# Patient Record
Sex: Female | Born: 1945 | Race: Black or African American | Hispanic: No | Marital: Married | Smoking: Never smoker
Health system: Southern US, Community
[De-identification: ages and names within clinical notes are randomized; demographics above are authoritative.]

## PROBLEM LIST (undated history)

## (undated) DIAGNOSIS — M545 Low back pain, unspecified: Secondary | ICD-10-CM

## (undated) DIAGNOSIS — K219 Gastro-esophageal reflux disease without esophagitis: Secondary | ICD-10-CM

## (undated) DIAGNOSIS — G2581 Restless legs syndrome: Secondary | ICD-10-CM

## (undated) DIAGNOSIS — E7800 Pure hypercholesterolemia, unspecified: Secondary | ICD-10-CM

## (undated) DIAGNOSIS — G4730 Sleep apnea, unspecified: Secondary | ICD-10-CM

## (undated) DIAGNOSIS — H409 Unspecified glaucoma: Secondary | ICD-10-CM

## (undated) DIAGNOSIS — R51 Headache: Secondary | ICD-10-CM

## (undated) DIAGNOSIS — M1990 Unspecified osteoarthritis, unspecified site: Secondary | ICD-10-CM

## (undated) DIAGNOSIS — R519 Headache, unspecified: Secondary | ICD-10-CM

## (undated) DIAGNOSIS — E669 Obesity, unspecified: Secondary | ICD-10-CM

## (undated) DIAGNOSIS — G4700 Insomnia, unspecified: Secondary | ICD-10-CM

## (undated) DIAGNOSIS — K635 Polyp of colon: Secondary | ICD-10-CM

## (undated) DIAGNOSIS — R7303 Prediabetes: Secondary | ICD-10-CM

## (undated) DIAGNOSIS — E039 Hypothyroidism, unspecified: Secondary | ICD-10-CM

## (undated) HISTORY — DX: Low back pain, unspecified: M54.50

## (undated) HISTORY — PX: BUNIONECTOMY: SHX129

## (undated) HISTORY — DX: Hypothyroidism, unspecified: E03.9

## (undated) HISTORY — PX: ANTERIOR CRUCIATE LIGAMENT REPAIR: SHX115

## (undated) HISTORY — PX: BREAST LUMPECTOMY: SHX2

## (undated) HISTORY — PX: TUBAL LIGATION: SHX77

## (undated) HISTORY — DX: Obesity, unspecified: E66.9

## (undated) HISTORY — PX: VAGINAL HYSTERECTOMY: SUR661

## (undated) HISTORY — DX: Unspecified osteoarthritis, unspecified site: M19.90

## (undated) HISTORY — PX: HIP SURGERY: SHX245

## (undated) HISTORY — DX: Polyp of colon: K63.5

## (undated) HISTORY — DX: Insomnia, unspecified: G47.00

## (undated) HISTORY — PX: THYROIDECTOMY: SHX17

## (undated) HISTORY — DX: Unspecified glaucoma: H40.9

## (undated) HISTORY — DX: Pure hypercholesterolemia, unspecified: E78.00

## (undated) HISTORY — PX: KNEE SURGERY: SHX244

## (undated) HISTORY — PX: TONSILLECTOMY: SUR1361

---
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
## 1979-03-26 HISTORY — PX: VAGINAL HYSTERECTOMY: SUR661

## 1997-12-21 ENCOUNTER — Ambulatory Visit (HOSPITAL_BASED_OUTPATIENT_CLINIC_OR_DEPARTMENT_OTHER): Admission: RE | Admit: 1997-12-21 | Discharge: 1997-12-21 | Payer: Self-pay | Admitting: Surgery

## 1999-01-25 ENCOUNTER — Encounter: Payer: Self-pay | Admitting: Internal Medicine

## 1999-01-25 ENCOUNTER — Encounter: Admission: RE | Admit: 1999-01-25 | Discharge: 1999-01-25 | Payer: Self-pay | Admitting: Family Medicine

## 1999-02-07 ENCOUNTER — Encounter: Admission: RE | Admit: 1999-02-07 | Discharge: 1999-02-07 | Payer: Self-pay | Admitting: Internal Medicine

## 1999-02-07 ENCOUNTER — Encounter: Payer: Self-pay | Admitting: Internal Medicine

## 2000-01-29 ENCOUNTER — Encounter: Payer: Self-pay | Admitting: Internal Medicine

## 2000-01-29 ENCOUNTER — Encounter: Admission: RE | Admit: 2000-01-29 | Discharge: 2000-01-29 | Payer: Self-pay | Admitting: Internal Medicine

## 2000-12-04 ENCOUNTER — Encounter: Admission: RE | Admit: 2000-12-04 | Discharge: 2000-12-04 | Payer: Self-pay | Admitting: Internal Medicine

## 2000-12-04 ENCOUNTER — Encounter: Payer: Self-pay | Admitting: Internal Medicine

## 2001-01-13 ENCOUNTER — Encounter: Payer: Self-pay | Admitting: Endocrinology

## 2001-01-13 ENCOUNTER — Encounter: Admission: RE | Admit: 2001-01-13 | Discharge: 2001-01-13 | Payer: Self-pay | Admitting: Endocrinology

## 2001-01-27 ENCOUNTER — Other Ambulatory Visit: Admission: RE | Admit: 2001-01-27 | Discharge: 2001-01-27 | Payer: Self-pay | Admitting: Endocrinology

## 2001-02-16 ENCOUNTER — Encounter: Admission: RE | Admit: 2001-02-16 | Discharge: 2001-02-16 | Payer: Self-pay | Admitting: Internal Medicine

## 2001-02-16 ENCOUNTER — Encounter: Payer: Self-pay | Admitting: Internal Medicine

## 2002-02-26 ENCOUNTER — Encounter: Admission: RE | Admit: 2002-02-26 | Discharge: 2002-02-26 | Payer: Self-pay | Admitting: Internal Medicine

## 2002-02-26 ENCOUNTER — Encounter: Payer: Self-pay | Admitting: Internal Medicine

## 2003-01-10 ENCOUNTER — Encounter: Payer: Self-pay | Admitting: Endocrinology

## 2003-01-10 ENCOUNTER — Encounter: Admission: RE | Admit: 2003-01-10 | Discharge: 2003-01-10 | Payer: Self-pay | Admitting: Endocrinology

## 2003-01-29 ENCOUNTER — Encounter: Admission: RE | Admit: 2003-01-29 | Discharge: 2003-01-29 | Payer: Self-pay | Admitting: Orthopedic Surgery

## 2003-02-28 ENCOUNTER — Encounter: Admission: RE | Admit: 2003-02-28 | Discharge: 2003-02-28 | Payer: Self-pay | Admitting: Internal Medicine

## 2004-01-23 ENCOUNTER — Encounter (INDEPENDENT_AMBULATORY_CARE_PROVIDER_SITE_OTHER): Payer: Self-pay | Admitting: Specialist

## 2004-01-23 ENCOUNTER — Ambulatory Visit (HOSPITAL_COMMUNITY): Admission: RE | Admit: 2004-01-23 | Discharge: 2004-01-23 | Payer: Self-pay | Admitting: Gastroenterology

## 2004-03-05 ENCOUNTER — Encounter: Admission: RE | Admit: 2004-03-05 | Discharge: 2004-03-05 | Payer: Self-pay | Admitting: Internal Medicine

## 2005-03-07 ENCOUNTER — Encounter: Admission: RE | Admit: 2005-03-07 | Discharge: 2005-03-07 | Payer: Self-pay | Admitting: Internal Medicine

## 2005-11-09 ENCOUNTER — Encounter: Admission: RE | Admit: 2005-11-09 | Discharge: 2005-11-09 | Payer: Self-pay | Admitting: Orthopedic Surgery

## 2005-11-22 ENCOUNTER — Ambulatory Visit (HOSPITAL_COMMUNITY): Admission: RE | Admit: 2005-11-22 | Discharge: 2005-11-22 | Payer: Self-pay | Admitting: Interventional Cardiology

## 2006-01-09 ENCOUNTER — Ambulatory Visit (HOSPITAL_BASED_OUTPATIENT_CLINIC_OR_DEPARTMENT_OTHER): Admission: RE | Admit: 2006-01-09 | Discharge: 2006-01-09 | Payer: Self-pay | Admitting: Orthopedic Surgery

## 2006-01-29 ENCOUNTER — Encounter: Admission: RE | Admit: 2006-01-29 | Discharge: 2006-01-29 | Payer: Self-pay | Admitting: Orthopedic Surgery

## 2006-03-11 ENCOUNTER — Encounter: Admission: RE | Admit: 2006-03-11 | Discharge: 2006-03-11 | Payer: Self-pay | Admitting: Internal Medicine

## 2006-12-01 ENCOUNTER — Ambulatory Visit: Payer: Self-pay | Admitting: Surgery

## 2006-12-04 ENCOUNTER — Encounter: Admission: RE | Admit: 2006-12-04 | Discharge: 2006-12-04 | Payer: Self-pay | Admitting: Endocrinology

## 2007-02-12 ENCOUNTER — Encounter (INDEPENDENT_AMBULATORY_CARE_PROVIDER_SITE_OTHER): Payer: Self-pay | Admitting: Interventional Radiology

## 2007-02-12 ENCOUNTER — Other Ambulatory Visit: Admission: RE | Admit: 2007-02-12 | Discharge: 2007-02-12 | Payer: Self-pay | Admitting: Interventional Radiology

## 2007-02-12 ENCOUNTER — Encounter: Admission: RE | Admit: 2007-02-12 | Discharge: 2007-02-12 | Payer: Self-pay | Admitting: Endocrinology

## 2007-03-13 ENCOUNTER — Encounter: Admission: RE | Admit: 2007-03-13 | Discharge: 2007-03-13 | Payer: Self-pay | Admitting: Internal Medicine

## 2008-03-14 ENCOUNTER — Encounter: Admission: RE | Admit: 2008-03-14 | Discharge: 2008-03-14 | Payer: Self-pay | Admitting: Internal Medicine

## 2008-12-19 ENCOUNTER — Encounter: Admission: RE | Admit: 2008-12-19 | Discharge: 2008-12-19 | Payer: Self-pay | Admitting: Endocrinology

## 2009-03-15 ENCOUNTER — Encounter: Admission: RE | Admit: 2009-03-15 | Discharge: 2009-03-15 | Payer: Self-pay | Admitting: Internal Medicine

## 2009-05-01 ENCOUNTER — Encounter (INDEPENDENT_AMBULATORY_CARE_PROVIDER_SITE_OTHER): Payer: Self-pay | Admitting: Surgery

## 2009-05-01 ENCOUNTER — Observation Stay (HOSPITAL_COMMUNITY): Admission: RE | Admit: 2009-05-01 | Discharge: 2009-05-02 | Payer: Self-pay | Admitting: Surgery

## 2010-03-15 ENCOUNTER — Encounter
Admission: RE | Admit: 2010-03-15 | Discharge: 2010-03-15 | Payer: Self-pay | Source: Home / Self Care | Attending: Orthopedic Surgery | Admitting: Orthopedic Surgery

## 2010-03-16 ENCOUNTER — Encounter
Admission: RE | Admit: 2010-03-16 | Discharge: 2010-03-16 | Payer: Self-pay | Source: Home / Self Care | Attending: Internal Medicine | Admitting: Internal Medicine

## 2010-03-25 HISTORY — PX: BUNIONECTOMY: SHX129

## 2010-05-25 ENCOUNTER — Other Ambulatory Visit: Payer: Self-pay | Admitting: Orthopaedic Surgery

## 2010-05-25 DIAGNOSIS — M549 Dorsalgia, unspecified: Secondary | ICD-10-CM

## 2010-05-29 ENCOUNTER — Ambulatory Visit
Admission: RE | Admit: 2010-05-29 | Discharge: 2010-05-29 | Disposition: A | Discharge status: Home / Self Care | Payer: MEDICARE | Source: Ambulatory Visit | Attending: Orthopaedic Surgery | Admitting: Orthopaedic Surgery

## 2010-05-29 DIAGNOSIS — M549 Dorsalgia, unspecified: Secondary | ICD-10-CM

## 2010-06-14 LAB — BASIC METABOLIC PANEL
BUN: 11 mg/dL (ref 6–23)
CO2: 28 mEq/L (ref 19–32)
Calcium: 10 mg/dL (ref 8.4–10.5)
Chloride: 105 mEq/L (ref 96–112)
Creatinine, Ser: 0.68 mg/dL (ref 0.4–1.2)
GFR calc Af Amer: >60 mL/min (ref >60)
GFR calc non Af Amer: >60 mL/min (ref >60)
Glucose, Bld: 89 mg/dL (ref 70–99)
Potassium: 4.5 mEq/L (ref 3.5–5.1)
Sodium: 139 mEq/L (ref 135–145)

## 2010-06-14 LAB — CBC
HCT: 43.1 % (ref 36.0–46.0)
Hemoglobin: 14.4 g/dL (ref 12.0–15.0)
MCHC: 33.4 g/dL (ref 30.0–36.0)
MCV: 86.8 fL (ref 78.0–100.0)
Platelets: 235 10*3/uL (ref 150–400)
RBC: 4.96 MIL/uL (ref 3.87–5.11)
RDW: 13.7 % (ref 11.5–15.5)
WBC: 5.7 10*3/uL (ref 4.0–10.5)

## 2010-06-14 LAB — DIFFERENTIAL
Basophils Absolute: 0 10*3/uL (ref 0.0–0.1)
Basophils Relative: 1 % (ref 0–1)
Eosinophils Absolute: 0.2 10*3/uL (ref 0.0–0.7)
Eosinophils Relative: 3 % (ref 0–5)
Lymphocytes Relative: 20 % (ref 12–46)
Lymphs Abs: 1.2 10*3/uL (ref 0.7–4.0)
Monocytes Absolute: 0.4 10*3/uL (ref 0.1–1.0)
Monocytes Relative: 7 % (ref 3–12)
Neutro Abs: 3.9 10*3/uL (ref 1.7–7.7)
Neutrophils Relative %: 70 % (ref 43–77)

## 2010-06-14 LAB — URINALYSIS, ROUTINE W REFLEX MICROSCOPIC
Bilirubin Urine: NEGATIVE
Glucose, UA: NEGATIVE mg/dL
Hgb urine dipstick: NEGATIVE
Ketones, ur: NEGATIVE mg/dL
Nitrite: NEGATIVE
Protein, ur: NEGATIVE mg/dL
Specific Gravity, Urine: 1.018 (ref 1.005–1.030)
Urobilinogen, UA: 1 mg/dL (ref 0.0–1.0)
pH: 6 (ref 5.0–8.0)

## 2010-06-14 LAB — PROTIME-INR
INR: 1.01 (ref 0.00–1.49)
Prothrombin Time: 13.2 seconds (ref 11.6–15.2)

## 2010-06-14 LAB — CALCIUM
Calcium: 9.2 mg/dL (ref 8.4–10.5)
Calcium: 9.3 mg/dL (ref 8.4–10.5)

## 2010-06-14 LAB — URINE MICROSCOPIC-ADD ON

## 2010-08-07 NOTE — Procedures (Signed)
DUPLEX DEEP VENOUS EXAM - LOWER EXTREMITY   INDICATION:  Left lower extremity calf pain and swelling for about 1  week.   HISTORY:  Edema:  No  Trauma/Surgery:  Left knee surgery last year, right knee about 6 years  ago.  Pain:  Left lower extremity.  PE:  No  Previous DVT:  No.  Anticoagulants:  No.   DUPLEX EXAM:                CFV   SFV   PopV  PTV    GSV                R  L  R  L  R  L  R   L  R  L  Thrombosis    o  o     o     o      o     o  Spontaneous   +  +     +     +      +     +  Phasic        +  +     +     +      +     +  Augmentation  +  +     +     +      +     +  Compressible  +  +     +     +      +     +  Competent     +  +     +     +      +     +   Legend:  + - yes  o - no  p - partial  D - decreased   IMPRESSION:  The left lower extremity deep venous system was imaged,  dopplered and shows no evidence of DVT.   The contralateral common femoral vein shows no evidence of DVT.    _____________________________  Janetta Hora Darrick Penna, M.D.   AS/MEDQ  D:  12/01/2006  T:  12/02/2006  Job:  [id number]

## 2010-08-10 NOTE — Op Note (Signed)
Elizabeth Barron, Elizabeth Barron                 ACCOUNT NO.:  [id number]   MEDICAL RECORD NO.:  [id number]          PATIENT TYPE:  AMB   LOCATION:  ENDO                         FACILITY:  [facility]   PHYSICIAN:  Danise Edge, M.D.   DATE OF BIRTH:  14-Aug-1945   DATE OF PROCEDURE:  01/23/2004  DATE OF DISCHARGE:                                 OPERATIVE REPORT   PROCEDURE:  Screening colonoscopy.   PROCEDURE INDICATIONS:  Ms. Pearson Reasons is a 65 year old female scheduled to  undergo her first screening colonoscopy with polypectomy to prevent colon  cancer.   ENDOSCOPIST:  Danise Edge, M.D.   PREMEDICATION:  1.  Versed 10 mg.  2.  Demerol 100 mg.   PROCEDURE:  After obtaining informed consent, Ms. Erion was placed in the  left lateral decubitus position.  I administered intravenous Demerol and  intravenous Versed to achieve conscious sedation for the procedure.  The  patient's blood pressure, oxygen saturation, and cardiac rhythm were  monitored throughout the procedure and documented in the medical record.   Anal inspection and digital rectal exam were normal.  The Olympus adjustable  pediatric colonoscope was introduced into the rectum and advanced to the  cecum.  Colonic preparation for the exam today was excellent.   Rectum normal.   Sigmoid colon and descending colon normal.   Splenic flexure normal.   Transverse colon normal.   Hepatic flexure.  A 1 mm sessile polyp was removed from the hepatic flexure  with the cold biopsy forceps.   Ascending colon normal.   Cecum and ileocecal valve normal.   ASSESSMENT:  A diminutive polyp was removed from the hepatic flexure.  Otherwise normal proctocolonoscopy to the cecum.      MJ/MEDQ  D:  01/23/2004  T:  01/23/2004  Job:  [id number]   cc:   Ike Bene, M.D.  [address]  [place]  [place] [address]  Fax: [phone]

## 2010-08-10 NOTE — Op Note (Signed)
NAMEEUSTOLIA, DRENNEN                 ACCOUNT NO.:  [id number]   MEDICAL RECORD NO.:  [id number]          PATIENT TYPE:  AMB   LOCATION:  DSC                          FACILITY:  [facility]   PHYSICIAN:  Rodney A. Mortenson, M.D.DATE OF BIRTH:  12/23/1945   DATE OF PROCEDURE:  01/09/2006  DATE OF DISCHARGE:                                 OPERATIVE REPORT   PREOPERATIVE DIAGNOSES:  1. Probable internal derangement, left knee.  2. Osteoarthritis, medial compartment, left knee.   POSTOPERATIVE DIAGNOSES:  Osteoarthritis, medial compartment, left knee;  severe chondromalacia, left patella; fraying and tearing of the posterior  horn of the medial meniscus and the leading edge of the lateral meniscus in  the posterior third.   OPERATION:  Arthroscopy; chondroplasty of patella and femoral notch area and  medial femoral condyle; partial debridement, medial and lateral meniscus,  left knee.   SURGEON:  Lenard Galloway. Chaney Malling, M.D.   ANESTHESIA:  MAC.   JUSTIFICATION FOR PROCEDURE:  A 65 year old female, who has had trouble with  her left knee since October 2006.  No history of injury.  She has had  several injections and has seen no real improvement.  She has recurrent pain  along the medial joint line.  Flexion of the knee does cause pain and  discomfort.  A/P standing weightbearing films show partial collapse of the  medial compartment.  No loose bodies are seen.  An MRI of the knee was done  and no obvious tears were seen of the menisci, but there were degenerative  changes about the knee, worse in the medial compartment.  Because of  persistent pain and discomfort, it was felt that arthroscopic evaluation and  treatment were indicated and necessary.  We had long discussions  preoperatively.  No guarantee could be given obviously.  Complications were  discussed.  Because of persistent pain and discomfort, the patient wished to  have exploratory arthroscopy in the hope that we will find a  treatable  lesion that will positively affect the outcome of this knee and long-term  use of the knee.   JUSTIFICATION FOR OUTPATIENT SURGERY:  Minimal morbidity.   PATHOLOGY:  With the arthroscope in the knee, a very careful examination of  the knee was undertaken.  The patellofemoral joint was visualized first.  There was  fair amount of fraying and tearing of the articular cartilage on  the posterior aspect of the patella.  The anterior femur had grade 2  cartilage changes.  The ACL was normal.  In the lateral compartment, there  were marked fraying and tearing of the posterior horn and posterior  attachment of the lateral meniscus, and this involved primarily the leading  edge.  The articular cartilage over the lateral femoral condyle and the  lateral tibial plateau appeared fairly normal.  In the medial compartment,  there was an area of grade 2 cartilage damage over the weightbearing area of  the medial femoral condyle, but the tibial plateau appeared fairly normal.  There was fraying and tearing of the leading edge of the medial meniscus,  starting at the posterior  attachment and extending to the junction between  the mid and posterior third of the medial meniscus.  This fibers could be  elevated and lifted with a probe.   PROCEDURE:  The patient was placed on the operating table in supine position  with a pneumatic tourniquet about the left upper thigh.  The entire left  upper extremity was prepped with DuraPrep and draped out in the usual  manner.  Marcaine had been placed in the knee, and Xylocaine and epinephrine  used to infiltrate the puncture wounds.  An infusion cannula was placed  through the superomedial puncture and the knee distended with saline.  Anteromedial and lateral portals were made and the arthroscope was  introduced.  Attention was first turned to the patellofemoral junction.  Using the full-radius resector, the posterior aspect of the patella was  debrided  to smooth, stable cartilage.  The arthroscope was then passed into  the lateral compartment.  The leading edge of the posterior horn was frayed  and torn, and this was debrided through both portals with various size  baskets.  Once this was accomplished to my satisfaction, the intra-articular  shaver was introduced.  All debris was removed.  The remaining rim was then  smoothed and balanced with a nice transition to the midthird of the lateral  meniscus.   Attention then turned to the medial compartment.  There were marked fraying  and tearing of the posterior attachment of the medial meniscus, and this was  debrided with several baskets through both portals.  Once this was debrided  to where there was a smooth, stable rim, the intra-articular shaver was  introduced and all debris was removed.  The rim was then smooth and balanced  with a nice transition with the midthird of the medial meniscus.  There were  fraying and tearing over the weightbearing area of the medial femoral  condyle, and a smooth chondroplasty shaver was used to debride the loose  cartilage in this area.  The knee was infiltrated with Marcaine.  A large  bulky pressure dressing was applied.  The patient returned to the recovery  room in excellent condition.  Technically, I was very pleased with the  surgical outcome.   DISPOSITION:  1. Mepergan Fortis for pain.  2. To my office on Wednesday.  3. Usual postoperative instructions were discussed with the patient and      friend.           ______________________________  Lenard Galloway. Chaney Malling, M.D.     RAM/MEDQ  D:  01/09/2006  T:  01/10/2006  Job:  [id number]

## 2011-02-19 ENCOUNTER — Other Ambulatory Visit: Payer: Self-pay | Admitting: Internal Medicine

## 2011-02-19 DIAGNOSIS — Z1231 Encounter for screening mammogram for malignant neoplasm of breast: Secondary | ICD-10-CM

## 2011-03-20 ENCOUNTER — Ambulatory Visit
Admission: RE | Admit: 2011-03-20 | Discharge: 2011-03-20 | Disposition: A | Discharge status: Home / Self Care | Payer: Medicare Other | Source: Ambulatory Visit | Attending: Internal Medicine | Admitting: Internal Medicine

## 2011-03-20 DIAGNOSIS — Z1231 Encounter for screening mammogram for malignant neoplasm of breast: Secondary | ICD-10-CM

## 2011-09-25 ENCOUNTER — Other Ambulatory Visit: Payer: Self-pay | Admitting: Orthopedic Surgery

## 2011-09-25 DIAGNOSIS — M545 Low back pain: Secondary | ICD-10-CM

## 2011-10-02 ENCOUNTER — Other Ambulatory Visit: Payer: Medicare Other

## 2011-10-09 ENCOUNTER — Other Ambulatory Visit: Payer: Medicare Other

## 2011-10-13 ENCOUNTER — Other Ambulatory Visit: Payer: Medicare Other

## 2011-11-18 ENCOUNTER — Institutional Professional Consult (permissible substitution): Payer: Medicare Other | Admitting: Internal Medicine

## 2011-11-27 ENCOUNTER — Ambulatory Visit (INDEPENDENT_AMBULATORY_CARE_PROVIDER_SITE_OTHER): Payer: Medicare Other | Admitting: Internal Medicine

## 2011-11-27 ENCOUNTER — Encounter: Payer: Self-pay | Admitting: Internal Medicine

## 2011-11-27 VITALS — BP 124/78 | HR 71 | Temp 97.6°F | Ht 62.5 in | Wt 234.0 lb

## 2011-11-27 DIAGNOSIS — R05 Cough: Secondary | ICD-10-CM

## 2011-11-27 MED ORDER — PANTOPRAZOLE SODIUM 40 MG PO TBEC: 40.0000 mg | DELAYED_RELEASE_TABLET | Freq: Every day | ORAL | Status: DC

## 2011-11-27 NOTE — Patient Instructions (Addendum)
Try prilosec 20mg  (pantoprazole 40 mg) Take 30-60 min before first meal of the day and Pepcid 20 mg one bedtime for at least a month until cough is completely gone for at least a week without the need for cough suppression.  GERD (REFLUX)  is an extremely common cause of respiratory symptoms(especically cough), many times with no significant heartburn at all.    It can be treated with medication, but also with lifestyle changes including avoidance of late meals, excessive alcohol, smoking cessation, and avoid fatty foods, chocolate, peppermint, colas, red wine, and acidic juices such as orange juice.  NO MINT OR MENTHOL PRODUCTS SO NO COUGH DROPS  USE SUGARLESS CANDY INSTEAD (jolley ranchers or Stover's)  NO OIL BASED VITAMINS - use powdered substitutes.

## 2011-11-27 NOTE — Progress Notes (Signed)
Subjective:    Patient ID: Elizabeth Barron, female    DOB: 09-Mar-1946  MRN: [id number]  HPI  21 yobf never smoker never respiratory problems until April 2013 p pneumovax at physical started coughing and couldn't stop so referred 11/27/2011 to pulmonary clinic by Dr Polite's PA.   11/27/2011 1st pulmonary eval cc acute onset persistent cough since April 2013 75% improved by day of ov s pattern x some worse when eating assoc with mild dysphagia> tsp - tbsp clear phlegm No obvious daytime variabilty or assoc chronic cough or cp or chest tightness, subjective wheeze overt sinus or hb symptoms. No unusual exp hx or h/o childhood pna/ asthma or premature birth to his knowledge.    Rx multiple abx , inhlaers, prednisone x 2,  Last med was nexium stopped it mid August p 15 days no worse when stopped so didn't think it really helped but note 75% overall improvement in cough    Kouffman Reflux v Neurogenic Cough Differentiator Reflux Comments  Do you awaken from a sound sleep coughing violently?                            With trouble breathing? yes   Do you have choking episodes when you cannot  Get enough air, gasping for air ?              Yes    Do you usually cough when you lie down into  The bed, or when you just lie down to rest ?                          no   Do you usually cough after meals or eating?         No    Do you cough when (or after) you bend over?    no   GERD SCORE     Kouffman Reflux v Neurogenic Cough Differentiator Neurogenic   Do you more-or-less cough all day long? yes   Does change of temperature make you cough? yes   Does laughing or chuckling cause you to cough? yes   Do fumes (perfume, automobile fumes, burned  Toast, etc.,) cause you to cough ?      no   Does speaking, singing, or talking on the phone cause you to cough   ?               No    Neurogenic/Airway score         Review of Systems  Constitutional: Negative for fever, chills, diaphoresis, activity  change, appetite change, fatigue and unexpected weight change.  HENT: Positive for trouble swallowing and voice change. Negative for nosebleeds, congestion, sore throat, rhinorrhea, sneezing, dental problem, postnasal drip, sinus pressure, tinnitus and ear discharge.   Eyes: Negative for photophobia, discharge, itching and visual disturbance.  Respiratory: Positive for cough and choking. Negative for chest tightness, shortness of breath and wheezing.   Cardiovascular: Negative for chest pain and palpitations.  Gastrointestinal: Positive for vomiting. Negative for nausea, abdominal pain and constipation.  Genitourinary: Negative for difficulty urinating.  Musculoskeletal: Positive for back pain. Negative for myalgias, joint swelling, arthralgias and gait problem.  Skin: Negative for rash.  Neurological: Negative for dizziness, tremors, seizures, syncope, speech difficulty, weakness, light-headedness, numbness and headaches.  Hematological: Does not bruise/bleed easily.  Psychiatric/Behavioral: Negative for confusion, disturbed wake/sleep cycle and agitation. The patient is not nervous/anxious.  Objective:   Physical Exam  Obese amb bf nad Wt 234 11/27/11 HEENT: nl dentition, turbinates, and orophanx. Nl external ear canals without cough reflex   NECK :  without JVD/Nodes/TM/ nl carotid upstrokes bilaterally   LUNGS: no acc muscle use, clear to A and P bilaterally without cough on insp or exp maneuvers   CV:  RRR  no s3 or murmur or increase in P2, no edema   ABD:  soft and nontender with nl excursion in the supine position. No bruits or organomegaly, bowel sounds nl  MS:  warm without deformities, calf tenderness, cyanosis or clubbing  SKIN: warm and dry without lesions    NEURO:  alert, approp, no deficits    10/14/11 cxr Mild bibasilar atx      Assessment & Plan:

## 2011-11-28 DIAGNOSIS — R05 Cough: Secondary | ICD-10-CM | POA: Insufficient documentation

## 2011-11-28 NOTE — Assessment & Plan Note (Signed)
The most common causes of chronic cough in immunocompetent adults include the following: upper airway cough syndrome (UACS), previously referred to as postnasal drip syndrome (PNDS), which is caused by variety of rhinosinus conditions; (2) asthma; (3) GERD; (4) chronic bronchitis from cigarette smoking or other inhaled environmental irritants; (5) nonasthmatic eosinophilic bronchitis; and (6) bronchiectasis.   These conditions, singly or in combination, have accounted for up to 94% of the causes of chronic cough in prospective studies.   Other conditions have constituted no >6% of the causes in prospective studies These have included bronchogenic carcinoma, chronic interstitial pneumonia, sarcoidosis, left ventricular failure, ACEI-induced cough, and aspiration from a condition associated with pharyngeal dysfunction.   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   These are the same pts (now being labeled as having "irritable larynx syndrome" by some cough centers) who not infrequently have a history of having failed to tolerate ace inhibitors,  dry powder inhalers or biphosphonates or report having atypical reflux symptoms that don't respond to standard doses of PPI , and are easily confused as having aecopd or asthma flares by even experienced allergists/ pulmonologists.  Reviewed with pt:  The standardized cough guidelines recently published in Chest by [place] in 2006  are a multiple step process (up to 12!) , not a single office visit,  and are intended  to  address this problem logically,  with an alogrithm dependent on response to empiric treatment at  each progressive step  to determine a specific diagnosis with  minimal addtional testing needed. Therefore if compliance is an issue or can't be accurately verified then it's very unlikely the standard evaluation and treatment will be successful here.    Furthermore, response to therapy (other than acute cough suppression, which should only be used short term with avoidance of narcotic containing cough syrups if possible), can be a gradual process for which the patient may not receive immediate benefit.  Unlike going to an eye doctor where the right rx is almost always the first one and is immediately effective, this is almost never the case in the management of chronic cough syndromes and the patient needs to commit up front to compliance with recommendations and have the patience to wait out a response for up to 6 weeks of therapy directed at the likely underlying problem(s).

## 2012-01-27 ENCOUNTER — Ambulatory Visit
Admission: RE | Admit: 2012-01-27 | Discharge: 2012-01-27 | Disposition: A | Discharge status: Home / Self Care | Payer: Medicare Other | Source: Ambulatory Visit | Attending: Orthopedic Surgery | Admitting: Orthopedic Surgery

## 2012-01-27 DIAGNOSIS — M545 Low back pain: Secondary | ICD-10-CM

## 2012-01-29 ENCOUNTER — Other Ambulatory Visit: Payer: Self-pay | Admitting: Orthopedic Surgery

## 2012-01-29 DIAGNOSIS — M47819 Spondylosis without myelopathy or radiculopathy, site unspecified: Secondary | ICD-10-CM

## 2012-01-30 ENCOUNTER — Ambulatory Visit
Admission: RE | Admit: 2012-01-30 | Discharge: 2012-01-30 | Disposition: A | Discharge status: Home / Self Care | Payer: Medicare Other | Source: Ambulatory Visit | Attending: Orthopedic Surgery | Admitting: Orthopedic Surgery

## 2012-01-30 DIAGNOSIS — M47819 Spondylosis without myelopathy or radiculopathy, site unspecified: Secondary | ICD-10-CM

## 2012-01-30 MED ORDER — IOHEXOL 180 MG/ML  SOLN
1.0000 mL | Freq: Once | INTRAMUSCULAR | Status: AC | PRN
  Administered 2012-01-30: 1 mL via EPIDURAL

## 2012-01-30 MED ORDER — METHYLPREDNISOLONE ACETATE 40 MG/ML INJ SUSP (RADIOLOG
120.0000 mg | Freq: Once | INTRAMUSCULAR | Status: AC
  Administered 2012-01-30: 120 mg via EPIDURAL

## 2012-02-03 ENCOUNTER — Telehealth: Payer: Self-pay | Admitting: Radiology

## 2012-02-03 NOTE — Telephone Encounter (Signed)
Pt called because of no change in her pain post injection. Explained that she needed a full 7 days to tell if injection was going to work. Will call physician for increase in post meds.dd

## 2012-02-06 ENCOUNTER — Emergency Department (INDEPENDENT_AMBULATORY_CARE_PROVIDER_SITE_OTHER)
Admission: EM | Admit: 2012-02-06 | Discharge: 2012-02-06 | Disposition: A | Discharge status: Home / Self Care | Payer: Medicare Other | Source: Home / Self Care | Attending: Emergency Medicine | Admitting: Emergency Medicine

## 2012-02-06 ENCOUNTER — Other Ambulatory Visit: Payer: Self-pay | Admitting: Orthopedic Surgery

## 2012-02-06 ENCOUNTER — Telehealth: Payer: Self-pay | Admitting: Radiology

## 2012-02-06 ENCOUNTER — Encounter (HOSPITAL_COMMUNITY): Payer: Self-pay | Admitting: *Deleted

## 2012-02-06 DIAGNOSIS — M5430 Sciatica, unspecified side: Secondary | ICD-10-CM

## 2012-02-06 DIAGNOSIS — M5410 Radiculopathy, site unspecified: Secondary | ICD-10-CM

## 2012-02-06 MED ORDER — KETOROLAC TROMETHAMINE 60 MG/2ML IM SOLN
60.0000 mg | Freq: Once | INTRAMUSCULAR | Status: AC
  Administered 2012-02-06: 60 mg via INTRAMUSCULAR

## 2012-02-06 MED ORDER — MELOXICAM 7.5 MG PO TABS: 7.5000 mg | ORAL_TABLET | Freq: Every day | ORAL | Status: DC

## 2012-02-06 MED ORDER — KETOROLAC TROMETHAMINE 60 MG/2ML IM SOLN
INTRAMUSCULAR | Status: AC
  Filled 2012-02-06: qty 2

## 2012-02-06 MED ORDER — ONDANSETRON 4 MG PO TBDP
ORAL_TABLET | ORAL | Status: AC
  Filled 2012-02-06: qty 2

## 2012-02-06 MED ORDER — PREDNISONE 5 MG PO KIT: 1.0000 | PACK | Freq: Every day | ORAL | Status: DC

## 2012-02-06 MED ORDER — ONDANSETRON 4 MG PO TBDP
8.0000 mg | ORAL_TABLET | Freq: Once | ORAL | Status: AC
  Administered 2012-02-06: 8 mg via ORAL

## 2012-02-06 MED ORDER — OXYCODONE-ACETAMINOPHEN 5-325 MG PO TABS: ORAL_TABLET | ORAL | Status: DC

## 2012-02-06 MED ORDER — HYDROMORPHONE HCL PF 1 MG/ML IJ SOLN
1.0000 mg | Freq: Once | INTRAMUSCULAR | Status: AC
  Administered 2012-02-06: 1 mg via INTRAMUSCULAR

## 2012-02-06 MED ORDER — HYDROMORPHONE HCL PF 1 MG/ML IJ SOLN
INTRAMUSCULAR | Status: AC
Start: 2012-02-06 — End: 2012-02-06
  Filled 2012-02-06: qty 1

## 2012-02-06 NOTE — ED Provider Notes (Signed)
Chief Complaint  Patient presents with  . Hip Pain    History of Present Illness:   Elizabeth Barron is a 66 year old female who has had a three-week history of pain which begins in the right hip and radiates into her groin and down the entire right leg. The patient states this is sharp, stabbing pain rated 9/10 in intensity. It's worse if she walks and not better with anything. She has to use a crutch. The leg feels numb and tingly and the leg feels weak. She is seeing Dr. Lajoyce Corners for this. He ordered an MRI scan which she had done last week and this showed some facet joint arthritis but no bulging or herniated disc. She was given an epidural steroid injection week ago but this failed to help. Dr. Lajoyce Corners has been prescribing hydrocodone and tramadol for the pain, but it's not helping much. She has taken Percocet before and this caused some itching but no rash or anaphylactic-like symptoms. She denies any dysuria, frequency, urgency, or incontinence of urine and she's had no incontinence of stool or saddle anesthesia. She denies any fever, chills, or unintended weight loss. She has no history of cancer. Her only other medical illnesses been a thyroidectomy.  Review of Systems:  Other than noted above, the patient denies any of the following symptoms: Systemic:  No fevers, chills, sweats, or aches.  No fatigue or tiredness. Musculoskeletal:  No joint pain, arthritis, bursitis, swelling, back pain, or neck pain. Neurological:  No muscular weakness, paresthesias, headache, or trouble with speech or coordination.  No dizziness.  PMFSH:  Past medical history, family history, social history, meds, and allergies were reviewed.  Physical Exam:   Vital signs:  BP 138/78  Pulse 66  Temp 98.4 F (36.9 C) (Oral)  Resp 22  SpO2 98% Gen:  Alert and oriented times 3.  In no distress. Musculoskeletal: She has no pain to palpation in her lower back, buttock, over the greater trochanter, or in the groin. Her hip has a full  range of motion with no pain. Straight leg raising was positive on the right with a positive Lasegue's sign and positive popliteal compression. Otherwise, all joints had a full a ROM with no swelling, bruising or deformity.  No edema, pulses full. Extremities were warm and pink.  Capillary refill was brisk.  Skin:  Clear, warm and dry.  No rash. Neuro:  Alert and oriented times 3.  Muscle strength was normal.  Sensation was intact to light touch.   Course in Urgent Care Center:   She was given tramadol 60 mg IM, Dilaudid 1 mg IM, and Zofran ODT 8 mg by mouth and tolerated these well without any immediate side effects.  Assessment:  The encounter diagnosis was Sciatica.  Plan:   1.  The following meds were prescribed:   New Prescriptions   MELOXICAM (MOBIC) 7.5 MG TABLET    Take 1 tablet (7.5 mg total) by mouth daily.   OXYCODONE-ACETAMINOPHEN (PERCOCET) 5-325 MG PER TABLET    1 to 2 tablets every 6 hours as needed for pain.   PREDNISONE 5 MG KIT    Take 1 kit (5 mg total) by mouth daily after breakfast. Prednisone 5 mg 6 day dosepack.  Take as directed.   2.  The patient was instructed in symptomatic care, including rest and activity, elevation, application of ice and compression.  Appropriate handouts were given. 3.  The patient was told to return if becoming worse in any way, if no better in  3 or 4 days, and given some red flag symptoms that would indicate earlier return.   4.  The patient was told to follow up with Dr. Lajoyce Corners in one week.   Reuben Likes, MD 02/06/12 2150

## 2012-02-06 NOTE — ED Notes (Signed)
C/o pain R groin and radiates down R leg onset 3 days ago.  Saw Dr. Lajoyce Corners and he gave her Hydrocodone 5/325, then Tramadol, then Hydrocodone 5/500 mg. without relief.  Had an injection in her back at [facility] on 11/7 without relief.  She is scheduled for another one next week.

## 2012-02-06 NOTE — Telephone Encounter (Signed)
Pt wanted 2nd injection and was upset that she needed to wait 14 days between injections because of the steroids. Pt was given to Danielle to schedule for Thursday or Friday of next week. dd

## 2012-02-12 ENCOUNTER — Other Ambulatory Visit: Payer: Self-pay | Admitting: Orthopedic Surgery

## 2012-02-12 ENCOUNTER — Ambulatory Visit
Admission: RE | Admit: 2012-02-12 | Discharge: 2012-02-12 | Disposition: A | Discharge status: Home / Self Care | Payer: Medicare Other | Source: Ambulatory Visit | Attending: Orthopedic Surgery | Admitting: Orthopedic Surgery

## 2012-02-12 DIAGNOSIS — M5410 Radiculopathy, site unspecified: Secondary | ICD-10-CM

## 2012-02-12 MED ORDER — IOHEXOL 180 MG/ML  SOLN: 1.0000 mL | Freq: Once | INTRAMUSCULAR | Status: AC | PRN

## 2012-02-12 MED ORDER — METHYLPREDNISOLONE ACETATE 40 MG/ML INJ SUSP (RADIOLOG: 120.0000 mg | Freq: Once | INTRAMUSCULAR | Status: DC

## 2012-02-13 ENCOUNTER — Other Ambulatory Visit: Payer: Self-pay | Admitting: Internal Medicine

## 2012-02-13 DIAGNOSIS — Z1231 Encounter for screening mammogram for malignant neoplasm of breast: Secondary | ICD-10-CM

## 2012-03-02 ENCOUNTER — Other Ambulatory Visit: Payer: Self-pay | Admitting: Orthopedic Surgery

## 2012-03-02 DIAGNOSIS — M5410 Radiculopathy, site unspecified: Secondary | ICD-10-CM

## 2012-03-09 ENCOUNTER — Other Ambulatory Visit: Payer: Medicare Other

## 2012-03-09 ENCOUNTER — Ambulatory Visit
Admission: RE | Admit: 2012-03-09 | Discharge: 2012-03-09 | Disposition: A | Discharge status: Home / Self Care | Payer: Medicare Other | Source: Ambulatory Visit | Attending: Orthopedic Surgery | Admitting: Orthopedic Surgery

## 2012-03-09 VITALS — BP 138/82 | HR 58

## 2012-03-09 DIAGNOSIS — M5410 Radiculopathy, site unspecified: Secondary | ICD-10-CM

## 2012-03-09 DIAGNOSIS — M47816 Spondylosis without myelopathy or radiculopathy, lumbar region: Secondary | ICD-10-CM

## 2012-03-09 MED ORDER — IOHEXOL 180 MG/ML  SOLN
1.0000 mL | Freq: Once | INTRAMUSCULAR | Status: AC | PRN
  Administered 2012-03-09: 1 mL via INTRA_ARTICULAR

## 2012-03-09 MED ORDER — METHYLPREDNISOLONE ACETATE 40 MG/ML INJ SUSP (RADIOLOG
120.0000 mg | Freq: Once | INTRAMUSCULAR | Status: AC
  Administered 2012-03-09: 120 mg via INTRA_ARTICULAR

## 2012-03-23 ENCOUNTER — Ambulatory Visit
Admission: RE | Admit: 2012-03-23 | Discharge: 2012-03-23 | Disposition: A | Discharge status: Home / Self Care | Payer: Medicare Other | Source: Ambulatory Visit | Attending: Internal Medicine | Admitting: Internal Medicine

## 2012-03-23 DIAGNOSIS — Z1231 Encounter for screening mammogram for malignant neoplasm of breast: Secondary | ICD-10-CM

## 2012-03-24 ENCOUNTER — Other Ambulatory Visit: Payer: Self-pay | Admitting: Orthopaedic Surgery

## 2012-03-24 DIAGNOSIS — R1030 Lower abdominal pain, unspecified: Secondary | ICD-10-CM

## 2012-03-26 ENCOUNTER — Ambulatory Visit
Admission: RE | Admit: 2012-03-26 | Discharge: 2012-03-26 | Disposition: A | Discharge status: Home / Self Care | Payer: Medicare Other | Source: Ambulatory Visit | Attending: Orthopaedic Surgery | Admitting: Orthopaedic Surgery

## 2012-03-26 DIAGNOSIS — R1030 Lower abdominal pain, unspecified: Secondary | ICD-10-CM

## 2012-03-30 ENCOUNTER — Other Ambulatory Visit: Payer: Self-pay | Admitting: Internal Medicine

## 2012-03-30 DIAGNOSIS — R928 Other abnormal and inconclusive findings on diagnostic imaging of breast: Secondary | ICD-10-CM

## 2012-04-01 ENCOUNTER — Other Ambulatory Visit: Payer: Self-pay | Admitting: Orthopaedic Surgery

## 2012-04-01 DIAGNOSIS — M25551 Pain in right hip: Secondary | ICD-10-CM

## 2012-04-02 ENCOUNTER — Other Ambulatory Visit: Payer: Self-pay | Admitting: Internal Medicine

## 2012-04-02 ENCOUNTER — Ambulatory Visit
Admission: RE | Admit: 2012-04-02 | Discharge: 2012-04-02 | Disposition: A | Discharge status: Home / Self Care | Payer: Medicare Other | Source: Ambulatory Visit | Attending: Internal Medicine | Admitting: Internal Medicine

## 2012-04-02 DIAGNOSIS — R921 Mammographic calcification found on diagnostic imaging of breast: Secondary | ICD-10-CM

## 2012-04-02 DIAGNOSIS — R928 Other abnormal and inconclusive findings on diagnostic imaging of breast: Secondary | ICD-10-CM

## 2012-04-09 ENCOUNTER — Ambulatory Visit
Admission: RE | Admit: 2012-04-09 | Discharge: 2012-04-09 | Disposition: A | Discharge status: Home / Self Care | Payer: Medicare Other | Source: Ambulatory Visit | Attending: Orthopaedic Surgery | Admitting: Orthopaedic Surgery

## 2012-04-09 DIAGNOSIS — M25551 Pain in right hip: Secondary | ICD-10-CM

## 2012-04-09 MED ORDER — TRIAMCINOLONE ACETONIDE 40 MG/ML IJ SUSP (RADIOLOGY)
40.0000 mg | Freq: Once | INTRAMUSCULAR | Status: AC
  Administered 2012-04-09: 40 mg via INTRA_ARTICULAR

## 2012-04-09 MED ORDER — IOHEXOL 180 MG/ML  SOLN
1.0000 mL | Freq: Once | INTRAMUSCULAR | Status: AC | PRN
  Administered 2012-04-09: 1 mL via INTRA_ARTICULAR

## 2012-04-16 ENCOUNTER — Ambulatory Visit
Admission: RE | Admit: 2012-04-16 | Discharge: 2012-04-16 | Disposition: A | Discharge status: Home / Self Care | Payer: Medicare Other | Source: Ambulatory Visit | Attending: Internal Medicine | Admitting: Internal Medicine

## 2012-04-16 DIAGNOSIS — R921 Mammographic calcification found on diagnostic imaging of breast: Secondary | ICD-10-CM

## 2012-11-24 ENCOUNTER — Emergency Department (HOSPITAL_COMMUNITY)
Admission: EM | Admit: 2012-11-24 | Discharge: 2012-11-24 | Disposition: A | Discharge status: Home / Self Care | Payer: Medicare Other | Source: Home / Self Care | Attending: Family Medicine | Admitting: Family Medicine

## 2012-11-24 ENCOUNTER — Encounter (HOSPITAL_COMMUNITY): Payer: Self-pay | Admitting: Emergency Medicine

## 2012-11-24 DIAGNOSIS — S335XXA Sprain of ligaments of lumbar spine, initial encounter: Secondary | ICD-10-CM

## 2012-11-24 DIAGNOSIS — S39012A Strain of muscle, fascia and tendon of lower back, initial encounter: Secondary | ICD-10-CM

## 2012-11-24 LAB — POCT URINALYSIS DIP (DEVICE)
Bilirubin Urine: NEGATIVE
Glucose, UA: NEGATIVE mg/dL
Hgb urine dipstick: NEGATIVE
Ketones, ur: NEGATIVE mg/dL
Leukocytes, UA: NEGATIVE
Nitrite: NEGATIVE
Protein, ur: NEGATIVE mg/dL
Specific Gravity, Urine: 1.015 (ref 1.005–1.030)
Urobilinogen, UA: 0.2 mg/dL (ref 0.0–1.0)
pH: 5.5 (ref 5.0–8.0)

## 2012-11-24 MED ORDER — METAXALONE 800 MG PO TABS: 800.0000 mg | ORAL_TABLET | Freq: Three times a day (TID) | ORAL | Status: DC

## 2012-11-24 MED ORDER — DICLOFENAC POTASSIUM 50 MG PO TABS: 50.0000 mg | ORAL_TABLET | Freq: Three times a day (TID) | ORAL | Status: DC

## 2012-11-24 NOTE — ED Notes (Signed)
Patient reports left flank pain, onset Sunday.  No pain with urination, last bm yesterday and normal per patient.  Patient reports feels the need to urinate, but only a small amount.

## 2012-11-24 NOTE — ED Notes (Signed)
Patient in bathroom

## 2012-11-24 NOTE — ED Notes (Signed)
Patient care delay secondary to emergency patient in department

## 2012-11-24 NOTE — ED Provider Notes (Signed)
CSN: [id number]     Arrival date & time 11/24/12  4098 History   First MD Initiated Contact with Patient 11/24/12 1011     Chief Complaint  Patient presents with  . Flank Pain   (Consider location/radiation/quality/duration/timing/severity/associated sxs/prior Treatment) Patient is a 67 y.o. female presenting with flank pain. The history is provided by the patient.  Flank Pain This is a new problem. The current episode started 2 days ago. The problem occurs constantly. The problem has not changed since onset.Pertinent negatives include no chest pain, no abdominal pain and no shortness of breath. The symptoms are aggravated by bending and exertion. The symptoms are relieved by position.    Past Medical History  Diagnosis Date  . Obesity   . Glaucoma   . Hypothyroidism   . Hypercholesteremia   . Insomnia   . Anemia     Pt. states she is not aware of ever being anemic  . Colon polyps    Past Surgical History  Procedure Laterality Date  . Thyroidectomy    . Knee surgery    . Bunionectomy    . Vaginal hysterectomy    . Breast lumpectomy      L breast- benign   Family History  Problem Relation Age of Onset  . Cancer Mother     pancreatic  . Cancer Father     esophageal  . Diabetes Mother   . Diabetes Father   . Hypertension Mother   . Hypertension Father    History  Substance Use Topics  . Smoking status: Never Smoker   . Smokeless tobacco: Never Used  . Alcohol Use: Yes     Comment: OCCASIONAL WINE   OB History   Grav Para Term Preterm Abortions TAB SAB Ect Mult Living                 Review of Systems  Constitutional: Negative.   Respiratory: Negative for shortness of breath.   Cardiovascular: Negative for chest pain.  Gastrointestinal: Negative for nausea, vomiting, abdominal pain and diarrhea.  Genitourinary: Positive for flank pain. Negative for dysuria, urgency, hematuria, vaginal bleeding and vaginal discharge.  Musculoskeletal: Positive for back pain.  Negative for gait problem.  Skin: Negative.  Negative for rash.    Allergies  Percocet  Home Medications   Current Outpatient Rx  Name  Route  Sig  Dispense  Refill  . aspirin 81 MG tablet   Oral   Take 81 mg by mouth daily.         . Calcium Citrate-Vitamin D (CALCIUM CITRATE +D PO)   Oral   Take 1 tablet by mouth daily.         . diclofenac (CATAFLAM) 50 MG tablet   Oral   Take 1 tablet (50 mg total) by mouth 3 (three) times daily. For back pain   30 tablet   0   . HYDROcodone-acetaminophen (VICODIN) 5-500 MG per tablet   Oral   Take 1 tablet by mouth every 6 (six) hours as needed.         Marland Kitchen levothyroxine (SYNTHROID, LEVOTHROID) 175 MCG tablet   Oral   Take 175 mcg by mouth Daily.         . meloxicam (MOBIC) 7.5 MG tablet   Oral   Take 1 tablet (7.5 mg total) by mouth daily.   15 tablet   0   . metaxalone (SKELAXIN) 800 MG tablet   Oral   Take 1 tablet (800 mg total) by  mouth 3 (three) times daily.   21 tablet   0   . oxyCODONE-acetaminophen (PERCOCET) 5-325 MG per tablet      1 to 2 tablets every 6 hours as needed for pain.   20 tablet   0   . pantoprazole (PROTONIX) 40 MG tablet   Oral   Take 1 tablet (40 mg total) by mouth daily. Take 30-60 min before first meal of the day   30 tablet   2   . PredniSONE 5 MG KIT   Oral   Take 1 kit (5 mg total) by mouth daily after breakfast. Prednisone 5 mg 6 day dosepack.  Take as directed.   1 kit   0    BP 149/79  Pulse 62  Temp(Src) 97.6 F (36.4 C) (Oral)  Resp 18  SpO2 98% Physical Exam  Nursing note and vitals reviewed. Constitutional: She is oriented to person, place, and time. She appears well-developed and well-nourished.  Abdominal: Soft. Bowel sounds are normal. She exhibits no distension and no mass. There is no tenderness. There is no rebound and no guarding.  Musculoskeletal: She exhibits tenderness.       Lumbar back: She exhibits tenderness and spasm. She exhibits normal range  of motion, no bony tenderness, no swelling, no edema and normal pulse.       Back:  Neurological: She is alert and oriented to person, place, and time.  Skin: Skin is warm and dry.    ED Course  Procedures (including critical care time) Labs Review Labs Reviewed  POCT URINALYSIS DIP (DEVICE)   Imaging Review No results found.  MDM   1. Low back strain, initial encounter    U/a neg.    Linna Hoff, MD 11/24/12 1201

## 2013-02-24 ENCOUNTER — Other Ambulatory Visit: Payer: Self-pay

## 2013-02-24 DIAGNOSIS — Z1231 Encounter for screening mammogram for malignant neoplasm of breast: Secondary | ICD-10-CM

## 2013-04-02 ENCOUNTER — Ambulatory Visit
Admission: RE | Admit: 2013-04-02 | Discharge: 2013-04-02 | Disposition: A | Discharge status: Home / Self Care | Payer: Self-pay | Source: Ambulatory Visit

## 2013-04-02 DIAGNOSIS — Z1231 Encounter for screening mammogram for malignant neoplasm of breast: Secondary | ICD-10-CM

## 2013-05-11 ENCOUNTER — Other Ambulatory Visit: Payer: Self-pay | Admitting: Orthopedic Surgery

## 2013-05-11 DIAGNOSIS — M25551 Pain in right hip: Secondary | ICD-10-CM

## 2013-05-12 ENCOUNTER — Other Ambulatory Visit: Payer: Medicare Other

## 2013-05-25 ENCOUNTER — Ambulatory Visit
Admission: RE | Admit: 2013-05-25 | Discharge: 2013-05-25 | Disposition: A | Discharge status: Home / Self Care | Payer: Medicare Other | Source: Ambulatory Visit | Attending: Orthopedic Surgery | Admitting: Orthopedic Surgery

## 2013-05-25 DIAGNOSIS — M25551 Pain in right hip: Secondary | ICD-10-CM

## 2013-05-25 MED ORDER — METHYLPREDNISOLONE ACETATE 40 MG/ML INJ SUSP (RADIOLOG
120.0000 mg | Freq: Once | INTRAMUSCULAR | Status: AC
  Administered 2013-05-25: 120 mg via INTRA_ARTICULAR

## 2013-05-25 MED ORDER — IOHEXOL 180 MG/ML  SOLN
1.0000 mL | Freq: Once | INTRAMUSCULAR | Status: AC | PRN
  Administered 2013-05-25: 1 mL via INTRA_ARTICULAR

## 2013-05-25 NOTE — Progress Notes (Signed)
Pt had a hip injection this am. And is now c/o some cramping and aching. Explained that the steroid is very irritating and this was not unusual. Asked the pt to use ice to the area and take pain meds as needed and to allow the hip to rest today.

## 2013-11-03 ENCOUNTER — Other Ambulatory Visit: Payer: Self-pay | Admitting: Internal Medicine

## 2013-11-03 DIAGNOSIS — R1900 Intra-abdominal and pelvic swelling, mass and lump, unspecified site: Secondary | ICD-10-CM

## 2013-11-04 ENCOUNTER — Ambulatory Visit
Admission: RE | Admit: 2013-11-04 | Discharge: 2013-11-04 | Disposition: A | Discharge status: Home / Self Care | Payer: Medicare Other | Source: Ambulatory Visit | Attending: Internal Medicine | Admitting: Internal Medicine

## 2013-11-04 ENCOUNTER — Other Ambulatory Visit: Payer: Self-pay | Admitting: Internal Medicine

## 2013-11-04 DIAGNOSIS — R079 Chest pain, unspecified: Secondary | ICD-10-CM

## 2013-11-04 DIAGNOSIS — R1900 Intra-abdominal and pelvic swelling, mass and lump, unspecified site: Secondary | ICD-10-CM

## 2013-12-09 ENCOUNTER — Other Ambulatory Visit: Payer: Self-pay | Admitting: Neurological Surgery

## 2013-12-09 DIAGNOSIS — M545 Low back pain, unspecified: Secondary | ICD-10-CM

## 2013-12-14 ENCOUNTER — Ambulatory Visit
Admission: RE | Admit: 2013-12-14 | Discharge: 2013-12-14 | Disposition: A | Discharge status: Home / Self Care | Payer: Medicare Other | Source: Ambulatory Visit | Attending: Neurological Surgery | Admitting: Neurological Surgery

## 2013-12-14 DIAGNOSIS — M545 Low back pain, unspecified: Secondary | ICD-10-CM

## 2014-02-28 ENCOUNTER — Other Ambulatory Visit: Payer: Self-pay

## 2014-02-28 DIAGNOSIS — Z1231 Encounter for screening mammogram for malignant neoplasm of breast: Secondary | ICD-10-CM

## 2014-04-04 ENCOUNTER — Ambulatory Visit
Admission: RE | Admit: 2014-04-04 | Discharge: 2014-04-04 | Disposition: A | Discharge status: Home / Self Care | Payer: Medicare Other | Source: Ambulatory Visit

## 2014-04-04 DIAGNOSIS — Z1231 Encounter for screening mammogram for malignant neoplasm of breast: Secondary | ICD-10-CM

## 2014-04-20 ENCOUNTER — Ambulatory Visit (INDEPENDENT_AMBULATORY_CARE_PROVIDER_SITE_OTHER): Payer: Medicare Other | Admitting: Podiatry

## 2014-04-20 ENCOUNTER — Encounter: Payer: Self-pay | Admitting: Podiatry

## 2014-04-20 ENCOUNTER — Ambulatory Visit (INDEPENDENT_AMBULATORY_CARE_PROVIDER_SITE_OTHER): Payer: Medicare Other

## 2014-04-20 VITALS — BP 123/72 | HR 78 | Resp 12

## 2014-04-20 DIAGNOSIS — R52 Pain, unspecified: Secondary | ICD-10-CM

## 2014-04-20 DIAGNOSIS — S92911A Unspecified fracture of right toe(s), initial encounter for closed fracture: Secondary | ICD-10-CM

## 2014-04-20 NOTE — Patient Instructions (Signed)

## 2014-04-20 NOTE — Progress Notes (Signed)
   Subjective:    Patient ID: Elizabeth Barron, female    DOB: 08/04/1945, 69 y.o.   MRN: [id number]  HPI  N-SORE L-RT FOOT 1ST, 2ND, 3RD TOE AND BALL OF THE FOOT. D-2 WEEKS O-SUDDENLY C-WORSE A-WALKING T-EPSON SALT  She describes hitting right foot against the door resulting in a painful right forefoot. She says that she has is limping when she is walking in an athletic style shoe and the pain is increasing over time  Review of Systems  Musculoskeletal: Positive for joint swelling and gait problem.  All other systems reviewed and are negative.      Objective:   Physical Exam Orientated 3  Vascular: DP pulses 2/4 bilaterally PT pulses 2/4 bilaterally Low-grade edema dorsal second to fourth toe right  Neurological: Sensation to 10 g monofilament wire intact 5/5 bilaterally Ankle reflex equal and reactive bilaterally Vibratory sensation intact bilaterally  Dermatological: Well-healed surgical scar left first MPJ Texture and turgor within normal limits bilaterally  Musculoskeletal: Patient has painful gait favoring right foot Exquisite tenderness base of proximal phalanx third right toe to palpation without a palpable lesions HAV deformity right   X-ray examination right foot  Pes planus Posterior and inferior calcaneal spurs Fracture proximal phalanx third toe without displacement  Radiographic impression: Nondisplaced fracture of the proximal phalanx, third right toe     Assessment & Plan:   Assessment: Satisfactory neurovascular status Nondisplaced fracture proximal phalanx third toe right foot  Plan: I advised patient that she had a fracture in the third right toe I dispensed a surgical shoe to wear in the right foot until pain subsided  Reappoint at patient's request

## 2014-04-25 ENCOUNTER — Ambulatory Visit: Payer: Medicare Other | Admitting: Podiatry

## 2014-09-28 ENCOUNTER — Other Ambulatory Visit: Payer: Self-pay | Admitting: Gastroenterology

## 2014-10-05 ENCOUNTER — Encounter (HOSPITAL_COMMUNITY): Payer: Self-pay | Admitting: *Deleted

## 2014-10-10 ENCOUNTER — Other Ambulatory Visit: Payer: Self-pay | Admitting: Gastroenterology

## 2014-10-11 ENCOUNTER — Ambulatory Visit (HOSPITAL_COMMUNITY): Payer: Medicare Other | Admitting: Anesthesiology

## 2014-10-11 ENCOUNTER — Encounter (HOSPITAL_COMMUNITY): Payer: Self-pay

## 2014-10-11 ENCOUNTER — Encounter (HOSPITAL_COMMUNITY)
Admission: RE | Disposition: A | Discharge status: Home / Self Care | Payer: Self-pay | Source: Ambulatory Visit | Attending: Gastroenterology

## 2014-10-11 ENCOUNTER — Ambulatory Visit (HOSPITAL_COMMUNITY)
Admission: RE | Admit: 2014-10-11 | Discharge: 2014-10-11 | Disposition: A | Discharge status: Home / Self Care | Payer: Medicare Other | Source: Ambulatory Visit | Attending: Gastroenterology | Admitting: Gastroenterology

## 2014-10-11 DIAGNOSIS — D122 Benign neoplasm of ascending colon: Secondary | ICD-10-CM | POA: Diagnosis not present

## 2014-10-11 DIAGNOSIS — Z86010 Personal history of colonic polyps: Secondary | ICD-10-CM | POA: Diagnosis not present

## 2014-10-11 DIAGNOSIS — Z6841 Body Mass Index (BMI) 40.0 and over, adult: Secondary | ICD-10-CM | POA: Diagnosis not present

## 2014-10-11 DIAGNOSIS — H409 Unspecified glaucoma: Secondary | ICD-10-CM | POA: Diagnosis not present

## 2014-10-11 DIAGNOSIS — Z09 Encounter for follow-up examination after completed treatment for conditions other than malignant neoplasm: Secondary | ICD-10-CM | POA: Diagnosis present

## 2014-10-11 DIAGNOSIS — E890 Postprocedural hypothyroidism: Secondary | ICD-10-CM | POA: Insufficient documentation

## 2014-10-11 DIAGNOSIS — M1990 Unspecified osteoarthritis, unspecified site: Secondary | ICD-10-CM | POA: Diagnosis not present

## 2014-10-11 HISTORY — PX: COLONOSCOPY WITH PROPOFOL: SHX5780

## 2014-10-11 HISTORY — DX: Unspecified osteoarthritis, unspecified site: M19.90

## 2014-10-11 SURGERY — COLONOSCOPY WITH PROPOFOL
Anesthesia: Monitor Anesthesia Care

## 2014-10-11 MED ORDER — PHENYLEPHRINE 40 MCG/ML (10ML) SYRINGE FOR IV PUSH (FOR BLOOD PRESSURE SUPPORT)
PREFILLED_SYRINGE | INTRAVENOUS | Status: AC
  Filled 2014-10-11: qty 10

## 2014-10-11 MED ORDER — SODIUM CHLORIDE 0.9 % IV SOLN: INTRAVENOUS | Status: DC

## 2014-10-11 MED ORDER — LIDOCAINE HCL (CARDIAC) 20 MG/ML IV SOLN
INTRAVENOUS | Status: DC | PRN
  Administered 2014-10-11: 50 mg via INTRAVENOUS

## 2014-10-11 MED ORDER — LIDOCAINE HCL (CARDIAC) 20 MG/ML IV SOLN
INTRAVENOUS | Status: AC
  Filled 2014-10-11: qty 5

## 2014-10-11 MED ORDER — PHENYLEPHRINE HCL 10 MG/ML IJ SOLN
INTRAMUSCULAR | Status: DC | PRN
  Administered 2014-10-11: 80 ug via INTRAVENOUS

## 2014-10-11 MED ORDER — PROPOFOL INFUSION 10 MG/ML OPTIME
INTRAVENOUS | Status: DC | PRN
  Administered 2014-10-11: 200 ug/kg/min via INTRAVENOUS

## 2014-10-11 MED ORDER — LACTATED RINGERS IV SOLN
INTRAVENOUS | Status: DC
  Administered 2014-10-11: 1000 mL via INTRAVENOUS
  Administered 2014-10-11: 09:00:00 via INTRAVENOUS

## 2014-10-11 MED ORDER — PROPOFOL 10 MG/ML IV BOLUS
INTRAVENOUS | Status: AC
  Filled 2014-10-11: qty 20

## 2014-10-11 SURGICAL SUPPLY — 22 items

## 2014-10-11 NOTE — Op Note (Signed)
Procedure: Surveillance colonoscopy. 08/08/2009 colonoscopy performed with removal of 3 small adenomatous colon polyps  Endoscopist: Earle Gell  Premedication: Propofol by anesthesia  Procedure: The patient was placed in the left lateral decubitus position. Anal inspection and digital rectal exam were normal. The Pentax pediatric colonoscope was introduced into the rectum and advanced to the cecum. A normal-appearing appendiceal orifice was identified. A normal-appearing ileocecal valve was intubated and the terminal ileum inspected. Colonic preparation for the exam today was good. With all time was 15 minutes  Rectum. Normal. Retroflex view of the distal rectum was normal  Sigmoid colon and descending colon. Normal  Splenic flexure. Normal  Transverse colon. Normal  Hepatic flexure. Normal  Ascending colon. Two diminutive sessile polyps were removed with the cold biopsy forceps  Cecum and ileocecal valve. Normal  Terminal ileum. Normal  Assessment: Two diminutive sessile polyps were removed from the ascending colon with the cold biopsy forceps. Otherwise normal colonoscopy  Recommendation: Schedule repeat surveillance colonoscopy in 5 years

## 2014-10-11 NOTE — Anesthesia Preprocedure Evaluation (Signed)
Anesthesia Evaluation  Patient identified by MRN, date of birth, ID band Patient awake    Reviewed: Allergy & Precautions, NPO status , Patient's Chart, lab work & pertinent test results  History of Anesthesia Complications Negative for: history of anesthetic complications  Airway Mallampati: I       Dental  (+) Teeth Intact   Pulmonary neg pulmonary ROS,  breath sounds clear to auscultation        Cardiovascular negative cardio ROS  Rhythm:Regular Rate:Normal     Neuro/Psych    GI/Hepatic negative GI ROS, Neg liver ROS,   Endo/Other  Hypothyroidism Morbid obesity  Renal/GU negative Renal ROS     Musculoskeletal  (+) Arthritis -,   Abdominal (+) + obese,   Peds  Hematology   Anesthesia Other Findings   Reproductive/Obstetrics                             Anesthesia Physical Anesthesia Plan  ASA: II  Anesthesia Plan: MAC   Post-op Pain Management:    Induction: Intravenous  Airway Management Planned: Natural Airway  Additional Equipment:   Intra-op Plan:   Post-operative Plan:   Informed Consent: I have reviewed the patients History and Physical, chart, labs and discussed the procedure including the risks, benefits and alternatives for the proposed anesthesia with the patient or authorized representative who has indicated his/her understanding and acceptance.     Plan Discussed with:   Anesthesia Plan Comments:         Anesthesia Quick Evaluation

## 2014-10-11 NOTE — Discharge Instructions (Signed)
Colonoscopy, Care After °These instructions give you information on caring for yourself after your procedure. Your doctor may also give you more specific instructions. Call your doctor if you have any problems or questions after your procedure. °HOME CARE °· Do not drive for 24 hours. °· Do not sign important papers or use machinery for 24 hours. °· You may shower. °· You may go back to your usual activities, but go slower for the first 24 hours. °· Take rest breaks often during the first 24 hours. °· Walk around or use warm packs on your belly (abdomen) if you have belly cramping or gas. °· Drink enough fluids to keep your pee (urine) clear or pale yellow. °· Resume your normal diet. Avoid heavy or fried foods. °· Avoid drinking alcohol for 24 hours or as told by your doctor. °· Only take medicines as told by your doctor. °If a tissue sample (biopsy) was taken during the procedure:  °· Do not take aspirin or blood thinners for 7 days, or as told by your doctor. °· Do not drink alcohol for 7 days, or as told by your doctor. °· Eat soft foods for the first 24 hours. °GET HELP IF: °You still have a small amount of blood in your poop (stool) 2-3 days after the procedure. °GET HELP RIGHT AWAY IF: °· You have more than a small amount of blood in your poop. °· You see clumps of tissue (blood clots) in your poop. °· Your belly is puffy (swollen). °· You feel sick to your stomach (nauseous) or throw up (vomit). °· You have a fever. °· You have belly pain that gets worse and medicine does not help. °MAKE SURE YOU: °· Understand these instructions. °· Will watch your condition. °· Will get help right away if you are not doing well or get worse. °Document Released: 04/13/2010 Document Revised: 03/16/2013 Document Reviewed: 11/16/2012 °[employer]® Patient Information ©2015 [employer]. This information is not intended to replace advice given to you by your health care provider. Make sure you discuss any questions you have with  your health care provider. ° °

## 2014-10-11 NOTE — H&P (Signed)
  Procedure: Surveillance colonoscopy. 08/08/2009 colonoscopy performed with removal of 3 small adenomatous colon polyps  History: The patient is a 69 year old female born 12-30-1945. She is scheduled to undergo a surveillance colonoscopy today.  Medication allergies: None  Past medical history: Glaucoma. Hypothyroidism. Hypercholesterolemia. Left on a surgery for vitreous tear. Right thyroid nodule biopsy. Hysterectomy. Tonsillectomy. Bilateral knee arthroscopy. Thyroidectomy.  Exam: Patient is alert and lying comfortably on the endoscopy stretcher. Abdomen is soft and nontender to palpation. Lungs are clear to auscultation. Cardiac exam reveals a regular rhythm.  Plan: Proceed with surveillance colonoscopy

## 2014-10-11 NOTE — Anesthesia Postprocedure Evaluation (Signed)
  Anesthesia Post-op Note  Patient: Elizabeth Barron  Procedure(s) Performed: Procedure(s): COLONOSCOPY WITH PROPOFOL (N/A)  Patient Location: Endoscopy Unit  Anesthesia Type:MAC  Level of Consciousness: awake and alert   Airway and Oxygen Therapy: Patient Spontanous Breathing  Post-op Pain: none  Post-op Assessment: Post-op Vital signs reviewed              Post-op Vital Signs: stable  Last Vitals:  Filed Vitals:   10/11/14 1035  BP: 147/78  Pulse: 71  Temp:   Resp: 19    Complications: No apparent anesthesia complications

## 2014-10-11 NOTE — Transfer of Care (Signed)
Immediate Anesthesia Transfer of Care Note  Patient: Elizabeth Barron  Procedure(s) Performed: Procedure(s): COLONOSCOPY WITH PROPOFOL (N/A)  Patient Location: PACU  Anesthesia Type:MAC  Level of Consciousness:  sedated, patient cooperative and responds to stimulation  Airway & Oxygen Therapy:Patient Spontanous Breathing and Patient connected to face mask oxgen  Post-op Assessment:  Report given to PACU RN and Post -op Vital signs reviewed and stable  Post vital signs:  Reviewed and stable  Last Vitals:  Filed Vitals:   10/11/14 0852  BP: 126/91  Pulse: 72  Temp: 36.5 C  Resp: 16    Complications: No apparent anesthesia complications

## 2014-10-13 ENCOUNTER — Encounter (HOSPITAL_COMMUNITY): Payer: Self-pay | Admitting: Gastroenterology

## 2014-10-17 ENCOUNTER — Encounter (HOSPITAL_COMMUNITY): Payer: Self-pay | Admitting: Emergency Medicine

## 2014-10-17 ENCOUNTER — Emergency Department (HOSPITAL_COMMUNITY)
Admission: EM | Admit: 2014-10-17 | Discharge: 2014-10-17 | Disposition: A | Discharge status: Home / Self Care | Payer: Medicare Other | Source: Home / Self Care | Attending: Emergency Medicine | Admitting: Emergency Medicine

## 2014-10-17 DIAGNOSIS — M5416 Radiculopathy, lumbar region: Principal | ICD-10-CM

## 2014-10-17 DIAGNOSIS — G8929 Other chronic pain: Secondary | ICD-10-CM | POA: Diagnosis not present

## 2014-10-17 DIAGNOSIS — M6580 Other synovitis and tenosynovitis, unspecified site: Secondary | ICD-10-CM

## 2014-10-17 DIAGNOSIS — M5410 Radiculopathy, site unspecified: Secondary | ICD-10-CM

## 2014-10-17 DIAGNOSIS — M76899 Other specified enthesopathies of unspecified lower limb, excluding foot: Secondary | ICD-10-CM

## 2014-10-17 DIAGNOSIS — M79604 Pain in right leg: Secondary | ICD-10-CM | POA: Diagnosis not present

## 2014-10-17 MED ORDER — PREDNISONE 20 MG PO TABS: ORAL_TABLET | ORAL | Status: DC

## 2014-10-17 MED ORDER — DICLOFENAC SODIUM 1 % TD GEL: 1.0000 "application " | Freq: Four times a day (QID) | TRANSDERMAL | Status: DC

## 2014-10-17 MED ORDER — TRAMADOL HCL 50 MG PO TABS: 50.0000 mg | ORAL_TABLET | Freq: Four times a day (QID) | ORAL | Status: DC | PRN

## 2014-10-17 NOTE — ED Provider Notes (Signed)
CSN: [id number]     Arrival date & time 10/17/14  1906 History   First MD Initiated Contact with Patient 10/17/14 2057     Chief Complaint  Patient presents with  . Leg Pain   (Consider location/radiation/quality/duration/timing/severity/associated sxs/prior Treatment) HPI Comments: 69 year old morbidly obese female complaining of pain in her right leg. There are 2 types and locations of pain. They both started yesterday. Patient has a long history of chronic back and radiculopathy of symptoms going down the right side of the leg. This flareup began yesterday. Her other complaint is that of pain with weightbearing to the lateral femoral condyle. There is no pain while sitting. Initially no tenderness.   Past Medical History  Diagnosis Date  . Obesity   . Glaucoma   . Hypothyroidism   . Hypercholesteremia   . Insomnia   . Anemia     Pt. states she is not aware of ever being anemic  . Colon polyps   . Arthritis     knees. back   Past Surgical History  Procedure Laterality Date  . Thyroidectomy    . Knee surgery      bilateral scope surgery  . Bunionectomy Bilateral   . Vaginal hysterectomy    . Breast lumpectomy      L breast- benign  . Tonsillectomy      child  . Tubal ligation    . Colonoscopy with propofol N/A 10/11/2014    Procedure: COLONOSCOPY WITH PROPOFOL;  Surgeon: Garlan Fair, MD;  Location: [facility];  Service: Endoscopy;  Laterality: N/A;   Family History  Problem Relation Age of Onset  . Cancer Mother     pancreatic  . Cancer Father     esophageal  . Diabetes Mother   . Diabetes Father   . Hypertension Mother   . Hypertension Father    History  Substance Use Topics  . Smoking status: Never Smoker   . Smokeless tobacco: Never Used  . Alcohol Use: Yes     Comment: OCCASIONAL WINE   OB History    No data available     Review of Systems  Constitutional: Positive for activity change. Negative for fever and fatigue.  HENT: Negative.    Respiratory: Negative.   Cardiovascular: Negative.   Gastrointestinal: Negative.   Musculoskeletal: Positive for arthralgias and gait problem. Negative for neck pain.  Skin: Negative.     Allergies  Percocet  Home Medications   Prior to Admission medications   Medication Sig Start Date End Date Taking? Authorizing Provider  aspirin 81 MG tablet Take 81 mg by mouth daily.    Historical Provider, MD  brinzolamide (AZOPT) 1 % ophthalmic suspension Place 1 drop into both eyes 2 (two) times daily.    Historical Provider, MD  diclofenac sodium (VOLTAREN) 1 % GEL Apply 1 application topically 4 (four) times daily. 10/17/14   Janne Napoleon, NP  HYDROcodone-acetaminophen (VICODIN) 5-500 MG per tablet Take 1 tablet by mouth every 6 (six) hours as needed for pain.     Historical Provider, MD  levothyroxine (SYNTHROID, LEVOTHROID) 175 MCG tablet Take 175 mcg by mouth Daily. 10/22/11   Historical Provider, MD  predniSONE (DELTASONE) 20 MG tablet 3 Tabs PO Days 1-3, then 2 tabs PO Days 4-6, then 1 tab PO Day 7-9, then Half Tab PO Day 10-12 10/17/14   Janne Napoleon, NP  traMADol (ULTRAM) 50 MG tablet Take 1 tablet (50 mg total) by mouth every 6 (six) hours as needed. 10/17/14  Janne Napoleon, NP  traZODone (DESYREL) 100 MG tablet Take 100 mg by mouth at bedtime as needed for sleep.    Historical Provider, MD  Vitamin D, Ergocalciferol, (DRISDOL) 50000 UNITS CAPS capsule Take 50,000 Units by mouth every 7 (seven) days.    Historical Provider, MD   BP 129/90 mmHg  Pulse 65  Temp(Src) 97.8 F (36.6 C) (Oral)  Resp 18  SpO2 95% Physical Exam  Constitutional: She appears well-developed and well-nourished. No distress.  Neck: Normal range of motion. Neck supple.  Pulmonary/Chest: Effort normal. No respiratory distress.  Musculoskeletal: She exhibits no edema.  Right knee exam without obvious swelling or deformity. The patient points to the right lateral femoral condyle as the source of pain when she stands.  Palpation does not reproduce pain nor does palpation of the other areas of her knee. Distal neurovascular and motor sensory is intact.  Neurological: She is alert. She exhibits normal muscle tone.  Skin: Skin is warm and dry.  Psychiatric: She has a normal mood and affect.  Nursing note and vitals reviewed.   ED Course  Procedures (including critical care time) Labs Review Labs Reviewed - No data to display  Imaging Review No results found.   MDM   1. Chronic radicular low back pain   2. Right leg pain   3. Knee tendinitis    Ice locally to knee Diclofenac gel Prednisone taper Limit wt bearing for a few days See our PCP as needed    Janne Napoleon, NP 10/17/14 2150

## 2014-10-17 NOTE — Discharge Instructions (Signed)
Chronic Back Pain  When back pain lasts longer than 3 months, it is called chronic back pain.People with chronic back pain often go through certain periods that are more intense (flare-ups).  CAUSES Chronic back pain can be caused by wear and tear (degeneration) on different structures in your back. These structures include:  The bones of your spine (vertebrae) and the joints surrounding your spinal cord and nerve roots (facets).  The strong, fibrous tissues that connect your vertebrae (ligaments). Degeneration of these structures may result in pressure on your nerves. This can lead to constant pain. HOME CARE INSTRUCTIONS  Avoid bending, heavy lifting, prolonged sitting, and activities which make the problem worse.  Take brief periods of rest throughout the day to reduce your pain. Lying down or standing usually is better than sitting while you are resting.  Take over-the-counter or prescription medicines only as directed by your caregiver. SEEK IMMEDIATE MEDICAL CARE IF:   You have weakness or numbness in one of your legs or feet.  You have trouble controlling your bladder or bowels.  You have nausea, vomiting, abdominal pain, shortness of breath, or fainting. Document Released: 04/18/2004 Document Revised: 06/03/2011 Document Reviewed: 02/23/2011 [facility] Patient Information 2015 [place], [place]. This information is not intended to replace advice given to you by your health care provider. Make sure you discuss any questions you have with your health care provider.  Lumbosacral Radiculopathy Lumbosacral radiculopathy is a pinched nerve or nerves in the low back (lumbosacral area). When this happens you may have weakness in your legs and may not be able to stand on your toes. You may have pain going down into your legs. There may be difficulties with walking normally. There are many causes of this problem. Sometimes this may happen from an injury, or simply from arthritis or boney  problems. It may also be caused by other illnesses such as diabetes. If there is no improvement after treatment, further studies may be done to find the exact cause. DIAGNOSIS  X-rays may be needed if the problems become long standing. Electromyograms may be done. This study is one in which the working of nerves and muscles is studied. HOME CARE INSTRUCTIONS   Applications of ice packs may be helpful. Ice can be used in a plastic bag with a towel around it to prevent frostbite to skin. This may be used every 2 hours for 20 to 30 minutes, or as needed, while awake, or as directed by your caregiver.  Only take over-the-counter or prescription medicines for pain, discomfort, or fever as directed by your caregiver.  If physical therapy was prescribed, follow your caregiver's directions. SEEK IMMEDIATE MEDICAL CARE IF:   You have pain not controlled with medications.  You seem to be getting worse rather than better.  You develop increasing weakness in your legs.  You develop loss of bowel or bladder control.  You have difficulty with walking or balance, or develop clumsiness in the use of your legs.  You have a fever. MAKE SURE YOU:   Understand these instructions.  Will watch your condition.  Will get help right away if you are not doing well or get worse. Document Released: 03/11/2005 Document Revised: 06/03/2011 Document Reviewed: 10/30/2007 [facility] Patient Information 2015 [place], [place]. This information is not intended to replace advice given to you by your health care provider. Make sure you discuss any questions you have with your health care provider.  Tendinitis Tendinitis is swelling and inflammation of the tendons. Tendons are band-like tissues  that connect muscle to bone. Tendinitis commonly occurs in the:   Shoulders (rotator cuff).  Heels (Achilles tendon).  Elbows (triceps tendon).  knees CAUSES Tendinitis is usually caused by overusing the tendon, muscles,  and joints involved. When the tissue surrounding a tendon (synovium) becomes inflamed, it is called tenosynovitis. Tendinitis commonly develops in people whose jobs require repetitive motions, sudden movement or prolonged weight bearing. SYMPTOMS  Pain.  Tenderness.  Mild swelling. DIAGNOSIS Tendinitis is usually diagnosed by physical exam. Your health care provider may also order X-rays or other imaging tests. TREATMENT Your health care provider may recommend certain medicines or exercises for your treatment. HOME CARE INSTRUCTIONS   Use a sling or splint for as long as directed by your health care provider until the pain decreases.  Put ice on the injured area.  Put ice in a plastic bag.  Place a towel between your skin and the bag.  Leave the ice on for 15-20 minutes, 3-4 times a day, or as directed by your health care provider.  Avoid using the limb while the tendon is painful. Perform gentle range of motion exercises only as directed by your health care provider. Stop exercises if pain or discomfort increase, unless directed otherwise by your health care provider.  Only take over-the-counter or prescription medicines for pain, discomfort, or fever as directed by your health care provider. SEEK MEDICAL CARE IF:   Your pain and swelling increase.  You develop new, unexplained symptoms, especially increased numbness in the hands. MAKE SURE YOU:   Understand these instructions.  Will watch your condition.  Will get help right away if you are not doing well or get worse. Document Released: 03/08/2000 Document Revised: 07/26/2013 Document Reviewed: 05/28/2010 [facility] Patient Information 2015 [place], [place]. This information is not intended to replace advice given to you by your health care provider. Make sure you discuss any questions you have with your health care provider.

## 2014-10-17 NOTE — ED Notes (Signed)
Reports chronic back pain and intermittent pain in right leg, also has intermittent numbness.  Patient reports pain below right knee cap.  No known injury.  No pain while sitting, but with weight bearing, pain is significant.

## 2014-12-14 ENCOUNTER — Other Ambulatory Visit: Payer: Self-pay | Admitting: Internal Medicine

## 2014-12-14 ENCOUNTER — Ambulatory Visit
Admission: RE | Admit: 2014-12-14 | Discharge: 2014-12-14 | Disposition: A | Discharge status: Home / Self Care | Payer: Medicare Other | Source: Ambulatory Visit | Attending: Internal Medicine | Admitting: Internal Medicine

## 2014-12-14 DIAGNOSIS — M25512 Pain in left shoulder: Secondary | ICD-10-CM

## 2014-12-14 DIAGNOSIS — M542 Cervicalgia: Secondary | ICD-10-CM

## 2015-02-28 ENCOUNTER — Other Ambulatory Visit: Payer: Self-pay

## 2015-02-28 DIAGNOSIS — Z1231 Encounter for screening mammogram for malignant neoplasm of breast: Secondary | ICD-10-CM

## 2015-04-06 ENCOUNTER — Ambulatory Visit
Admission: RE | Admit: 2015-04-06 | Discharge: 2015-04-06 | Disposition: A | Discharge status: Home / Self Care | Payer: Medicare Other | Source: Ambulatory Visit

## 2015-04-06 DIAGNOSIS — Z1231 Encounter for screening mammogram for malignant neoplasm of breast: Secondary | ICD-10-CM

## 2015-04-18 ENCOUNTER — Ambulatory Visit (INDEPENDENT_AMBULATORY_CARE_PROVIDER_SITE_OTHER): Payer: Medicare Other | Admitting: Podiatry

## 2015-04-18 ENCOUNTER — Ambulatory Visit (INDEPENDENT_AMBULATORY_CARE_PROVIDER_SITE_OTHER): Payer: Medicare Other

## 2015-04-18 ENCOUNTER — Encounter: Payer: Self-pay | Admitting: Podiatry

## 2015-04-18 VITALS — BP 123/80 | HR 82 | Resp 12

## 2015-04-18 DIAGNOSIS — M7662 Achilles tendinitis, left leg: Secondary | ICD-10-CM | POA: Diagnosis not present

## 2015-04-18 DIAGNOSIS — R52 Pain, unspecified: Secondary | ICD-10-CM | POA: Diagnosis not present

## 2015-04-18 MED ORDER — PREDNISONE 10 MG (21) PO TBPK: ORAL_TABLET | ORAL | Status: DC

## 2015-04-18 NOTE — Patient Instructions (Signed)
Today your exam revealed a heel spur with an inflamed heel cord/Achilles tendinitis Discontinue Aleve when taking 6 days of 10 mg prednisone Wear shoe with a heel Okay to do his many of these exercises shoes repeat 1-3 times a day 30-60 seconds The main exercises the stretch with your knee straight with tissue on to stretch your gastroc muscle  Achilles Tendinitis With Rehab Achilles tendinitis is a disorder of the Achilles tendon. The Achilles tendon connects the large calf muscles (Gastrocnemius and Soleus) to the heel bone (calcaneus). This tendon is sometimes called the heel cord. It is important for pushing-off and standing on your toes and is important for walking, running, or jumping. Tendinitis is often caused by overuse and repetitive microtrauma. SYMPTOMS  Pain, tenderness, swelling, warmth, and redness may occur over the Achilles tendon even at rest.  Pain with pushing off, or flexing or extending the ankle.  Pain that is worsened after or during activity. CAUSES   Overuse sometimes seen with rapid increase in exercise programs or in sports requiring running and jumping.  Poor physical conditioning (strength and flexibility or endurance).  Running sports, especially training running down hills.  Inadequate warm-up before practice or play or failure to stretch before participation.  Injury to the tendon. PREVENTION   Warm up and stretch before practice or competition.  Allow time for adequate rest and recovery between practices and competition.  Keep up conditioning.  Keep up ankle and leg flexibility.  Improve or keep muscle strength and endurance.  Improve cardiovascular fitness.  Use proper technique.  Use proper equipment (shoes, skates).  To help prevent recurrence, taping, protective strapping, or an adhesive bandage may be recommended for several weeks after healing is complete. PROGNOSIS   Recovery may take weeks to several months to heal.  Longer  recovery is expected if symptoms have been prolonged.  Recovery is usually quicker if the inflammation is due to a direct blow as compared with overuse or sudden strain. RELATED COMPLICATIONS   Healing time will be prolonged if the condition is not correctly treated. The injury must be given plenty of time to heal.  Symptoms can reoccur if activity is resumed too soon.  Untreated, tendinitis may increase the risk of tendon rupture requiring additional time for recovery and possibly surgery. TREATMENT   The first treatment consists of rest anti-inflammatory medication, and ice to relieve the pain.  Stretching and strengthening exercises after resolution of pain will likely help reduce the risk of recurrence. Referral to a physical therapist or athletic trainer for further evaluation and treatment may be helpful.  A walking boot or cast may be recommended to rest the Achilles tendon. This can help break the cycle of inflammation and microtrauma.  Arch supports (orthotics) may be prescribed or recommended by your caregiver as an adjunct to therapy and rest.  Surgery to remove the inflamed tendon lining or degenerated tendon tissue is rarely necessary and has shown less than predictable results. MEDICATION   Nonsteroidal anti-inflammatory medications, such as aspirin and ibuprofen, may be used for pain and inflammation relief. Do not take within 7 days before surgery. Take these as directed by your caregiver. Contact your caregiver immediately if any bleeding, stomach upset, or signs of allergic reaction occur. Other minor pain relievers, such as acetaminophen, may also be used.  Pain relievers may be prescribed as necessary by your caregiver. Do not take prescription pain medication for longer than 4 to 7 days. Use only as directed and only as much  as you need.  Cortisone injections are rarely indicated. Cortisone injections may weaken tendons and predispose to rupture. It is better to give  the condition more time to heal than to use them. HEAT AND COLD  Cold is used to relieve pain and reduce inflammation for acute and chronic Achilles tendinitis. Cold should be applied for 10 to 15 minutes every 2 to 3 hours for inflammation and pain and immediately after any activity that aggravates your symptoms. Use ice packs or an ice massage.  Heat may be used before performing stretching and strengthening activities prescribed by your caregiver. Use a heat pack or a warm soak. SEEK MEDICAL CARE IF:  Symptoms get worse or do not improve in 2 weeks despite treatment.  New, unexplained symptoms develop. Drugs used in treatment may produce side effects. EXERCISES RANGE OF MOTION (ROM) AND STRETCHING EXERCISES - Achilles Tendinitis  These exercises may help you when beginning to rehabilitate your injury. Your symptoms may resolve with or without further involvement from your physician, physical therapist or athletic trainer. While completing these exercises, remember:   Restoring tissue flexibility helps normal motion to return to the joints. This allows healthier, less painful movement and activity.  An effective stretch should be held for at least 30 seconds.  A stretch should never be painful. You should only feel a gentle lengthening or release in the stretched tissue. STRETCH - Gastroc, Standing   Place hands on wall.  Extend right / left leg, keeping the front knee somewhat bent.  Slightly point your toes inward on your back foot.  Keeping your right / left heel on the floor and your knee straight, shift your weight toward the wall, not allowing your back to arch.  You should feel a gentle stretch in the right / left calf. Hold this position for __________ seconds. Repeat ______ 3 times____ times. Complete this stretch __________ times per day. STRETCH - Soleus, Standing   Place hands on wall.  Extend right / left leg, keeping the other knee somewhat bent.  Slightly point  your toes inward on your back foot.  Keep your right / left heel on the floor, bend your back knee, and slightly shift your weight over the back leg so that you feel a gentle stretch deep in your back calf.  Hold this position for __________ seconds. Repeat __________ times. Complete this stretch __________ times per day. STRETCH - Gastrocsoleus, Standing  Note: This exercise can place a lot of stress on your foot and ankle. Please complete this exercise only if specifically instructed by your caregiver.   Place the ball of your right / left foot on a step, keeping your other foot firmly on the same step.  Hold on to the wall or a rail for balance.  Slowly lift your other foot, allowing your body weight to press your heel down over the edge of the step.  You should feel a stretch in your right / left calf.  Hold this position for __________ seconds.  Repeat this exercise with a slight bend in your knee. Repeat __________ times. Complete this stretch __________ times per day.  STRENGTHENING EXERCISES - Achilles Tendinitis These exercises may help you when beginning to rehabilitate your injury. They may resolve your symptoms with or without further involvement from your physician, physical therapist or athletic trainer. While completing these exercises, remember:   Muscles can gain both the endurance and the strength needed for everyday activities through controlled exercises.  Complete these exercises as  instructed by your physician, physical therapist or athletic trainer. Progress the resistance and repetitions only as guided.  You may experience muscle soreness or fatigue, but the pain or discomfort you are trying to eliminate should never worsen during these exercises. If this pain does worsen, stop and make certain you are following the directions exactly. If the pain is still present after adjustments, discontinue the exercise until you can discuss the trouble with your  clinician. STRENGTH - Plantar-flexors   Sit with your right / left leg extended. Holding onto both ends of a rubber exercise band/tubing, loop it around the ball of your foot. Keep a slight tension in the band.  Slowly push your toes away from you, pointing them downward.  Hold this position for __________ seconds. Return slowly, controlling the tension in the band/tubing. Repeat __________ times. Complete this exercise __________ times per day.  STRENGTH - Plantar-flexors   Stand with your feet shoulder width apart. Steady yourself with a wall or table using as little support as needed.  Keeping your weight evenly spread over the width of your feet, rise up on your toes.*  Hold this position for __________ seconds. Repeat __________ times. Complete this exercise __________ times per day.  *If this is too easy, shift your weight toward your right / left leg until you feel challenged. Ultimately, you may be asked to do this exercise with your right / left foot only. STRENGTH - Plantar-flexors, Eccentric  Note: This exercise can place a lot of stress on your foot and ankle. Please complete this exercise only if specifically instructed by your caregiver.   Place the balls of your feet on a step. With your hands, use only enough support from a wall or rail to keep your balance.  Keep your knees straight and rise up on your toes.  Slowly shift your weight entirely to your right / left toes and pick up your opposite foot. Gently and with controlled movement, lower your weight through your right / left foot so that your heel drops below the level of the step. You will feel a slight stretch in the back of your calf at the end position.  Use the healthy leg to help rise up onto the balls of both feet, then lower weight only on the right / left leg again. Build up to 15 repetitions. Then progress to 3 consecutive sets of 15 repetitions.*  After completing the above exercise, complete the same  exercise with a slight knee bend (about 30 degrees). Again, build up to 15 repetitions. Then progress to 3 consecutive sets of 15 repetitions.* Perform this exercise __________ times per day.  *When you easily complete 3 sets of 15, your physician, physical therapist or athletic trainer may advise you to add resistance by wearing a backpack filled with additional weight. STRENGTH - Plantar Flexors, Seated   Sit on a chair that allows your feet to rest flat on the ground. If necessary, sit at the edge of the chair.  Keeping your toes firmly on the ground, lift your right / left heel as far as you can without increasing any discomfort in your ankle. Repeat __________ times. Complete this exercise __________ times a day. *If instructed by your physician, physical therapist or athletic trainer, you may add ____________________ of resistance by placing a weighted object on your right / left knee.   This information is not intended to replace advice given to you by your health care provider. Make sure you discuss any questions  you have with your health care provider.   Document Released: 10/10/2004 Document Revised: 04/01/2014 Document Reviewed: 06/23/2008 Elsevier Interactive Patient Education [employer].

## 2015-04-18 NOTE — Progress Notes (Signed)
   Subjective:    Patient ID: Elizabeth Barron, female    DOB: October 28, 1945, 70 y.o.   MRN: [id number]  HPI    Day this patient presents today complaining of 6 weeks history of posterior left heel pain aggravated with standing walking and relieved with rest. Patient denies any direct injury or change of activity. Patient has been taking over-the-counter Aleve twice a day plus Tylenol for ongoing back pain without any improvement of the posterior left heel pain.  Review of Systems  Cardiovascular: Positive for leg swelling.  Musculoskeletal: Positive for joint swelling.  All other systems reviewed and are negative.      Objective:   Physical Exam Orientated 3  Vascular: DP and PT pulses 2/4 bilaterally Capillary reflex immediate bilaterally  Neurological: Sensation to 10 g monofilament wire intact 5/5 bilaterally Vibratory sensation reactive bilaterally Ankle reflex equal and reactive bilaterally  Dermatological: Well-healed surgical scar medial left first MPJ Small fascicular eruption medial right arch  Musculoskeletal: HAV deformity right Palpable tenderness insertional area posterior calcaneus with mild palpable bony prominence. There are no palpable lesions proximal to the insertional area of the tendo Achilles and plantar flexion of the left tendo Achilles is 5/5  X-ray Examination weightbearing left foot dated 04/18/2015  Intact bony structure without a fracture or dislocation Retained internal K wire fixation head of first metatarsal Well-organized posterior calcaneal spur with calcification in around the insertional area Small inferior calcaneal spur  Radiographic impression: No acute bony abnormality noted left foot     Assessment & Plan:   Assessment: Satisfactory neurovascular status Posterior calcaneal spur left Achilles tendinitis left  Plan: Today review the results x-ray examination patient today Begin home physical therapy primarily stretching  gastroc Rx 6 day 10 mg prednisone tapering Dosepak 1  Reappoint at patient's request

## 2015-06-27 ENCOUNTER — Other Ambulatory Visit: Payer: Self-pay | Admitting: Anesthesiology

## 2015-06-27 DIAGNOSIS — M5417 Radiculopathy, lumbosacral region: Secondary | ICD-10-CM

## 2015-07-04 ENCOUNTER — Ambulatory Visit
Admission: RE | Admit: 2015-07-04 | Discharge: 2015-07-04 | Disposition: A | Discharge status: Home / Self Care | Payer: Medicare Other | Source: Ambulatory Visit | Attending: Anesthesiology | Admitting: Anesthesiology

## 2015-07-04 DIAGNOSIS — M5417 Radiculopathy, lumbosacral region: Secondary | ICD-10-CM

## 2015-10-10 ENCOUNTER — Other Ambulatory Visit: Payer: Self-pay | Admitting: Anesthesiology

## 2015-10-10 DIAGNOSIS — M5417 Radiculopathy, lumbosacral region: Secondary | ICD-10-CM

## 2015-10-13 ENCOUNTER — Ambulatory Visit
Admission: RE | Admit: 2015-10-13 | Discharge: 2015-10-13 | Disposition: A | Discharge status: Home / Self Care | Payer: Medicare Other | Source: Ambulatory Visit | Attending: Anesthesiology | Admitting: Anesthesiology

## 2015-10-13 DIAGNOSIS — M5417 Radiculopathy, lumbosacral region: Secondary | ICD-10-CM

## 2015-10-13 MED ORDER — DIAZEPAM 5 MG PO TABS
5.0000 mg | ORAL_TABLET | Freq: Once | ORAL | Status: AC
  Administered 2015-10-13: 5 mg via ORAL

## 2015-10-13 MED ORDER — IOPAMIDOL (ISOVUE-M 200) INJECTION 41%
15.0000 mL | Freq: Once | INTRAMUSCULAR | Status: AC
  Administered 2015-10-13: 15 mL via INTRATHECAL

## 2015-10-13 NOTE — Discharge Instructions (Signed)

## 2015-10-16 ENCOUNTER — Other Ambulatory Visit: Payer: Medicare Other

## 2015-10-30 ENCOUNTER — Other Ambulatory Visit: Payer: Self-pay | Admitting: Neurological Surgery

## 2015-11-29 ENCOUNTER — Encounter (HOSPITAL_COMMUNITY)
Admission: RE | Admit: 2015-11-29 | Discharge: 2015-11-29 | Disposition: A | Discharge status: Home / Self Care | Payer: Medicare Other | Source: Ambulatory Visit | Attending: Neurological Surgery | Admitting: Neurological Surgery

## 2015-11-29 ENCOUNTER — Encounter (HOSPITAL_COMMUNITY): Payer: Self-pay | Admitting: *Deleted

## 2015-11-29 ENCOUNTER — Ambulatory Visit (HOSPITAL_COMMUNITY)
Admission: RE | Admit: 2015-11-29 | Discharge: 2015-11-29 | Disposition: A | Discharge status: Home / Self Care | Payer: Medicare Other | Source: Ambulatory Visit | Attending: Neurological Surgery | Admitting: Neurological Surgery

## 2015-11-29 DIAGNOSIS — M4310 Spondylolisthesis, site unspecified: Secondary | ICD-10-CM | POA: Insufficient documentation

## 2015-11-29 LAB — PROTIME-INR
INR: 0.97
Prothrombin Time: 12.9 seconds (ref 11.4–15.2)

## 2015-11-29 LAB — CBC WITH DIFFERENTIAL/PLATELET
Basophils Absolute: 0 10*3/uL (ref 0.0–0.1)
Basophils Relative: 0 %
Eosinophils Absolute: 0.2 10*3/uL (ref 0.0–0.7)
Eosinophils Relative: 2 %
HCT: 47.1 % — ABNORMAL HIGH (ref 36.0–46.0)
Hemoglobin: 14.9 g/dL (ref 12.0–15.0)
Lymphocytes Relative: 25 %
Lymphs Abs: 1.9 10*3/uL (ref 0.7–4.0)
MCH: 27.8 pg (ref 26.0–34.0)
MCHC: 31.6 g/dL (ref 30.0–36.0)
MCV: 87.9 fL (ref 78.0–100.0)
Monocytes Absolute: 0.5 10*3/uL (ref 0.1–1.0)
Monocytes Relative: 6 %
Neutro Abs: 4.9 10*3/uL (ref 1.7–7.7)
Neutrophils Relative %: 67 %
Platelets: 253 10*3/uL (ref 150–400)
RBC: 5.36 MIL/uL — ABNORMAL HIGH (ref 3.87–5.11)
RDW: 14.5 % (ref 11.5–15.5)
WBC: 7.4 10*3/uL (ref 4.0–10.5)

## 2015-11-29 LAB — BASIC METABOLIC PANEL
Anion gap: 7 (ref 5–15)
BUN: 14 mg/dL (ref 6–20)
CO2: 27 mmol/L (ref 22–32)
Calcium: 9.8 mg/dL (ref 8.9–10.3)
Chloride: 106 mmol/L (ref 101–111)
Creatinine, Ser: 0.75 mg/dL (ref 0.44–1.00)
GFR calc Af Amer: >60 mL/min (ref >60)
GFR calc non Af Amer: >60 mL/min (ref >60)
Glucose, Bld: 99 mg/dL (ref 65–99)
Potassium: 3.9 mmol/L (ref 3.5–5.1)
Sodium: 140 mmol/L (ref 135–145)

## 2015-11-29 LAB — TYPE AND SCREEN
ABO/RH(D): O POS
Antibody Screen: NEGATIVE

## 2015-11-29 LAB — ABO/RH: ABO/RH(D): O POS

## 2015-11-29 LAB — SURGICAL PCR SCREEN
MRSA, PCR: NEGATIVE
Staphylococcus aureus: NEGATIVE

## 2015-11-29 NOTE — Pre-Procedure Instructions (Signed)
Lexianna Breitzman Schaus  11/29/2015      [facility] SN:[id number] - [address], [address] [place] [place] [address] Phone: [phone] Fax: [phone]  [facility] [place], [address] AT [place] [address] Phone: [phone] Fax: [phone]    Your procedure is scheduled on  Wednesday  12/06/15  Report to [facility] Admitting at 730 A.M.  Call this number if you have problems the morning of surgery:  [phone]   Remember:  Do not eat food or drink liquids after midnight.  Take these medicines the morning of surgery with A SIP OF WATER   EYE DROPS, HYDROCODONE, LEVOTHYROXINE (STOP 7 DAYS PRIOR TO SURGERY ASPIRIN ,OR ASPIRIN PRODUCTS, NAPROXEN, IBUPROFEF/ ADVIL/ MOTRIN, GOODY POWDERS/ BC'S, HERBAL MEDICINES )   Do not wear jewelry, make-up or nail polish.  Do not wear lotions, powders, or perfumes, or deoderant.  Do not shave 48 hours prior to surgery.  Men may shave face and neck.  Do not bring valuables to the hospital.  [facility] is not responsible for any belongings or valuables.  Contacts, dentures or bridgework may not be worn into surgery.  Leave your suitcase in the car.  After surgery it may be brought to your room.  For patients admitted to the hospital, discharge time will be determined by your treatment team.  Patients discharged the day of surgery will not be allowed to drive home.   Name and phone number of your driver:   Special instructions:  [facility] - Preparing for Surgery  Before surgery, you can play an important role.  Because skin is not sterile, your skin needs to be as free of germs as possible.  You can reduce the number of germs on you skin by washing with CHG (chlorahexidine gluconate) soap before surgery.  CHG is an antiseptic cleaner which kills germs and bonds with  the skin to continue killing germs even after washing.  Please DO NOT use if you have an allergy to CHG or antibacterial soaps.  If your skin becomes reddened/irritated stop using the CHG and inform your nurse when you arrive at Short Stay.  Do not shave (including legs and underarms) for at least 48 hours prior to the first CHG shower.  You may shave your face.  Please follow these instructions carefully:   1.  Shower with CHG Soap the night before surgery and the                                morning of Surgery.  2.  If you choose to wash your hair, wash your hair first as usual with your       normal shampoo.  3.  After you shampoo, rinse your hair and body thoroughly to remove the                      Shampoo.  4.  Use CHG as you would any other liquid soap.  You can apply chg directly       to the skin and wash gently with scrungie or a clean washcloth.  5.  Apply the CHG Soap to your body ONLY FROM THE NECK DOWN.  Do not use on open wounds or open sores.  Avoid contact with your eyes,       ears, mouth and genitals (private parts).  Wash genitals (private parts)       with your normal soap.  6.  Wash thoroughly, paying special attention to the area where your surgery        will be performed.  7.  Thoroughly rinse your body with warm water from the neck down.  8.  DO NOT shower/wash with your normal soap after using and rinsing off       the CHG Soap.  9.  Pat yourself dry with a clean towel.            10.  Wear clean pajamas.            11.  Place clean sheets on your bed the night of your first shower and do not        sleep with pets.  Day of Surgery  Do not apply any lotions/deoderants the morning of surgery.  Please wear clean clothes to the hospital/surgery center.    Please read over the following fact sheets that you were given. Pain Booklet, Blood Transfusion Information, MRSA Information and Surgical Site Infection Prevention

## 2015-11-30 NOTE — Progress Notes (Signed)
Anesthesia Chart Review:  Pt is a 70 year old female scheduled for L4-5, L5-S1 maximum access PLIF on 12/06/2015 with Sherley Bounds.   PCP is Seward Carol, MD  PMH includes:  Anemia, hyperlipidemia, hypothyroidism, GERD. Never smoker. BMI 44  Medications include: nexium, levothyroxine  Preoperative labs reviewed.    CXR 11/29/15: No edema or consolidation.  EKG 11/29/15: NSR. RBBB. LAFB. Minimal voltage criteria for LVH, may be normal variant - I have routed EKG to Dr. Delfina Redwood for f/u purposes.   If no changes, I anticipate pt can proceed with surgery as scheduled.   Willeen Cass, FNP-BC [facility] Short Stay Surgical Center/Anesthesiology Phone: [phone] 11/30/2015 3:41 PM

## 2015-12-05 ENCOUNTER — Encounter (HOSPITAL_COMMUNITY): Payer: Self-pay | Admitting: Anesthesiology

## 2015-12-05 NOTE — Anesthesia Preprocedure Evaluation (Addendum)
Anesthesia Evaluation  Patient identified by MRN, date of birth, ID band Patient awake    Reviewed: Allergy & Precautions, NPO status , Patient's Chart, lab work & pertinent test results  Airway Mallampati: I       Dental no notable dental hx. (+) Teeth Intact, Caps   Pulmonary neg pulmonary ROS,    Pulmonary exam normal breath sounds clear to auscultation       Cardiovascular negative cardio ROS Normal cardiovascular exam Rhythm:Regular Rate:Normal     Neuro/Psych  Headaches, Restless legs syndrome negative psych ROS   GI/Hepatic Neg liver ROS, GERD  Medicated and Controlled,  Endo/Other  Hypothyroidism Morbid obesityHypercholesterolemia  Renal/GU negative Renal ROS  negative genitourinary   Musculoskeletal  (+) Arthritis , Spondylolisthesis Lumbar   Abdominal (+) + obese,   Peds  Hematology  (+) anemia ,   Anesthesia Other Findings   Reproductive/Obstetrics negative OB ROS                           Lab Results  Component Value Date   WBC 7.4 11/29/2015   HGB 14.9 11/29/2015   HCT 47.1 (H) 11/29/2015   MCV 87.9 11/29/2015   PLT 253 11/29/2015     Chemistry      Component Value Date/Time   NA 140 11/29/2015 1636   K 3.9 11/29/2015 1636   CL 106 11/29/2015 1636   CO2 27 11/29/2015 1636   BUN 14 11/29/2015 1636   CREATININE 0.75 11/29/2015 1636      Component Value Date/Time   CALCIUM 9.8 11/29/2015 1636     EKG: normal sinus rhythm, RBBB+LAFB, 1st degree AV block, minimal voltage criteria for LVH.  Anesthesia Physical Anesthesia Plan  ASA: II  Anesthesia Plan: General   Post-op Pain Management:    Induction: Intravenous  Airway Management Planned: Oral ETT  Additional Equipment:   Intra-op Plan:   Post-operative Plan: Extubation in OR  Informed Consent: I have reviewed the patients History and Physical, chart, labs and discussed the procedure including the  risks, benefits and alternatives for the proposed anesthesia with the patient or authorized representative who has indicated his/her understanding and acceptance.   Dental advisory given  Plan Discussed with: CRNA, Anesthesiologist and Surgeon  Anesthesia Plan Comments:         Anesthesia Quick Evaluation

## 2015-12-06 ENCOUNTER — Inpatient Hospital Stay (HOSPITAL_COMMUNITY): Payer: Medicare Other | Admitting: Anesthesiology

## 2015-12-06 ENCOUNTER — Inpatient Hospital Stay (HOSPITAL_COMMUNITY): Payer: Medicare Other

## 2015-12-06 ENCOUNTER — Encounter (HOSPITAL_COMMUNITY)
Admission: RE | Disposition: A | Discharge status: Home / Self Care | Payer: Self-pay | Source: Ambulatory Visit | Attending: Neurological Surgery

## 2015-12-06 ENCOUNTER — Encounter (HOSPITAL_COMMUNITY): Payer: Self-pay | Admitting: *Deleted

## 2015-12-06 ENCOUNTER — Inpatient Hospital Stay (HOSPITAL_COMMUNITY): Payer: Medicare Other | Admitting: Emergency Medicine

## 2015-12-06 ENCOUNTER — Inpatient Hospital Stay (HOSPITAL_COMMUNITY)
Admission: RE | Admit: 2015-12-06 | Discharge: 2015-12-07 | DRG: 460 | Disposition: A | Discharge status: Home / Self Care | Payer: Medicare Other | Source: Ambulatory Visit | Attending: Neurological Surgery | Admitting: Neurological Surgery

## 2015-12-06 DIAGNOSIS — Z808 Family history of malignant neoplasm of other organs or systems: Secondary | ICD-10-CM | POA: Diagnosis not present

## 2015-12-06 DIAGNOSIS — G2581 Restless legs syndrome: Secondary | ICD-10-CM | POA: Diagnosis present

## 2015-12-06 DIAGNOSIS — M4806 Spinal stenosis, lumbar region: Secondary | ICD-10-CM | POA: Diagnosis present

## 2015-12-06 DIAGNOSIS — E7800 Pure hypercholesterolemia, unspecified: Secondary | ICD-10-CM | POA: Diagnosis present

## 2015-12-06 DIAGNOSIS — M4807 Spinal stenosis, lumbosacral region: Secondary | ICD-10-CM | POA: Diagnosis present

## 2015-12-06 DIAGNOSIS — M4316 Spondylolisthesis, lumbar region: Secondary | ICD-10-CM | POA: Diagnosis present

## 2015-12-06 DIAGNOSIS — K219 Gastro-esophageal reflux disease without esophagitis: Secondary | ICD-10-CM | POA: Diagnosis present

## 2015-12-06 DIAGNOSIS — E039 Hypothyroidism, unspecified: Secondary | ICD-10-CM | POA: Diagnosis present

## 2015-12-06 DIAGNOSIS — H409 Unspecified glaucoma: Secondary | ICD-10-CM | POA: Diagnosis present

## 2015-12-06 DIAGNOSIS — Z6841 Body Mass Index (BMI) 40.0 and over, adult: Secondary | ICD-10-CM | POA: Diagnosis not present

## 2015-12-06 DIAGNOSIS — M4317 Spondylolisthesis, lumbosacral region: Secondary | ICD-10-CM | POA: Diagnosis present

## 2015-12-06 DIAGNOSIS — M1288 Other specific arthropathies, not elsewhere classified, other specified site: Secondary | ICD-10-CM | POA: Diagnosis present

## 2015-12-06 DIAGNOSIS — Z419 Encounter for procedure for purposes other than remedying health state, unspecified: Secondary | ICD-10-CM

## 2015-12-06 DIAGNOSIS — Z981 Arthrodesis status: Secondary | ICD-10-CM

## 2015-12-06 DIAGNOSIS — Z800 Family history of malignant neoplasm of digestive organs: Secondary | ICD-10-CM | POA: Diagnosis not present

## 2015-12-06 DIAGNOSIS — M4310 Spondylolisthesis, site unspecified: Secondary | ICD-10-CM

## 2015-12-06 HISTORY — DX: Restless legs syndrome: G25.81

## 2015-12-06 HISTORY — PX: MAXIMUM ACCESS (MAS)POSTERIOR LUMBAR INTERBODY FUSION (PLIF) 2 LEVEL: SHX6369

## 2015-12-06 HISTORY — DX: Headache: R51

## 2015-12-06 HISTORY — DX: Headache, unspecified: R51.9

## 2015-12-06 HISTORY — DX: Gastro-esophageal reflux disease without esophagitis: K21.9

## 2015-12-06 SURGERY — FOR MAXIMUM ACCESS (MAS) POSTERIOR LUMBAR INTERBODY FUSION (PLIF) 2 LEVEL
Anesthesia: General | Site: Back

## 2015-12-06 MED ORDER — BRINZOLAMIDE 1 % OP SUSP
1.0000 [drp] | Freq: Two times a day (BID) | OPHTHALMIC | Status: DC
  Administered 2015-12-06: 1 [drp] via OPHTHALMIC
  Filled 2015-12-06: qty 10

## 2015-12-06 MED ORDER — ONDANSETRON HCL 4 MG/2ML IJ SOLN: 4.0000 mg | INTRAMUSCULAR | Status: DC | PRN

## 2015-12-06 MED ORDER — PROMETHAZINE HCL 25 MG/ML IJ SOLN
6.2500 mg | INTRAMUSCULAR | Status: DC | PRN
  Administered 2015-12-06: 12.5 mg via INTRAVENOUS

## 2015-12-06 MED ORDER — MENTHOL 3 MG MT LOZG: 1.0000 | LOZENGE | OROMUCOSAL | Status: DC | PRN

## 2015-12-06 MED ORDER — SODIUM CHLORIDE 0.9 % IV SOLN: 250.0000 mL | INTRAVENOUS | Status: DC

## 2015-12-06 MED ORDER — ONDANSETRON HCL 4 MG/2ML IJ SOLN
INTRAMUSCULAR | Status: DC | PRN
  Administered 2015-12-06: 4 mg via INTRAVENOUS

## 2015-12-06 MED ORDER — CEFAZOLIN IN D5W 1 GM/50ML IV SOLN
1.0000 g | Freq: Three times a day (TID) | INTRAVENOUS | Status: AC
  Administered 2015-12-06 (×2): 1 g via INTRAVENOUS
  Filled 2015-12-06 (×2): qty 50

## 2015-12-06 MED ORDER — ONDANSETRON HCL 4 MG/2ML IJ SOLN
INTRAMUSCULAR | Status: AC
  Filled 2015-12-06: qty 2

## 2015-12-06 MED ORDER — PHENYLEPHRINE 40 MCG/ML (10ML) SYRINGE FOR IV PUSH (FOR BLOOD PRESSURE SUPPORT)
PREFILLED_SYRINGE | INTRAVENOUS | Status: AC
  Filled 2015-12-06: qty 10

## 2015-12-06 MED ORDER — BUPIVACAINE HCL (PF) 0.25 % IJ SOLN
INTRAMUSCULAR | Status: DC | PRN
  Administered 2015-12-06: 5 mL

## 2015-12-06 MED ORDER — MEPERIDINE HCL 25 MG/ML IJ SOLN: 6.2500 mg | INTRAMUSCULAR | Status: DC | PRN

## 2015-12-06 MED ORDER — PROPOFOL 10 MG/ML IV BOLUS
INTRAVENOUS | Status: DC | PRN
  Administered 2015-12-06: 160 mg via INTRAVENOUS

## 2015-12-06 MED ORDER — FENTANYL CITRATE (PF) 100 MCG/2ML IJ SOLN
INTRAMUSCULAR | Status: AC
  Filled 2015-12-06: qty 2

## 2015-12-06 MED ORDER — SODIUM CHLORIDE 0.9% FLUSH: 3.0000 mL | INTRAVENOUS | Status: DC | PRN

## 2015-12-06 MED ORDER — DEXAMETHASONE SODIUM PHOSPHATE 10 MG/ML IJ SOLN
10.0000 mg | INTRAMUSCULAR | Status: AC
  Administered 2015-12-06: 10 mg via INTRAVENOUS
  Filled 2015-12-06: qty 1

## 2015-12-06 MED ORDER — OXYCODONE-ACETAMINOPHEN 5-325 MG PO TABS
1.0000 | ORAL_TABLET | ORAL | Status: DC | PRN
  Administered 2015-12-06 – 2015-12-07 (×4): 2 via ORAL
  Filled 2015-12-06 (×3): qty 2

## 2015-12-06 MED ORDER — SUGAMMADEX SODIUM 200 MG/2ML IV SOLN
INTRAVENOUS | Status: AC
  Filled 2015-12-06: qty 2

## 2015-12-06 MED ORDER — ROCURONIUM BROMIDE 10 MG/ML (PF) SYRINGE
PREFILLED_SYRINGE | INTRAVENOUS | Status: DC | PRN
  Administered 2015-12-06: 30 mg via INTRAVENOUS
  Administered 2015-12-06: 20 mg via INTRAVENOUS
  Administered 2015-12-06: 50 mg via INTRAVENOUS

## 2015-12-06 MED ORDER — METHOCARBAMOL 1000 MG/10ML IJ SOLN
500.0000 mg | Freq: Four times a day (QID) | INTRAVENOUS | Status: DC | PRN
  Filled 2015-12-06: qty 5

## 2015-12-06 MED ORDER — LACTATED RINGERS IV SOLN
INTRAVENOUS | Status: DC
  Administered 2015-12-06 (×2): via INTRAVENOUS

## 2015-12-06 MED ORDER — ACETAMINOPHEN 650 MG RE SUPP: 650.0000 mg | RECTAL | Status: DC | PRN

## 2015-12-06 MED ORDER — PROMETHAZINE HCL 25 MG/ML IJ SOLN
INTRAMUSCULAR | Status: AC
  Filled 2015-12-06: qty 1

## 2015-12-06 MED ORDER — DIPHENHYDRAMINE HCL 25 MG PO CAPS
25.0000 mg | ORAL_CAPSULE | Freq: Four times a day (QID) | ORAL | Status: DC | PRN
Start: 2015-12-06 — End: 2015-12-06
  Administered 2015-12-06: 25 mg via ORAL
  Filled 2015-12-06: qty 1

## 2015-12-06 MED ORDER — OXYCODONE-ACETAMINOPHEN 5-325 MG PO TABS
ORAL_TABLET | ORAL | Status: AC
  Filled 2015-12-06: qty 2

## 2015-12-06 MED ORDER — CHLORHEXIDINE GLUCONATE CLOTH 2 % EX PADS: 6.0000 | MEDICATED_PAD | Freq: Once | CUTANEOUS | Status: DC

## 2015-12-06 MED ORDER — PHENYLEPHRINE HCL 10 MG/ML IJ SOLN
INTRAVENOUS | Status: DC | PRN
  Administered 2015-12-06: 25 ug/min via INTRAVENOUS

## 2015-12-06 MED ORDER — EPHEDRINE SULFATE-NACL 50-0.9 MG/10ML-% IV SOSY
PREFILLED_SYRINGE | INTRAVENOUS | Status: DC | PRN
  Administered 2015-12-06: 7.5 mg via INTRAVENOUS

## 2015-12-06 MED ORDER — HYDROMORPHONE HCL 1 MG/ML IJ SOLN
INTRAMUSCULAR | Status: AC
  Filled 2015-12-06: qty 1

## 2015-12-06 MED ORDER — MORPHINE SULFATE (PF) 2 MG/ML IV SOLN
1.0000 mg | INTRAVENOUS | Status: DC | PRN
Start: 2015-12-06 — End: 2015-12-07
  Administered 2015-12-06: 2 mg via INTRAVENOUS
  Filled 2015-12-06: qty 1

## 2015-12-06 MED ORDER — CELECOXIB 200 MG PO CAPS
200.0000 mg | ORAL_CAPSULE | Freq: Two times a day (BID) | ORAL | Status: DC
  Administered 2015-12-06 – 2015-12-07 (×3): 200 mg via ORAL
  Filled 2015-12-06 (×3): qty 1

## 2015-12-06 MED ORDER — POTASSIUM CHLORIDE IN NACL 20-0.9 MEQ/L-% IV SOLN
INTRAVENOUS | Status: DC
  Filled 2015-12-06: qty 1000

## 2015-12-06 MED ORDER — LEVOTHYROXINE SODIUM 175 MCG PO TABS
175.0000 ug | ORAL_TABLET | Freq: Every day | ORAL | Status: DC
  Administered 2015-12-07: 175 ug via ORAL
  Filled 2015-12-06: qty 1

## 2015-12-06 MED ORDER — SODIUM CHLORIDE 0.9% FLUSH: 3.0000 mL | Freq: Two times a day (BID) | INTRAVENOUS | Status: DC

## 2015-12-06 MED ORDER — METHOCARBAMOL 500 MG PO TABS
ORAL_TABLET | ORAL | Status: AC
  Filled 2015-12-06: qty 1

## 2015-12-06 MED ORDER — PHENYLEPHRINE HCL 10 MG/ML IJ SOLN
INTRAMUSCULAR | Status: DC | PRN
  Administered 2015-12-06: 80 ug via INTRAVENOUS

## 2015-12-06 MED ORDER — SODIUM CHLORIDE 0.9 % IR SOLN
Status: DC | PRN
  Administered 2015-12-06: 10:00:00

## 2015-12-06 MED ORDER — THROMBIN 20000 UNITS EX SOLR
CUTANEOUS | Status: DC | PRN
  Administered 2015-12-06: 10:00:00 via TOPICAL

## 2015-12-06 MED ORDER — LIDOCAINE 2% (20 MG/ML) 5 ML SYRINGE
INTRAMUSCULAR | Status: AC
  Filled 2015-12-06: qty 5

## 2015-12-06 MED ORDER — SUGAMMADEX SODIUM 200 MG/2ML IV SOLN
INTRAVENOUS | Status: DC | PRN
  Administered 2015-12-06: 200 mg via INTRAVENOUS

## 2015-12-06 MED ORDER — VANCOMYCIN HCL 1000 MG IV SOLR
INTRAVENOUS | Status: DC | PRN
  Administered 2015-12-06: 1000 mg via TOPICAL

## 2015-12-06 MED ORDER — DOCUSATE SODIUM 100 MG PO CAPS
100.0000 mg | ORAL_CAPSULE | Freq: Two times a day (BID) | ORAL | Status: DC
  Administered 2015-12-06 – 2015-12-07 (×3): 100 mg via ORAL
  Filled 2015-12-06 (×3): qty 1

## 2015-12-06 MED ORDER — ROCURONIUM BROMIDE 100 MG/10ML IV SOLN: INTRAVENOUS | Status: DC | PRN

## 2015-12-06 MED ORDER — DIPHENHYDRAMINE HCL 25 MG PO CAPS
25.0000 mg | ORAL_CAPSULE | Freq: Four times a day (QID) | ORAL | Status: DC | PRN
  Administered 2015-12-06: 25 mg via ORAL
  Filled 2015-12-06: qty 1

## 2015-12-06 MED ORDER — HYDROMORPHONE HCL 1 MG/ML IJ SOLN
INTRAMUSCULAR | Status: AC
  Administered 2015-12-06: 1 mg
  Filled 2015-12-06: qty 1

## 2015-12-06 MED ORDER — METHOCARBAMOL 500 MG PO TABS
500.0000 mg | ORAL_TABLET | Freq: Four times a day (QID) | ORAL | Status: DC | PRN
  Administered 2015-12-06 – 2015-12-07 (×3): 500 mg via ORAL
  Filled 2015-12-06 (×3): qty 1

## 2015-12-06 MED ORDER — ACETAMINOPHEN 325 MG PO TABS: 650.0000 mg | ORAL_TABLET | ORAL | Status: DC | PRN

## 2015-12-06 MED ORDER — PROPOFOL 10 MG/ML IV BOLUS
INTRAVENOUS | Status: AC
  Filled 2015-12-06: qty 20

## 2015-12-06 MED ORDER — EPHEDRINE 5 MG/ML INJ
INTRAVENOUS | Status: AC
  Filled 2015-12-06: qty 10

## 2015-12-06 MED ORDER — PHENOL 1.4 % MT LIQD: 1.0000 | OROMUCOSAL | Status: DC | PRN

## 2015-12-06 MED ORDER — ROCURONIUM BROMIDE 10 MG/ML (PF) SYRINGE
PREFILLED_SYRINGE | INTRAVENOUS | Status: AC
  Filled 2015-12-06: qty 10

## 2015-12-06 MED ORDER — FENTANYL CITRATE (PF) 100 MCG/2ML IJ SOLN
INTRAMUSCULAR | Status: DC | PRN
  Administered 2015-12-06 (×6): 50 ug via INTRAVENOUS

## 2015-12-06 MED ORDER — MIDAZOLAM HCL 2 MG/2ML IJ SOLN
INTRAMUSCULAR | Status: AC
  Filled 2015-12-06: qty 2

## 2015-12-06 MED ORDER — HYDROMORPHONE HCL 1 MG/ML IJ SOLN
0.2500 mg | INTRAMUSCULAR | Status: DC | PRN
  Administered 2015-12-06: 1 mg via INTRAVENOUS

## 2015-12-06 MED ORDER — GELATIN ABSORBABLE MT POWD
OROMUCOSAL | Status: DC | PRN
  Administered 2015-12-06: 10:00:00 via TOPICAL

## 2015-12-06 MED ORDER — CEFAZOLIN SODIUM-DEXTROSE 2-4 GM/100ML-% IV SOLN
2.0000 g | INTRAVENOUS | Status: AC
  Administered 2015-12-06: 2 g via INTRAVENOUS
  Filled 2015-12-06: qty 100

## 2015-12-06 MED ORDER — 0.9 % SODIUM CHLORIDE (POUR BTL) OPTIME
TOPICAL | Status: DC | PRN
  Administered 2015-12-06: 1000 mL

## 2015-12-06 MED ORDER — VANCOMYCIN HCL 1000 MG IV SOLR
INTRAVENOUS | Status: AC
  Filled 2015-12-06: qty 1000

## 2015-12-06 MED ORDER — PANTOPRAZOLE SODIUM 40 MG PO TBEC
40.0000 mg | DELAYED_RELEASE_TABLET | Freq: Every day | ORAL | Status: DC
  Administered 2015-12-06 – 2015-12-07 (×2): 40 mg via ORAL
  Filled 2015-12-06 (×2): qty 1

## 2015-12-06 SURGICAL SUPPLY — 70 items
ADH SKN CLS APL DERMABOND .7 (GAUZE/BANDAGES/DRESSINGS) ×1
APL SKNCLS STERI-STRIP NONHPOA (GAUZE/BANDAGES/DRESSINGS) ×1
BAG DECANTER FOR FLEXI CONT (MISCELLANEOUS) ×3 IMPLANT
BENZOIN TINCTURE PRP APPL 2/3 (GAUZE/BANDAGES/DRESSINGS) ×3 IMPLANT
BIT DRILL PLIF MAS 5.0MM DISP (DRILL) IMPLANT
BLADE CLIPPER SURG (BLADE) IMPLANT
BONE CANC CHIPS 20CC PCAN1/4 (Bone Implant) ×3 IMPLANT
BUR MATCHSTICK NEURO 3.0 LAGG (BURR) ×3 IMPLANT
CAGE COROENT LG 10X9X23-12 (Cage) ×8 IMPLANT
CANISTER SUCT 3000ML PPV (MISCELLANEOUS) ×3 IMPLANT
CHIPS CANC BONE 20CC PCAN1/4 (Bone Implant) ×1 IMPLANT
CLOSURE WOUND 1/2 X4 (GAUZE/BANDAGES/DRESSINGS) ×1
CONT SPEC 4OZ CLIKSEAL STRL BL (MISCELLANEOUS) ×3 IMPLANT
COVER BACK TABLE 24X17X13 BIG (DRAPES) IMPLANT
COVER BACK TABLE 60X90IN (DRAPES) ×3 IMPLANT
DERMABOND ADVANCED (GAUZE/BANDAGES/DRESSINGS) ×2
DERMABOND ADVANCED .7 DNX12 (GAUZE/BANDAGES/DRESSINGS) ×1 IMPLANT
DRAPE C-ARM 42X72 X-RAY (DRAPES) ×3 IMPLANT
DRAPE C-ARMOR (DRAPES) ×3 IMPLANT
DRAPE LAPAROTOMY 100X72X124 (DRAPES) ×3 IMPLANT
DRAPE POUCH INSTRU U-SHP 10X18 (DRAPES) ×3 IMPLANT
DRAPE SURG 17X23 STRL (DRAPES) ×3 IMPLANT
DRILL PLIF MAS 5.0MM DISP (DRILL) ×3
DRSG OPSITE POSTOP 4X6 (GAUZE/BANDAGES/DRESSINGS) ×2 IMPLANT
DURAPREP 26ML APPLICATOR (WOUND CARE) ×3 IMPLANT
ELECT REM PT RETURN 9FT ADLT (ELECTROSURGICAL) ×3
ELECTRODE REM PT RTRN 9FT ADLT (ELECTROSURGICAL) ×1 IMPLANT
EVACUATOR 1/8 PVC DRAIN (DRAIN) ×1 IMPLANT
GAUZE SPONGE 4X4 16PLY XRAY LF (GAUZE/BANDAGES/DRESSINGS) IMPLANT
GLOVE BIO SURGEON STRL SZ8 (GLOVE) ×9 IMPLANT
GLOVE BIO SURGEON STRL SZ8.5 (GLOVE) ×2 IMPLANT
GLOVE BIOGEL PI IND STRL 6.5 (GLOVE) IMPLANT
GLOVE BIOGEL PI IND STRL 7.5 (GLOVE) IMPLANT
GLOVE BIOGEL PI INDICATOR 6.5 (GLOVE) ×2
GLOVE BIOGEL PI INDICATOR 7.5 (GLOVE) ×2
GLOVE SURG SS PI 6.5 STRL IVOR (GLOVE) ×12 IMPLANT
GOWN STRL REUS W/ TWL LRG LVL3 (GOWN DISPOSABLE) ×1 IMPLANT
GOWN STRL REUS W/ TWL XL LVL3 (GOWN DISPOSABLE) ×3 IMPLANT
GOWN STRL REUS W/TWL 2XL LVL3 (GOWN DISPOSABLE) IMPLANT
GOWN STRL REUS W/TWL LRG LVL3 (GOWN DISPOSABLE) ×3
GOWN STRL REUS W/TWL XL LVL3 (GOWN DISPOSABLE) ×9
HEMOSTAT POWDER KIT SURGIFOAM (HEMOSTASIS) ×3 IMPLANT
KIT BASIN OR (CUSTOM PROCEDURE TRAY) ×3 IMPLANT
KIT ROOM TURNOVER OR (KITS) ×3 IMPLANT
NDL HYPO 25X1 1.5 SAFETY (NEEDLE) ×1 IMPLANT
NEEDLE HYPO 25X1 1.5 SAFETY (NEEDLE) ×3 IMPLANT
NS IRRIG 1000ML POUR BTL (IV SOLUTION) ×3 IMPLANT
PACK LAMINECTOMY NEURO (CUSTOM PROCEDURE TRAY) ×3 IMPLANT
PAD ARMBOARD 7.5X6 YLW CONV (MISCELLANEOUS) ×9 IMPLANT
ROD 55MM (Rod) ×6 IMPLANT
ROD SPNL 55XPREBNT NS MAS (Rod) ×2 IMPLANT
SCREW LOCK (Screw) ×18 IMPLANT
SCREW LOCK FXNS SPNE MAS PL (Screw) IMPLANT
SCREW PLIF MAS 5.0X35 (Screw) ×4 IMPLANT
SCREW SHANK 5.0X35 (Screw) ×4 IMPLANT
SCREW SHANK 6.5X65 (Screw) ×4 IMPLANT
SCREW TULIP 5.5 (Screw) ×8 IMPLANT
SPONGE LAP 4X18 X RAY DECT (DISPOSABLE) IMPLANT
SPONGE SURGIFOAM ABS GEL 100 (HEMOSTASIS) ×3 IMPLANT
STRIP CLOSURE SKIN 1/2X4 (GAUZE/BANDAGES/DRESSINGS) ×3 IMPLANT
SUT VIC AB 0 CT1 18XCR BRD8 (SUTURE) ×1 IMPLANT
SUT VIC AB 0 CT1 8-18 (SUTURE) ×3
SUT VIC AB 2-0 CP2 18 (SUTURE) ×3 IMPLANT
SUT VIC AB 3-0 SH 8-18 (SUTURE) ×6 IMPLANT
SYR 3ML LL SCALE MARK (SYRINGE) IMPLANT
TOWEL OR 17X24 6PK STRL BLUE (TOWEL DISPOSABLE) ×3 IMPLANT
TOWEL OR 17X26 10 PK STRL BLUE (TOWEL DISPOSABLE) ×3 IMPLANT
TRAP SPECIMEN MUCOUS 40CC (MISCELLANEOUS) ×2 IMPLANT
TRAY FOLEY W/METER SILVER 16FR (SET/KITS/TRAYS/PACK) ×3 IMPLANT
WATER STERILE IRR 1000ML POUR (IV SOLUTION) ×3 IMPLANT

## 2015-12-06 NOTE — Op Note (Signed)
12/06/2015  12:45 PM  PATIENT:  Elizabeth Barron  70 y.o. 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 Decompressive lumbar laminectomy L4-5 and L5-S1 requiring more work than would be required for a simple exposure of the disk for PLIF in order to adequately decompress the neural elements and address the spinal stenosis 2. Posterior lumbar interbody fusion L4-5 and L5-S1 using PEEK interbody cages packed with morcellized allograft and autograft 3. Posterior fixation L4-S1 inclusive using cortical pedicle screws.    SURGEON:  Sherley Bounds, MD  ASSISTANTS: Dr. Arnoldo Morale  ANESTHESIA:  General  EBL: 150 ml  Total I/O In: 1400 [I.V.:1400] Out: 295 [Urine:145; Blood:150]  BLOOD ADMINISTERED:none  DRAINS: Hemovac   INDICATION FOR PROCEDURE: This patient presented with a long history of back and leg pain. CT myelogram showed a mobile spondylolisthesis L4-5 and L5-S1 with facet arthropathy and lateral recess stenosis. She tried medical management including injection therapy without relief. I recommended decompression instrumented fusion to address her segmental stability and her debilitating pain. Patient understood the risks, benefits, and alternatives and potential outcomes and wished to proceed.  PROCEDURE DETAILS:  The patient was brought to the operating room. After induction of generalized endotracheal anesthesia the patient was rolled into the prone position on chest rolls and all pressure points were padded. The patient's lumbar region was cleaned and then prepped with DuraPrep and draped in the usual sterile fashion. Anesthesia was injected and then a dorsal midline incision was made and carried down to the lumbosacral fascia. The fascia was opened and the paraspinous musculature was taken down in a subperiosteal fashion to  expose L4-5 and L5-S1. A self-retaining retractor was placed. Intraoperative fluoroscopy confirmed my level, and I started with placement of the L4 cortical pedicle screws. The pedicle screw entry zones were identified utilizing surface landmarks and  AP and lateral fluoroscopy. I scored the cortex with the high-speed drill and then used the hand drill and EMG monitoring to drill an upward and outward direction into the pedicle. I then tapped line to line, and the tap was also monitored. I then placed a 5-0 x 35 mm cortical pedicle screw into the pedicles of L4 bilaterally. I then turned my attention to the decompression  and complete lumbar laminectomies, hemi- facetectomies, and foraminotomies were performed at L4-5 and L5-S1. The patient had significant spinal stenosis and this required more work than would be required for a simple exposure of the disc for posterior lumbar interbody fusion. Much more generous decompression was undertaken in order to adequately decompress the neural elements and address the patient's leg pain. The yellow ligament was removed to expose the underlying dura and nerve roots, and generous foraminotomies were performed to adequately decompress the neural elements. Both the exiting and traversing nerve roots were decompressed on both sides until a coronary dilator passed easily along the nerve roots. Once the decompression was complete, I turned my attention to the posterior lower lumbar interbody fusion. The epidural venous vasculature was coagulated and cut sharply. Disc space was incised and the initial discectomy was performed with pituitary rongeurs. The disc space was distracted with sequential distractors to a height of 10 mm at each level. We then used a series of scrapers and shavers to prepare the endplates for fusion. The midline was prepared with Epstein curettes. Once the complete discectomy was finished, we packed  an appropriate sized peek interbody cage with local  autograft and morcellized allograft, gently retracted the nerve root, and tapped the cage into position at L4-5 and L5-S1.  The midline between the cages was packed with morselized autograft and allograft. We then turned our attention to the placement of the lower pedicle screws. The pedicle screw entry zones were identified utilizing surface landmarks and fluoroscopy. I drilled into each pedicle utilizing the hand drill and EMG monitoring, and tapped each pedicle with the appropriate tap. We palpated with a ball probe to assure no break in the cortex. We then placed 5-0 x 35 mm cortical pedicle screws into the pedicles bilaterally at L5 and 6.5 x 35 mm pedicle screws at S1.  We then placed lordotic rods into the multiaxial screw heads of the pedicle screws and locked these in position with the locking caps and anti-torque device. We then checked our construct with AP and lateral fluoroscopy. Irrigated with copious amounts of bacitracin-containing saline solution. Placed a medium Hemovac drain through separate stab incision. Inspected the nerve roots once again to assure adequate decompression, lined to the dura with Gelfoam, and closed the muscle and the fascia with 0 Vicryl. Closed the subcutaneous tissues with 2-0 Vicryl and subcuticular tissues with 3-0 Vicryl. The skin was closed with benzoin and Steri-Strips. Dressing was then applied, the patient was awakened from general anesthesia and transported to the recovery room in stable condition. At the end of the procedure all sponge, needle and instrument counts were correct.   PLAN OF CARE: Admit to inpatient   PATIENT DISPOSITION:  PACU - hemodynamically stable.   Delay start of Pharmacological VTE agent (>24hrs) due to surgical blood loss or risk of bleeding:  yes

## 2015-12-06 NOTE — Anesthesia Postprocedure Evaluation (Signed)
Anesthesia Post Note  Patient: Elizabeth Barron  Procedure(s) Performed: Procedure(s) (LRB): Lumbar four-five, Lumbar five-Sacral one MAXIMUM ACCESS POSTERIOR LUMBAR INTERBODY FUSION    (N/A)  Patient location during evaluation: PACU Anesthesia Type: General Level of consciousness: awake and alert and oriented Pain management: pain level controlled Vital Signs Assessment: post-procedure vital signs reviewed and stable Respiratory status: spontaneous breathing, nonlabored ventilation, respiratory function stable and patient connected to nasal cannula oxygen Cardiovascular status: blood pressure returned to baseline and stable Postop Assessment: no signs of nausea or vomiting Anesthetic complications: no    Last Vitals:  Vitals:   12/06/15 1327 12/06/15 1343  BP: 123/65 140/77  Pulse: 84 91  Resp: (!) 9 15  Temp:      Last Pain:  Vitals:   12/06/15 1334  TempSrc:   PainSc: Asleep                 Susie Ehresman A.

## 2015-12-06 NOTE — H&P (Signed)
Subjective: Patient is a 70 y.o. female admitted for PLIF. Onset of symptoms was several months ago, gradually worsening since that time.  The pain is rated severe, and is located at the across the lower back and radiates to legs. The pain is described as aching and occurs all day. The symptoms have been progressive. Symptoms are exacerbated by exercise. MRI or CT showed spondylolisthesis with stenosis L4-5, L5-S1  Past Medical History:  Diagnosis Date  . Anemia    Pt. states she is not aware of ever being anemic  . Arthritis    knees. back  . Colon polyps   . GERD (gastroesophageal reflux disease)   . Glaucoma   . Headache   . Hypercholesteremia   . Hypothyroidism   . Insomnia   . Obesity   . Restless leg syndrome     Past Surgical History:  Procedure Laterality Date  . BREAST LUMPECTOMY     L breast- benign  . BUNIONECTOMY Left   . COLONOSCOPY WITH PROPOFOL N/A 10/11/2014   Procedure: COLONOSCOPY WITH PROPOFOL;  Surgeon: Garlan Fair, MD;  Location: [facility];  Service: Endoscopy;  Laterality: N/A;  . KNEE SURGERY     bilateral scope surgery  . THYROIDECTOMY    . TONSILLECTOMY     child  . TUBAL LIGATION    . VAGINAL HYSTERECTOMY      Prior to Admission medications   Medication Sig Start Date End Date Taking? Authorizing Provider  acetaminophen (TYLENOL 8 HOUR ARTHRITIS PAIN) 650 MG CR tablet Take 1,300 mg by mouth daily.   Yes Historical Provider, MD  brinzolamide (AZOPT) 1 % ophthalmic suspension Place 1 drop into both eyes 2 (two) times daily.   Yes Historical Provider, MD  esomeprazole (NEXIUM) 40 MG capsule Take 40 mg by mouth daily. 11/14/15  Yes Historical Provider, MD  HYDROcodone-acetaminophen (VICODIN) 5-500 MG per tablet Take 1 tablet by mouth every 6 (six) hours as needed for pain.    Yes Historical Provider, MD  levothyroxine (SYNTHROID, LEVOTHROID) 175 MCG tablet Take 175 mcg by mouth Daily. 10/22/11  Yes Historical Provider, MD  naproxen sodium (ANAPROX)  220 MG tablet Take 660 mg by mouth daily.   Yes Historical Provider, MD  Vitamin D, Ergocalciferol, (DRISDOL) 50000 UNITS CAPS capsule Take 50,000 Units by mouth every 7 (seven) days.   Yes Historical Provider, MD   Allergies  Allergen Reactions  . No Known Allergies     Social History  Substance Use Topics  . Smoking status: Never Smoker  . Smokeless tobacco: Never Used  . Alcohol use Yes     Comment: OCCASIONAL WINE    Family History  Problem Relation Age of Onset  . Cancer Mother     pancreatic  . Diabetes Mother   . Hypertension Mother   . Cancer Father     esophageal  . Diabetes Father   . Hypertension Father      Review of Systems  Positive ROS: neg  All other systems have been reviewed and were otherwise negative with the exception of those mentioned in the HPI and as above.  Objective: Vital signs in last 24 hours: Temp:  [97.7 F (36.5 C)] 97.7 F (36.5 C) (09/13 0743) Pulse Rate:  [76] 76 (09/13 0743) Resp:  [20] 20 (09/13 0743) BP: (129)/(62) 129/62 (09/13 0743) SpO2:  [97 %] 97 % (09/13 0743) Weight:  [110.2 kg (243 lb)] 110.2 kg (243 lb) (09/13 0743)  General Appearance: Alert, cooperative, no distress, appears  stated age Head: Normocephalic, without obvious abnormality, atraumatic Eyes: PERRL, conjunctiva/corneas clear, EOM's intact    Neck: Supple, symmetrical, trachea midline Back: Symmetric, no curvature, ROM normal, no CVA tenderness Lungs:  respirations unlabored Heart: Regular rate and rhythm Abdomen: Soft, non-tender Extremities: Extremities normal, atraumatic, no cyanosis or edema Pulses: 2+ and symmetric all extremities Skin: Skin color, texture, turgor normal, no rashes or lesions  NEUROLOGIC:   Mental status: Alert and oriented x4,  no aphasia, good attention span, fund of knowledge, and memory Motor Exam - grossly normal Sensory Exam - grossly normal Reflexes: 1+  Coordination - grossly normal Gait - grossly normal Balance -  grossly normal Cranial Nerves: I: smell Not tested  II: visual acuity  OS: nl    OD: nl  II: visual fields Full to confrontation  II: pupils Equal, round, reactive to light  III,VII: ptosis None  III,IV,VI: extraocular muscles  Full ROM  V: mastication Normal  V: facial light touch sensation  Normal  V,VII: corneal reflex  Present  VII: facial muscle function - upper  Normal  VII: facial muscle function - lower Normal  VIII: hearing Not tested  IX: soft palate elevation  Normal  IX,X: gag reflex Present  XI: trapezius strength  5/5  XI: sternocleidomastoid strength 5/5  XI: neck flexion strength  5/5  XII: tongue strength  Normal    Data Review Lab Results  Component Value Date   WBC 7.4 11/29/2015   HGB 14.9 11/29/2015   HCT 47.1 (H) 11/29/2015   MCV 87.9 11/29/2015   PLT 253 11/29/2015   Lab Results  Component Value Date   NA 140 11/29/2015   K 3.9 11/29/2015   CL 106 11/29/2015   CO2 27 11/29/2015   BUN 14 11/29/2015   CREATININE 0.75 11/29/2015   GLUCOSE 99 11/29/2015   Lab Results  Component Value Date   INR 0.97 11/29/2015    Assessment/Plan: Patient admitted for PLIF L45 L5S1. Patient has failed a reasonable attempt at conservative therapy.  I explained the condition and procedure to the patient and answered any questions.  Patient wishes to proceed with procedure as planned. Understands risks/ benefits and typical outcomes of procedure.   Aubreyana Saltz S 12/06/2015 8:46 AM

## 2015-12-06 NOTE — Anesthesia Procedure Notes (Signed)
Procedure Name: Intubation Date/Time: 12/06/2015 9:05 AM Performed by: Rush Farmer E Pre-anesthesia Checklist: Patient identified, Emergency Drugs available, Suction available, Patient being monitored and Timeout performed Patient Re-evaluated:Patient Re-evaluated prior to inductionOxygen Delivery Method: Circle system utilized Preoxygenation: Pre-oxygenation with 100% oxygen Intubation Type: IV induction Ventilation: Mask ventilation without difficulty Laryngoscope Size: Mac and 4 Grade View: Grade I Tube type: Oral Tube size: 7.0 mm Number of attempts: 1 Airway Equipment and Method: Stylet Placement Confirmation: ETT inserted through vocal cords under direct vision,  positive ETCO2 and breath sounds checked- equal and bilateral Secured at: 21 cm Tube secured with: Tape Dental Injury: Teeth and Oropharynx as per pre-operative assessment

## 2015-12-06 NOTE — Transfer of Care (Signed)
Immediate Anesthesia Transfer of Care Note  Patient: Elizabeth Barron  Procedure(s) Performed: Procedure(s): Lumbar four-five, Lumbar five-Sacral one MAXIMUM ACCESS POSTERIOR LUMBAR INTERBODY FUSION    (N/A)  Patient Location: PACU  Anesthesia Type:General  Level of Consciousness: awake and alert   Airway & Oxygen Therapy: Patient Spontanous Breathing and Patient connected to nasal cannula oxygen  Post-op Assessment: Report given to RN, Post -op Vital signs reviewed and stable and Patient moving all extremities X 4  Post vital signs: Reviewed and stable  Last Vitals:  Vitals:   12/06/15 0743  BP: 129/62  Pulse: 76  Resp: 20  Temp: 36.5 C    Last Pain:  Vitals:   12/06/15 0809  TempSrc:   PainSc: 6       Patients Stated Pain Goal: 3 ([id number])  Complications: No apparent anesthesia complications

## 2015-12-07 ENCOUNTER — Encounter (HOSPITAL_COMMUNITY): Payer: Self-pay | Admitting: Neurological Surgery

## 2015-12-07 MED ORDER — METHOCARBAMOL 500 MG PO TABS: 500.0000 mg | ORAL_TABLET | Freq: Four times a day (QID) | ORAL | 1 refills | Status: DC | PRN

## 2015-12-07 MED ORDER — HYDROXYZINE HCL 25 MG PO TABS
50.0000 mg | ORAL_TABLET | ORAL | Status: DC | PRN
  Administered 2015-12-07: 50 mg via ORAL
  Filled 2015-12-07: qty 2

## 2015-12-07 MED ORDER — OXYCODONE-ACETAMINOPHEN 5-325 MG PO TABS: 1.0000 | ORAL_TABLET | Freq: Four times a day (QID) | ORAL | 0 refills | Status: DC | PRN

## 2015-12-07 MED FILL — Heparin Sodium (Porcine) Inj 1000 Unit/ML: INTRAMUSCULAR | Qty: 30 | Status: AC

## 2015-12-07 MED FILL — Sodium Chloride IV Soln 0.9%: INTRAVENOUS | Qty: 1000 | Status: AC

## 2015-12-07 NOTE — Progress Notes (Signed)
Pt. discharged home accompanied by husband. Prescriptions and discharge instructions given with verbalization of understanding. Incision site on back with no s/s of infection - no swelling, redness, bleeding, and/or drainage noted. Opportunity given to ask questions but no question asked. Pt. transported out of this unit in wheelchair by the volunteer

## 2015-12-07 NOTE — Progress Notes (Signed)
Occupational Therapy Evaluation Patient Details Name: Elizabeth Barron MRN: [id number] DOB: [birth year] Today's Date: 12/07/2015    History of Present Illness Pt 70 yo female who underwent PLIF L4-5 and L5-S1.   Clinical Impression   All OT education completed and pt questions answered. No further OT needs at this time. Will sign off.    Follow Up Recommendations  No OT follow up;Supervision/Assistance - 24 hour    Equipment Recommendations  Other (comment) (pt to obtain toilet aide, sock aide, long sponge on their ow)    Recommendations for Other Services       Precautions / Restrictions Precautions Precautions: Fall;Back Precaution Booklet Issued: Yes (comment) Precaution Comments: pt with verbal understanding Required Braces or Orthoses: Spinal Brace Spinal Brace: Lumbar corset;Applied in sitting position Restrictions Weight Bearing Restrictions: No      Mobility Bed Mobility Overal bed mobility: Needs Assistance Bed Mobility: Sit to Sidelying;Rolling Rolling: Min guard      Sit to sidelying: Min assist General bed mobility comments: assistance for legs into bed; vc's for technique to maintain back precautions  Transfers Overall transfer level: Needs assistance Equipment used: Rolling walker (2 wheeled) Transfers: Sit to/from Stand Sit to Stand: Min guard         General transfer comment: v/c's for hand placement, increased time, increased pain but diminished    Balance Overall balance assessment: Modified Independent                                          ADL Overall ADL's : Needs assistance/impaired Eating/Feeding: Independent;Sitting   Grooming: Set up;Sitting       Lower Body Bathing: Moderate assistance;Sit to/from stand Lower Body Bathing Details (indicate cue type and reason): educated on use of long sponge for LB bathing Upper Body Dressing : Minimal assistance;Sitting Upper Body Dressing Details (indicate cue type and  reason): doff brace/robe Lower Body Dressing: Moderate assistance;Cueing for back precautions;Sit to/from stand Lower Body Dressing Details (indicate cue type and reason): educated in use of reacher and sock aide for LB dressing Toilet Transfer: Ambulation;BSC;RW;Min guard   Toileting- Clothing Manipulation and Hygiene: Maximal assistance;Sit to/from stand Toileting - Clothing Manipulation Details (indicate cue type and reason): educated on use of toilet aide and where to purchase Tub/ Shower Transfer: Tub Product manager Details (indicate cue type and reason): demonstrated tub transfer technique to patient/daughter; pt declined to practice Functional mobility during ADLs: Rolling walker;Min guard       Vision     Perception     Praxis      Pertinent Vitals/Pain Pain Assessment: 0-10 Pain Score: 5  Pain Location: back Pain Descriptors / Indicators: Aching;Sore Pain Intervention(s): Monitored during session;Repositioned     Hand Dominance Left   Extremity/Trunk Assessment Upper Extremity Assessment Upper Extremity Assessment: Overall WFL for tasks assessed   Lower Extremity Assessment Lower Extremity Assessment: Defer to PT evaluation   Cervical / Trunk Assessment Cervical / Trunk Assessment: Other exceptions Cervical / Trunk Exceptions: spinal surgery   Communication Communication Communication: No difficulties   Cognition Arousal/Alertness: Awake/alert Behavior During Therapy: WFL for tasks assessed/performed Overall Cognitive Status: Within Functional Limits for tasks assessed                     General Comments       Exercises       Shoulder Instructions  Home Living Family/patient expects to be discharged to:: Private residence Living Arrangements: Spouse/significant other;Children Available Help at Discharge: Family;Available 24 hours/day Type of Home: House Home Access: Stairs to enter [employer] of Steps:  5 Entrance Stairs-Rails: Left Home Layout: Multi-level;Bed/bath upstairs Alternate Level Stairs-Number of Steps: 6 Alternate Level Stairs-Rails: Right Bathroom Shower/Tub: Tub/shower unit Shower/tub characteristics: Door Biochemist, clinical: Standard     Home Equipment: Environmental consultant - 2 wheels;Shower seat;Bedside commode;Adaptive equipment Adaptive Equipment: Reacher        Prior Functioning/Environment Level of Independence: Independent             OT Diagnosis: Acute pain   OT Problem List: Pain;Decreased knowledge of precautions;Decreased knowledge of use of DME or AE;Decreased activity tolerance   OT Treatment/Interventions:      OT Goals(Current goals can be found in the care plan section) Acute Rehab OT Goals Patient Stated Goal: home OT Goal Formulation: All assessment and education complete, DC therapy  OT Frequency:     Barriers to D/C:            Co-evaluation              End of Session Equipment Utilized During Treatment: Back brace;Rolling walker Nurse Communication: Mobility status  Activity Tolerance: Patient tolerated treatment well Patient left: in bed;with call bell/phone within reach;with family/visitor present   Time: [id number] OT Time Calculation (min): 46 min Charges:  OT General Charges $OT Visit: 1 Procedure OT Evaluation $OT Eval Low Complexity: 1 Procedure OT Treatments $Self Care/Home Management : 23-37 mins G-Codes:    Johndaniel Catlin A 12-10-15, 11:33 AM

## 2015-12-07 NOTE — Discharge Summary (Signed)
Physician Discharge Summary  Patient ID: Elizabeth Barron MRN: [id number] DOB/AGE: Aug 20, 1945 70 y.o.  Admit date: 12/06/2015 Discharge date: 12/07/2015  Admission Diagnoses: spondylolisthesis   Discharge Diagnoses: same   Discharged Condition: good  Hospital Course: The patient was admitted on 12/06/2015 and taken to the operating room where the patient underwent PLIF. The patient tolerated the procedure well and was taken to the recovery room and then to the floor in stable condition. The hospital course was routine. There were no complications. The wound remained clean dry and intact. Pt had appropriate back soreness. No complaints of leg pain or new N/T/W. The patient remained afebrile with stable vital signs, and tolerated a regular diet. The patient continued to increase activities, and pain was well controlled with oral pain medications.   Consults: None  Significant Diagnostic Studies:  Results for orders placed or performed during the hospital encounter of 12/06/15  Surgical pcr screen  Result Value Ref Range   MRSA, PCR NEGATIVE NEGATIVE   Staphylococcus aureus NEGATIVE NEGATIVE  Basic metabolic panel  Result Value Ref Range   Sodium 140 135 - 145 mmol/L   Potassium 3.9 3.5 - 5.1 mmol/L   Chloride 106 101 - 111 mmol/L   CO2 27 22 - 32 mmol/L   Glucose, Bld 99 65 - 99 mg/dL   BUN 14 6 - 20 mg/dL   Creatinine, Ser 0.75 0.44 - 1.00 mg/dL   Calcium 9.8 8.9 - 10.3 mg/dL   GFR calc non Af Amer >60 >60 mL/min   GFR calc Af Amer >60 >60 mL/min   Anion gap 7 5 - 15  CBC WITH DIFFERENTIAL  Result Value Ref Range   WBC 7.4 4.0 - 10.5 K/uL   RBC 5.36 (H) 3.87 - 5.11 MIL/uL   Hemoglobin 14.9 12.0 - 15.0 g/dL   HCT 47.1 (H) 36.0 - 46.0 %   MCV 87.9 78.0 - 100.0 fL   MCH 27.8 26.0 - 34.0 pg   MCHC 31.6 30.0 - 36.0 g/dL   RDW 14.5 11.5 - 15.5 %   Platelets 253 150 - 400 K/uL   Neutrophils Relative % 67 %   Neutro Abs 4.9 1.7 - 7.7 K/uL   Lymphocytes Relative 25 %   Lymphs  Abs 1.9 0.7 - 4.0 K/uL   Monocytes Relative 6 %   Monocytes Absolute 0.5 0.1 - 1.0 K/uL   Eosinophils Relative 2 %   Eosinophils Absolute 0.2 0.0 - 0.7 K/uL   Basophils Relative 0 %   Basophils Absolute 0.0 0.0 - 0.1 K/uL  Protime-INR  Result Value Ref Range   Prothrombin Time 12.9 11.4 - 15.2 seconds   INR 0.97   Type and screen  Result Value Ref Range   ABO/RH(D) O POS    Antibody Screen NEG    Sample Expiration 12/13/2015    Extend sample reason NO TRANSFUSIONS OR PREGNANCY IN THE PAST 3 MONTHS     Chest 2 View  Result Date: 11/30/2015 CLINICAL DATA:  Preoperative lumbar spine surgery. Shortness of breath with exertion EXAM: CHEST  2 VIEW COMPARISON:  November 04, 2013 FINDINGS: There is no edema or consolidation. The heart size and pulmonary vascularity are normal. No adenopathy. There is postoperative change in the midline cervical-thoracic junction region. There is degenerative change in each shoulder. IMPRESSION: No edema or consolidation. Electronically Signed   By: Lowella Grip III M.D.   On: 11/30/2015 07:53   Dg Lumbar Spine 2-3 Views  Result Date: 12/06/2015 CLINICAL DATA:  L4-S1 MAS PLIF EXAM: DG C-ARM 61-120 MIN; LUMBAR SPINE - 2-3 VIEW COMPARISON:  None FLUOROSCOPY TIME:  1 minutes 15 seconds FINDINGS: Posterior lumbar interbody fusion from L4 through S1 with interbody cage devices in satisfactory position. Normal alignment. IMPRESSION: Posterior lumbar interbody fusion L4-S1. Electronically Signed   By: Kathreen Devoid   On: 12/06/2015 12:38   Dg C-arm 61-120 Min  Result Date: 12/06/2015 CLINICAL DATA:  L4-S1 MAS PLIF EXAM: DG C-ARM 61-120 MIN; LUMBAR SPINE - 2-3 VIEW COMPARISON:  None FLUOROSCOPY TIME:  1 minutes 15 seconds FINDINGS: Posterior lumbar interbody fusion from L4 through S1 with interbody cage devices in satisfactory position. Normal alignment. IMPRESSION: Posterior lumbar interbody fusion L4-S1. Electronically Signed   By: Kathreen Devoid   On: 12/06/2015 12:38     Antibiotics:  Anti-infectives    Start     Dose/Rate Route Frequency Ordered Stop   12/06/15 1430  ceFAZolin (ANCEF) IVPB 1 g/50 mL premix     1 g 100 mL/hr over 30 Minutes Intravenous Every 8 hours 12/06/15 1417 12/06/15 2143   12/06/15 1002  vancomycin (VANCOCIN) powder  Status:  Discontinued       As needed 12/06/15 1002 12/06/15 1240   12/06/15 0959  bacitracin 50,000 Units in sodium chloride irrigation 0.9 % 500 mL irrigation  Status:  Discontinued       As needed 12/06/15 0959 12/06/15 1240   12/06/15 0845  vancomycin (VANCOCIN) 1000 MG powder    Comments:  Loreli Dollar   : cabinet override      12/06/15 0845 12/06/15 2059   12/06/15 0742  ceFAZolin (ANCEF) IVPB 2g/100 mL premix     2 g 200 mL/hr over 30 Minutes Intravenous On call to O.R. 12/06/15 [id number] 12/06/15 0920      Discharge Exam: Blood pressure 103/65, pulse 84, temperature 98.7 F (37.1 C), resp. rate 18, weight 110.2 kg (243 lb), SpO2 97 %. Neurologic: Grossly normal Dressing dry  Discharge Medications:     Medication List    STOP taking these medications   HYDROcodone-acetaminophen 5-500 MG tablet Commonly known as:  VICODIN   naproxen sodium 220 MG tablet Commonly known as:  ANAPROX     TAKE these medications   brinzolamide 1 % ophthalmic suspension Commonly known as:  AZOPT Place 1 drop into both eyes 2 (two) times daily.   esomeprazole 40 MG capsule Commonly known as:  NEXIUM Take 40 mg by mouth daily.   levothyroxine 175 MCG tablet Commonly known as:  SYNTHROID, LEVOTHROID Take 175 mcg by mouth Daily.   methocarbamol 500 MG tablet Commonly known as:  ROBAXIN Take 1 tablet (500 mg total) by mouth every 6 (six) hours as needed for muscle spasms.   oxyCODONE-acetaminophen 5-325 MG tablet Commonly known as:  PERCOCET/ROXICET Take 1-2 tablets by mouth every 6 (six) hours as needed for moderate pain.   TYLENOL 8 HOUR ARTHRITIS PAIN 650 MG CR tablet Generic drug:  acetaminophen Take  1,300 mg by mouth daily.   Vitamin D (Ergocalciferol) 50000 units Caps capsule Commonly known as:  DRISDOL Take 50,000 Units by mouth every 7 (seven) days.       Disposition: home   Final Dx: PLIF L4-5 L5-s1  Discharge Instructions     Remove dressing in 72 hours    Complete by:  As directed    Call MD for:  difficulty breathing, headache or visual disturbances    Complete by:  As directed    Call MD for:  persistant nausea and vomiting    Complete by:  As directed    Call MD for:  redness, tenderness, or signs of infection (pain, swelling, redness, odor or green/yellow discharge around incision site)    Complete by:  As directed    Call MD for:  severe uncontrolled pain    Complete by:  As directed    Call MD for:  temperature >100.4    Complete by:  As directed    Diet - low sodium heart healthy    Complete by:  As directed    Discharge instructions    Complete by:  As directed    May shower, no bending or twisting, no lifting, no driving   Increase activity slowly    Complete by:  As directed          Signed: Rolande Moe S 12/07/2015, 12:52 PM

## 2015-12-07 NOTE — Evaluation (Signed)
Physical Therapy Evaluation Patient Details Name: Elizabeth Barron MRN: [id number] DOB: 27-Jan-1946 Today's Date: 12/07/2015   History of Present Illness  Pt 70 yo female who underwent PLIF L4-5 and L5-S1.  Clinical Impression  Patient is s/p above surgery resulting in the deficits listed below (see PT Problem List). Pt tolerated mobility well but requires v/c's to adhere back precautions. Patient will benefit from skilled PT to increase their independence and safety with mobility (while adhering to their precautions) to allow discharge to the venue listed below.     Follow Up Recommendations No PT follow up;Supervision/Assistance - 24 hour    Equipment Recommendations  None recommended by PT    Recommendations for Other Services       Precautions / Restrictions Precautions Precautions: Fall;Back Precaution Booklet Issued: Yes (comment) Precaution Comments: pt with verbal understanding Required Braces or Orthoses: Spinal Brace Spinal Brace: Lumbar corset;Applied in sitting position Restrictions Weight Bearing Restrictions: No      Mobility  Bed Mobility Overal bed mobility: Needs Assistance Bed Mobility: Rolling;Sidelying to Sit Rolling: Min guard Sidelying to sit: Min assist       General bed mobility comments: assist for trunk elevation, v/c's for technique to adhere to precautions  Transfers Overall transfer level: Needs assistance Equipment used: Rolling walker (2 wheeled) Transfers: Sit to/from Stand Sit to Stand: Min assist         General transfer comment: v/c's for hand placement, increased time, increased pain but diminished  Ambulation/Gait Ambulation/Gait assistance: Supervision Ambulation Distance (Feet): 150 Feet Assistive device: Rolling walker (2 wheeled) Gait Pattern/deviations: Step-through pattern;Decreased stride length Gait velocity: guarded Gait velocity interpretation: Below normal speed for age/gender General Gait Details: v/c's to relax  shoulders, v/c's to minimize twisting  Stairs Stairs: Yes Stairs assistance: Min guard Stair Management: One rail Left;Sideways Number of Stairs: 5 General stair comments: discussed ascending forwards with "up with the good, down with the bad"  Wheelchair Mobility    Modified Rankin (Stroke Patients Only)       Balance Overall balance assessment: Modified Independent                                           Pertinent Vitals/Pain Pain Assessment: 0-10 Pain Location: 5 Pain Descriptors / Indicators: Sore Pain Intervention(s): Monitored during session    Home Living Family/patient expects to be discharged to:: Private residence Living Arrangements: Spouse/significant other;Children (daughter to stay with her) Available Help at Discharge: Family;Available 24 hours/day Type of Home: House Home Access: Stairs to enter Entrance Stairs-Rails: Left Entrance Stairs-Number of Steps: 5 Home Layout: Multi-level;Bed/bath upstairs Home Equipment: Walker - 2 wheels;Tub bench;Bedside commode      Prior Function Level of Independence: Independent               Hand Dominance   Dominant Hand: Left    Extremity/Trunk Assessment   Upper Extremity Assessment: Overall WFL for tasks assessed           Lower Extremity Assessment: Generalized weakness      Cervical / Trunk Assessment: Other exceptions  Communication   Communication: No difficulties  Cognition Arousal/Alertness: Awake/alert Behavior During Therapy: WFL for tasks assessed/performed Overall Cognitive Status: Within Functional Limits for tasks assessed                      General Comments      Exercises  Assessment/Plan    PT Assessment Patient needs continued PT services  PT Diagnosis Difficulty walking;Generalized weakness;Acute pain   PT Problem List Decreased strength;Decreased range of motion;Decreased activity tolerance;Decreased balance;Decreased  mobility;Decreased knowledge of precautions;Decreased knowledge of use of DME  PT Treatment Interventions DME instruction;Gait training;Stair training;Functional mobility training;Therapeutic activities;Therapeutic exercise   PT Goals (Current goals can be found in the Care Plan section) Acute Rehab PT Goals Patient Stated Goal: home PT Goal Formulation: With patient/family Time For Goal Achievement: 12/14/15 Potential to Achieve Goals: Good    Frequency Min 5X/week   Barriers to discharge        Co-evaluation               End of Session Equipment Utilized During Treatment: Gait belt;Back brace Activity Tolerance: Patient tolerated treatment well Patient left: in chair;with call bell/phone within reach;with family/visitor present Nurse Communication: Mobility status         Time: [id number] PT Time Calculation (min) (ACUTE ONLY): 38 min   Charges:   PT Evaluation $PT Eval Moderate Complexity: 1 Procedure PT Treatments $Gait Training: 8-22 mins $Therapeutic Activity: 8-22 mins   PT G CodesKingsley Callander 12/07/2015, 9:40 AM   Kittie Plater, PT, DPT Pager #: [phone] Office #: [phone]

## 2016-01-30 ENCOUNTER — Other Ambulatory Visit: Payer: Self-pay | Admitting: Neurological Surgery

## 2016-01-30 DIAGNOSIS — M8448XS Pathological fracture, other site, sequela: Secondary | ICD-10-CM

## 2016-01-30 DIAGNOSIS — M7989 Other specified soft tissue disorders: Secondary | ICD-10-CM

## 2016-01-30 DIAGNOSIS — M79605 Pain in left leg: Secondary | ICD-10-CM

## 2016-01-30 DIAGNOSIS — M79604 Pain in right leg: Secondary | ICD-10-CM

## 2016-01-30 DIAGNOSIS — M4316 Spondylolisthesis, lumbar region: Secondary | ICD-10-CM

## 2016-01-31 ENCOUNTER — Ambulatory Visit
Admission: RE | Admit: 2016-01-31 | Discharge: 2016-01-31 | Disposition: A | Discharge status: Home / Self Care | Payer: Medicare Other | Source: Ambulatory Visit | Attending: Neurological Surgery | Admitting: Neurological Surgery

## 2016-01-31 DIAGNOSIS — M8448XS Pathological fracture, other site, sequela: Secondary | ICD-10-CM

## 2016-01-31 DIAGNOSIS — M4316 Spondylolisthesis, lumbar region: Secondary | ICD-10-CM

## 2016-01-31 DIAGNOSIS — M79605 Pain in left leg: Secondary | ICD-10-CM

## 2016-01-31 DIAGNOSIS — M79604 Pain in right leg: Secondary | ICD-10-CM

## 2016-01-31 DIAGNOSIS — M7989 Other specified soft tissue disorders: Secondary | ICD-10-CM

## 2016-02-27 ENCOUNTER — Other Ambulatory Visit: Payer: Self-pay | Admitting: Internal Medicine

## 2016-02-27 DIAGNOSIS — Z1231 Encounter for screening mammogram for malignant neoplasm of breast: Secondary | ICD-10-CM

## 2016-04-08 ENCOUNTER — Ambulatory Visit
Admission: RE | Admit: 2016-04-08 | Discharge: 2016-04-08 | Disposition: A | Discharge status: Home / Self Care | Payer: Medicare Other | Source: Ambulatory Visit | Attending: Internal Medicine | Admitting: Internal Medicine

## 2016-04-08 DIAGNOSIS — Z1231 Encounter for screening mammogram for malignant neoplasm of breast: Secondary | ICD-10-CM

## 2016-04-12 ENCOUNTER — Ambulatory Visit
Admission: RE | Admit: 2016-04-12 | Discharge: 2016-04-12 | Disposition: A | Discharge status: Home / Self Care | Payer: Medicare Other | Source: Ambulatory Visit | Attending: Internal Medicine | Admitting: Internal Medicine

## 2016-04-12 ENCOUNTER — Other Ambulatory Visit: Payer: Self-pay | Admitting: Internal Medicine

## 2016-04-12 DIAGNOSIS — R109 Unspecified abdominal pain: Secondary | ICD-10-CM

## 2016-04-12 DIAGNOSIS — R1084 Generalized abdominal pain: Secondary | ICD-10-CM

## 2016-04-15 ENCOUNTER — Other Ambulatory Visit: Payer: Self-pay | Admitting: Neurological Surgery

## 2016-04-15 DIAGNOSIS — M4316 Spondylolisthesis, lumbar region: Secondary | ICD-10-CM

## 2016-04-17 ENCOUNTER — Other Ambulatory Visit: Payer: Self-pay | Admitting: Neurological Surgery

## 2016-04-17 ENCOUNTER — Ambulatory Visit
Admission: RE | Admit: 2016-04-17 | Discharge: 2016-04-17 | Disposition: A | Discharge status: Home / Self Care | Payer: Medicare Other | Source: Ambulatory Visit | Attending: Neurological Surgery | Admitting: Neurological Surgery

## 2016-04-17 DIAGNOSIS — M4316 Spondylolisthesis, lumbar region: Secondary | ICD-10-CM

## 2016-04-17 MED ORDER — METHYLPREDNISOLONE ACETATE 40 MG/ML INJ SUSP (RADIOLOG
120.0000 mg | Freq: Once | INTRAMUSCULAR | Status: AC
  Administered 2016-04-17: 120 mg via EPIDURAL

## 2016-04-17 MED ORDER — IOPAMIDOL (ISOVUE-M 200) INJECTION 41%
1.0000 mL | Freq: Once | INTRAMUSCULAR | Status: AC
  Administered 2016-04-17: 1 mL via EPIDURAL

## 2016-04-17 NOTE — Discharge Instructions (Signed)

## 2016-04-18 ENCOUNTER — Ambulatory Visit
Admission: RE | Admit: 2016-04-18 | Discharge: 2016-04-18 | Disposition: A | Discharge status: Home / Self Care | Payer: Medicare Other | Source: Ambulatory Visit | Attending: Internal Medicine | Admitting: Internal Medicine

## 2016-04-18 DIAGNOSIS — R109 Unspecified abdominal pain: Secondary | ICD-10-CM

## 2016-05-08 ENCOUNTER — Other Ambulatory Visit: Payer: Self-pay | Admitting: Neurological Surgery

## 2016-05-08 DIAGNOSIS — M79605 Pain in left leg: Secondary | ICD-10-CM

## 2016-05-15 ENCOUNTER — Other Ambulatory Visit: Payer: Medicare Other

## 2016-05-21 ENCOUNTER — Ambulatory Visit
Admission: RE | Admit: 2016-05-21 | Discharge: 2016-05-21 | Disposition: A | Discharge status: Home / Self Care | Payer: Medicare Other | Source: Ambulatory Visit | Attending: Neurological Surgery | Admitting: Neurological Surgery

## 2016-05-21 VITALS — BP 91/56 | HR 62

## 2016-05-21 DIAGNOSIS — M79605 Pain in left leg: Secondary | ICD-10-CM

## 2016-05-21 DIAGNOSIS — Z981 Arthrodesis status: Secondary | ICD-10-CM

## 2016-05-21 MED ORDER — DIAZEPAM 5 MG PO TABS
5.0000 mg | ORAL_TABLET | Freq: Once | ORAL | Status: AC
  Administered 2016-05-21: 5 mg via ORAL

## 2016-05-21 MED ORDER — IOPAMIDOL (ISOVUE-M 200) INJECTION 41%
15.0000 mL | Freq: Once | INTRAMUSCULAR | Status: AC
  Administered 2016-05-21: 15 mL via INTRATHECAL

## 2016-05-21 MED ORDER — ONDANSETRON HCL 4 MG/2ML IJ SOLN
4.0000 mg | Freq: Once | INTRAMUSCULAR | Status: AC
  Administered 2016-05-21: 4 mg via INTRAMUSCULAR

## 2016-05-21 MED ORDER — MEPERIDINE HCL 100 MG/ML IJ SOLN
75.0000 mg | Freq: Once | INTRAMUSCULAR | Status: AC
  Administered 2016-05-21: 50 mg via INTRAMUSCULAR

## 2016-05-21 NOTE — Discharge Instructions (Signed)

## 2016-08-14 ENCOUNTER — Other Ambulatory Visit (HOSPITAL_COMMUNITY): Payer: Self-pay | Admitting: Neurological Surgery

## 2016-08-14 DIAGNOSIS — M25552 Pain in left hip: Secondary | ICD-10-CM

## 2016-09-03 ENCOUNTER — Encounter (HOSPITAL_COMMUNITY): Payer: Self-pay | Admitting: *Deleted

## 2016-09-04 NOTE — Progress Notes (Signed)
Call placed to [facility] requested most recent labs. Staff member in medical records states they will fax over the results.

## 2016-09-05 ENCOUNTER — Ambulatory Visit (HOSPITAL_COMMUNITY)
Admission: RE | Admit: 2016-09-05 | Discharge: 2016-09-05 | Disposition: A | Discharge status: Home / Self Care | Payer: Medicare Other | Source: Ambulatory Visit | Attending: Neurological Surgery | Admitting: Neurological Surgery

## 2016-09-05 ENCOUNTER — Encounter (HOSPITAL_COMMUNITY)
Admission: RE | Disposition: A | Discharge status: Home / Self Care | Payer: Self-pay | Source: Ambulatory Visit | Attending: Neurological Surgery

## 2016-09-05 ENCOUNTER — Ambulatory Visit (HOSPITAL_COMMUNITY): Payer: Medicare Other | Admitting: Certified Registered"

## 2016-09-05 ENCOUNTER — Encounter (HOSPITAL_COMMUNITY): Payer: Self-pay | Admitting: Certified Registered"

## 2016-09-05 DIAGNOSIS — K219 Gastro-esophageal reflux disease without esophagitis: Secondary | ICD-10-CM | POA: Insufficient documentation

## 2016-09-05 DIAGNOSIS — M25552 Pain in left hip: Secondary | ICD-10-CM | POA: Insufficient documentation

## 2016-09-05 DIAGNOSIS — E039 Hypothyroidism, unspecified: Secondary | ICD-10-CM | POA: Diagnosis not present

## 2016-09-05 DIAGNOSIS — Z7982 Long term (current) use of aspirin: Secondary | ICD-10-CM | POA: Insufficient documentation

## 2016-09-05 DIAGNOSIS — Z79899 Other long term (current) drug therapy: Secondary | ICD-10-CM | POA: Insufficient documentation

## 2016-09-05 HISTORY — PX: RADIOLOGY WITH ANESTHESIA: SHX6223

## 2016-09-05 LAB — BASIC METABOLIC PANEL
Anion gap: 8 (ref 5–15)
BUN: 15 mg/dL (ref 6–20)
CO2: 24 mmol/L (ref 22–32)
Calcium: 9.6 mg/dL (ref 8.9–10.3)
Chloride: 106 mmol/L (ref 101–111)
Creatinine, Ser: 0.72 mg/dL (ref 0.44–1.00)
GFR calc Af Amer: >60 mL/min (ref >60)
GFR calc non Af Amer: >60 mL/min (ref >60)
Glucose, Bld: 103 mg/dL — ABNORMAL HIGH (ref 65–99)
Potassium: 3.9 mmol/L (ref 3.5–5.1)
Sodium: 138 mmol/L (ref 135–145)

## 2016-09-05 LAB — CBC
HCT: 45.5 % (ref 36.0–46.0)
Hemoglobin: 14.9 g/dL (ref 12.0–15.0)
MCH: 28.3 pg (ref 26.0–34.0)
MCHC: 32.7 g/dL (ref 30.0–36.0)
MCV: 86.3 fL (ref 78.0–100.0)
Platelets: 246 10*3/uL (ref 150–400)
RBC: 5.27 MIL/uL — ABNORMAL HIGH (ref 3.87–5.11)
RDW: 14.5 % (ref 11.5–15.5)
WBC: 6.5 10*3/uL (ref 4.0–10.5)

## 2016-09-05 SURGERY — RADIOLOGY WITH ANESTHESIA
Anesthesia: General | Laterality: Left

## 2016-09-05 MED ORDER — MEPERIDINE HCL 25 MG/ML IJ SOLN: 6.2500 mg | INTRAMUSCULAR | Status: DC | PRN

## 2016-09-05 MED ORDER — HYDROMORPHONE HCL 1 MG/ML IJ SOLN
0.2500 mg | INTRAMUSCULAR | Status: DC | PRN
  Administered 2016-09-05: 0.5 mg via INTRAVENOUS

## 2016-09-05 MED ORDER — HYDROMORPHONE HCL 1 MG/ML IJ SOLN
INTRAMUSCULAR | Status: AC
  Filled 2016-09-05: qty 0.5

## 2016-09-05 MED ORDER — HYDROCODONE-ACETAMINOPHEN 10-325 MG PO TABS: 1.0000 | ORAL_TABLET | Freq: Once | ORAL | Status: DC

## 2016-09-05 MED ORDER — ONDANSETRON HCL 4 MG/2ML IJ SOLN
INTRAMUSCULAR | Status: AC
  Filled 2016-09-05: qty 2

## 2016-09-05 MED ORDER — HYDROCODONE-ACETAMINOPHEN 10-325 MG PO TABS
1.0000 | ORAL_TABLET | Freq: Once | ORAL | Status: AC
  Administered 2016-09-05: 1 via ORAL

## 2016-09-05 MED ORDER — ONDANSETRON HCL 4 MG/2ML IJ SOLN
4.0000 mg | Freq: Once | INTRAMUSCULAR | Status: AC | PRN
Start: 2016-09-05 — End: 2016-09-05
  Administered 2016-09-05: 4 mg via INTRAVENOUS

## 2016-09-05 MED ORDER — LACTATED RINGERS IV SOLN
INTRAVENOUS | Status: DC
  Administered 2016-09-05: 08:00:00 via INTRAVENOUS

## 2016-09-05 NOTE — Transfer of Care (Signed)
Immediate Anesthesia Transfer of Care Note  Patient: Elizabeth Barron  Procedure(s) Performed: Procedure(s): MRI OF LEFT HIP WITHOUT (Left)  Patient Location: PACU  Anesthesia Type:General  Level of Consciousness: awake, alert  and oriented  Airway & Oxygen Therapy: Patient Spontanous Breathing and Patient connected to nasal cannula oxygen  Post-op Assessment: Report given to RN, Post -op Vital signs reviewed and stable and Patient moving all extremities X 4  Post vital signs: Reviewed and stable  Last Vitals:  Vitals:   09/05/16 0715 09/05/16 1005  BP: 132/72 (!) 94/56  Pulse: 63   Resp: 20   Temp: 36.4 C 36.5 C    Last Pain:  Vitals:   09/05/16 1005  TempSrc:   PainSc: 10-Worst pain ever      Patients Stated Pain Goal: 3 (41/93/79 0240)  Complications: No apparent anesthesia complications

## 2016-09-05 NOTE — Progress Notes (Signed)
Dr. Conrad [place] at bedside, order given and noted. Okay to go home.

## 2016-09-05 NOTE — Progress Notes (Signed)
Patient arrived to PACU from MRI complaining of pain to her right leg. Katie CRNA states that patient had been complaining of her right leg aching since wake up, contributes it to positioning during MRI. Katie and I assessed patients skin and appears WDL. Pain medication given and heat pack applied. Will notify Dr Conrad [place].

## 2016-09-05 NOTE — Anesthesia Preprocedure Evaluation (Signed)
Anesthesia Evaluation  Patient identified by MRN, date of birth, ID band Patient awake    Reviewed: Allergy & Precautions, NPO status , Patient's Chart, lab work & pertinent test results  Airway Mallampati: II  TM Distance: >3 FB Neck ROM: Full    Dental   Pulmonary    Pulmonary exam normal        Cardiovascular Normal cardiovascular exam     Neuro/Psych    GI/Hepatic GERD  Medicated and Controlled,  Endo/Other    Renal/GU      Musculoskeletal   Abdominal   Peds  Hematology   Anesthesia Other Findings   Reproductive/Obstetrics                             Anesthesia Physical Anesthesia Plan  ASA: II  Anesthesia Plan: General   Post-op Pain Management:    Induction: Intravenous  PONV Risk Score and Plan: 2 and Ondansetron and Dexamethasone  Airway Management Planned: LMA  Additional Equipment:   Intra-op Plan:   Post-operative Plan: Extubation in OR  Informed Consent: I have reviewed the patients History and Physical, chart, labs and discussed the procedure including the risks, benefits and alternatives for the proposed anesthesia with the patient or authorized representative who has indicated his/her understanding and acceptance.     Plan Discussed with: CRNA and Surgeon  Anesthesia Plan Comments:         Anesthesia Quick Evaluation

## 2016-09-05 NOTE — H&P (Signed)
Subjective: Patient is a 71 y.o. female who complains of L hip pain. Onset of symptoms was several months ago, unchanged since that time.  Onset was not related to no known injury. The pain is rated severe, and is located at the across the lower back. The pain is described as aching and occurs all day. The symptoms denies been progressive. Symptoms are exacerbated by exercise. The patient has tried everything.   Past Medical History:  Diagnosis Date  . Arthritis    knees. back  . Colon polyps   . GERD (gastroesophageal reflux disease)   . Glaucoma   . Headache   . Hypercholesteremia   . Hypothyroidism    thyroid removed  . Insomnia   . Obesity   . Restless leg syndrome     Past Surgical History:  Procedure Laterality Date  . BREAST LUMPECTOMY     L breast- benign  . BUNIONECTOMY Left   . COLONOSCOPY WITH PROPOFOL N/A 10/11/2014   Procedure: COLONOSCOPY WITH PROPOFOL;  Surgeon: Garlan Fair, MD;  Location: [facility];  Service: Endoscopy;  Laterality: N/A;  . KNEE SURGERY     bilateral scope surgery  . MAXIMUM ACCESS (MAS)POSTERIOR LUMBAR INTERBODY FUSION (PLIF) 2 LEVEL N/A 12/06/2015   Procedure: Lumbar four-five, Lumbar five-Sacral one MAXIMUM ACCESS POSTERIOR LUMBAR INTERBODY FUSION   ;  Surgeon: Eustace Moore, MD;  Location: [place] [facility];  Service: Neurosurgery;  Laterality: N/A;  . THYROIDECTOMY    . TONSILLECTOMY     child  . TUBAL LIGATION    . VAGINAL HYSTERECTOMY      Allergies  Allergen Reactions  . No Known Allergies     Social History  Substance Use Topics  . Smoking status: Never Smoker  . Smokeless tobacco: Never Used  . Alcohol use Yes     Comment: OCCASIONAL WINE    Family History  Problem Relation Age of Onset  . Cancer Mother        pancreatic  . Diabetes Mother   . Hypertension Mother   . Cancer Father        esophageal  . Diabetes Father   . Hypertension Father    Prior to Admission medications   Medication Sig Start Date End Date  Taking? Authorizing Provider  acetaminophen (TYLENOL 8 HOUR ARTHRITIS PAIN) 650 MG CR tablet Take 1,300 mg by mouth daily as needed for pain.    Yes [provider]  aspirin EC 81 MG tablet Take 81 mg by mouth daily.   Yes [provider]  brinzolamide (AZOPT) 1 % ophthalmic suspension Place 1 drop into both eyes 2 (two) times daily.   Yes [provider]  esomeprazole (NEXIUM) 40 MG capsule Take 40 mg by mouth daily as needed (heartburn).  11/14/15  Yes [provider]  HYDROcodone-acetaminophen (NORCO) 10-325 MG tablet Take 1 tablet by mouth every 4 (four) hours as needed for moderate pain.    Yes [provider]  levothyroxine (SYNTHROID, LEVOTHROID) 175 MCG tablet Take 175 mcg by mouth Daily. 10/22/11  Yes [provider]  naproxen sodium (ANAPROX) 220 MG tablet Take 660 mg by mouth daily as needed (pain).    Yes [provider]  Vitamin D, Ergocalciferol, (DRISDOL) 50000 units CAPS capsule Take 50,000 Units by mouth every Friday.   Yes [provider]     Review of Systems  Positive ROS: neg  All other systems have been reviewed and were otherwise negative with the exception of those  mentioned in the HPI and as above.  Objective: Vital signs in last 24 hours: Temp:  [97.6 F (36.4 C)] 97.6 F (36.4 C) (06/14 0715) Pulse Rate:  [63] 63 (06/14 0715) Resp:  [20] 20 (06/14 0715) BP: (132)/(72) 132/72 (06/14 0715) SpO2:  [98 %] 98 % (06/14 0715) Weight:  [108.9 kg (240 lb)] 108.9 kg (240 lb) (06/14 0715)  General Appearance: Alert, cooperative, no distress, appears stated age Head: Normocephalic, without obvious abnormality, atraumatic Eyes: PERRL, conjunctiva/corneas clear, EOM'Barron intact, fundi benign, both eyes      Ears: Normal TM'Barron and external ear canals, both ears Throat: Lips, mucosa, and tongue normal; teeth and gums normal Neck: Supple, symmetrical, trachea midline, no adenopathy; thyroid: No  enlargement/tenderness/nodules; no carotid bruit or JVD Back: Symmetric, no curvature, ROM normal, no CVA tenderness Lungs: Clear to auscultation bilaterally, respirations unlabored Heart: Regular rate and rhythm, S1 and S2 normal, no murmur, rub or gallop Abdomen: Soft, non-tender, bowel sounds active all four quadrants, no masses, no organomegaly Extremities: Extremities normal, atraumatic, no cyanosis or edema Pulses: 2+ and symmetric all extremities Skin: Skin color, texture, turgor normal, no rashes or lesions  NEUROLOGIC:   Mental status: alert and oriented, no aphasia, good attention span, Fund of knowledge/ memory ok Motor Exam - grossly normal Sensory Exam - grossly normal Reflexes:  Coordination - grossly normal Gait - grossly normal Balance - grossly normal Cranial Nerves: I: smell Not tested  II: visual acuity  OS: na    OD: na  II: visual fields Full to confrontation  II: pupils Equal, round, reactive to light  III,VII: ptosis None  III,IV,VI: extraocular muscles  Full ROM  V: mastication Normal  V: facial light touch sensation  Normal  V,VII: corneal reflex  Present  VII: facial muscle function - upper  Normal  VII: facial muscle function - lower Normal  VIII: hearing Not tested  IX: soft palate elevation  Normal  IX,X: gag reflex Present  XI: trapezius strength  5/5  XI: sternocleidomastoid strength 5/5  XI: neck flexion strength  5/5  XII: tongue strength  Normal    Data Review Lab Results  Component Value Date   WBC 6.5 09/05/2016   HGB 14.9 09/05/2016   HCT 45.5 09/05/2016   MCV 86.3 09/05/2016   PLT 246 09/05/2016   Lab Results  Component Value Date   NA 138 09/05/2016   K 3.9 09/05/2016   CL 106 09/05/2016   CO2 24 09/05/2016   BUN 15 09/05/2016   CREATININE 0.72 09/05/2016   GLUCOSE 103 (H) 09/05/2016   Lab Results  Component Value Date   INR 0.97 11/29/2015    Assessment/Plan: MRI hip under anesthesia   Elizabeth Barron 09/05/2016  9:41 AM

## 2016-09-05 NOTE — Progress Notes (Signed)
Dr Conrad Weingarten coming to bedside to see patient concerning right sided leg pain before discharge home.

## 2016-09-05 NOTE — Anesthesia Postprocedure Evaluation (Signed)
Anesthesia Post Note  Patient: Elizabeth Barron  Procedure(s) Performed: Procedure(s) (LRB): MRI OF LEFT HIP WITHOUT (Left)     Anesthesia Post Evaluation  Last Vitals:  Vitals:   09/05/16 1044 09/05/16 1108  BP: 120/84 126/77  Pulse: 65 69  Resp: 16 16  Temp:      Last Pain:  Vitals:   09/05/16 1044  TempSrc:   PainSc: 9                  Breckin Savannah DAVID

## 2016-09-06 ENCOUNTER — Encounter (HOSPITAL_COMMUNITY): Payer: Self-pay | Admitting: Radiology

## 2017-02-28 ENCOUNTER — Other Ambulatory Visit: Payer: Self-pay | Admitting: Internal Medicine

## 2017-02-28 DIAGNOSIS — Z1231 Encounter for screening mammogram for malignant neoplasm of breast: Secondary | ICD-10-CM

## 2017-04-09 ENCOUNTER — Ambulatory Visit
Admission: RE | Admit: 2017-04-09 | Discharge: 2017-04-09 | Disposition: A | Discharge status: Home / Self Care | Payer: Medicare Other | Source: Ambulatory Visit | Attending: Internal Medicine | Admitting: Internal Medicine

## 2017-04-09 DIAGNOSIS — Z1231 Encounter for screening mammogram for malignant neoplasm of breast: Secondary | ICD-10-CM

## 2017-10-13 ENCOUNTER — Other Ambulatory Visit (HOSPITAL_COMMUNITY): Payer: Self-pay | Admitting: Adult Health

## 2017-10-13 DIAGNOSIS — R1310 Dysphagia, unspecified: Secondary | ICD-10-CM

## 2017-10-21 ENCOUNTER — Ambulatory Visit (HOSPITAL_COMMUNITY)
Admission: RE | Admit: 2017-10-21 | Discharge: 2017-10-21 | Disposition: A | Discharge status: Home / Self Care | Payer: Medicare Other | Source: Ambulatory Visit | Attending: Adult Health | Admitting: Adult Health

## 2017-10-21 ENCOUNTER — Other Ambulatory Visit (HOSPITAL_COMMUNITY): Payer: Self-pay | Admitting: Pediatrics

## 2017-10-21 ENCOUNTER — Ambulatory Visit (HOSPITAL_COMMUNITY)
Admission: RE | Admit: 2017-10-21 | Discharge: 2017-10-21 | Disposition: A | Discharge status: Home / Self Care | Payer: Medicare Other | Source: Ambulatory Visit | Attending: Diagnostic Radiology | Admitting: Diagnostic Radiology

## 2017-10-21 DIAGNOSIS — R52 Pain, unspecified: Secondary | ICD-10-CM

## 2017-10-21 DIAGNOSIS — R1310 Dysphagia, unspecified: Secondary | ICD-10-CM | POA: Insufficient documentation

## 2017-10-21 DIAGNOSIS — H409 Unspecified glaucoma: Secondary | ICD-10-CM | POA: Diagnosis not present

## 2017-10-21 DIAGNOSIS — E669 Obesity, unspecified: Secondary | ICD-10-CM | POA: Insufficient documentation

## 2017-10-21 DIAGNOSIS — E7800 Pure hypercholesterolemia, unspecified: Secondary | ICD-10-CM | POA: Diagnosis not present

## 2017-10-21 DIAGNOSIS — E039 Hypothyroidism, unspecified: Secondary | ICD-10-CM | POA: Diagnosis not present

## 2017-10-21 DIAGNOSIS — G2581 Restless legs syndrome: Secondary | ICD-10-CM | POA: Diagnosis not present

## 2017-10-21 DIAGNOSIS — G4700 Insomnia, unspecified: Secondary | ICD-10-CM | POA: Diagnosis not present

## 2017-10-21 DIAGNOSIS — K219 Gastro-esophageal reflux disease without esophagitis: Secondary | ICD-10-CM | POA: Diagnosis not present

## 2018-03-02 ENCOUNTER — Other Ambulatory Visit: Payer: Self-pay | Admitting: Internal Medicine

## 2018-03-02 DIAGNOSIS — Z1231 Encounter for screening mammogram for malignant neoplasm of breast: Secondary | ICD-10-CM

## 2018-04-10 ENCOUNTER — Ambulatory Visit
Admission: RE | Admit: 2018-04-10 | Discharge: 2018-04-10 | Disposition: A | Discharge status: Home / Self Care | Payer: Medicare Other | Source: Ambulatory Visit | Attending: Internal Medicine | Admitting: Internal Medicine

## 2018-04-10 DIAGNOSIS — Z1231 Encounter for screening mammogram for malignant neoplasm of breast: Secondary | ICD-10-CM

## 2018-05-29 ENCOUNTER — Telehealth: Payer: Self-pay

## 2018-05-29 ENCOUNTER — Ambulatory Visit (HOSPITAL_COMMUNITY)
Admission: RE | Admit: 2018-05-29 | Discharge: 2018-05-29 | Disposition: A | Discharge status: Home / Self Care | Payer: Medicare Other | Source: Ambulatory Visit | Attending: Vascular Surgery | Admitting: Vascular Surgery

## 2018-05-29 ENCOUNTER — Other Ambulatory Visit (HOSPITAL_COMMUNITY): Payer: Self-pay | Admitting: Endocrinology

## 2018-05-29 DIAGNOSIS — R0989 Other specified symptoms and signs involving the circulatory and respiratory systems: Secondary | ICD-10-CM | POA: Diagnosis not present

## 2018-05-29 NOTE — Telephone Encounter (Signed)
Patient called, had ABI done today. Is now having great amount of pain in her shoulder and neck that is not resolving. Instructed patient to go to ED.

## 2018-08-20 ENCOUNTER — Telehealth: Payer: Self-pay | Admitting: Diagnostic Neuroimaging

## 2018-08-20 ENCOUNTER — Encounter: Payer: Self-pay | Admitting: Diagnostic Neuroimaging

## 2018-08-20 NOTE — Telephone Encounter (Signed)
Spoke with patient and updated EMR. 

## 2018-08-20 NOTE — Addendum Note (Signed)
Addended by: Minna Antis on: 08/20/2018 01:21 PM   Modules accepted: Orders

## 2018-08-20 NOTE — Telephone Encounter (Signed)
Pt gave consent for VV on the phone/ Pt understands that although there may be some limitations with this type of visit, we will take all precautions to reduce any security or privacy concerns.  Pt understands that this will be treated like an in office visit and we will file with pt's insurance, and there may be a patient responsible charge related to this service. Sent email w link to [email or web address]

## 2018-08-24 ENCOUNTER — Ambulatory Visit (INDEPENDENT_AMBULATORY_CARE_PROVIDER_SITE_OTHER): Payer: Medicare Other | Admitting: Diagnostic Neuroimaging

## 2018-08-24 ENCOUNTER — Other Ambulatory Visit: Payer: Self-pay

## 2018-08-24 ENCOUNTER — Encounter: Payer: Self-pay | Admitting: Diagnostic Neuroimaging

## 2018-08-24 DIAGNOSIS — M79605 Pain in left leg: Secondary | ICD-10-CM | POA: Diagnosis not present

## 2018-08-24 NOTE — Progress Notes (Signed)
[facility]  PATIENT: Elizabeth Barron DOB: 1945/10/02  REFERRING CLINICIAN: Delilah Shan HISTORY FROM: patient  REASON FOR VISIT: new consult   HISTORICAL  CHIEF COMPLAINT:  Chief Complaint  Patient presents with  . Pain    HISTORY OF PRESENT ILLNESS:   73 year old female here for evaluation of left leg pain.  January 2020 patient had onset of left leg pain into her calf on the left side, typically when she would lay down in her bed on her left side.  Sometimes this affects her on her right leg when she lays down on her right side.  Patient does not typically lay on her back when she sleeps as a result of history of low back pain status post surgery in 2017.  Patient's low back pain is improved since her surgery.  Patient saw endocrinologist Dr. Forde Dandy, who referred patient to Dr. Delilah Shan for left leg pain.  Patient had some x-rays of the left leg which apparently were unremarkable.  He started her on gabapentin 300 mg at bedtime, with mild relief.  He referred patient here for consideration of possible restless leg syndrome.  Patient describes "pain" in the left lateral leg between the ankle and knee.  Patient denies any uneasy, creepy crawly, wiggling or writhing discomfort in her legs.  No numbness or tingling in her legs.  Symptoms are slightly alleviated when she sits up at the side of the bed.  She does not rock or massage her legs for alleviation of symptoms.    REVIEW OF SYSTEMS: Full 14 system review of systems performed and negative with exception of: As per HPI.   ALLERGIES: Allergies  Allergen Reactions  . No Known Allergies     HOME MEDICATIONS: Outpatient Medications Prior to Visit  Medication Sig Dispense Refill  . acetaminophen (TYLENOL 8 HOUR ARTHRITIS PAIN) 650 MG CR tablet Take 1,300 mg by mouth daily as needed for pain.     Marland Kitchen aspirin EC 81 MG tablet Take 81 mg by mouth daily.    . brinzolamide (AZOPT) 1 % ophthalmic suspension Place  1 drop into both eyes 2 (two) times daily.    Marland Kitchen esomeprazole (NEXIUM) 40 MG capsule Take 40 mg by mouth daily as needed (heartburn).   3  . gabapentin (NEURONTIN) 300 MG capsule gabapentin 300 mg capsule  TK 1 C PO QD IN THE EVE    . levothyroxine (SYNTHROID, LEVOTHROID) 175 MCG tablet Take 175 mcg by mouth Daily.    . naproxen sodium (ANAPROX) 220 MG tablet Take 660 mg by mouth daily as needed (pain).     . rosuvastatin (CRESTOR) 5 MG tablet TK 1 T PO 2 TIMES A WK    . Vitamin D, Ergocalciferol, (DRISDOL) 50000 units CAPS capsule Take 50,000 Units by mouth every Friday.     No facility-administered medications prior to visit.     PAST MEDICAL HISTORY: Past Medical History:  Diagnosis Date  . Arthritis    knees. back  . Colon polyps   . GERD (gastroesophageal reflux disease)   . Glaucoma   . Headache   . Hypercholesteremia   . Hypothyroidism    thyroid removed  . Insomnia   . Low back pain   . Obesity   . Osteoarthritis    r hip, left hip  . Restless leg syndrome     PAST SURGICAL HISTORY: Past Surgical History:  Procedure Laterality Date  . ANTERIOR CRUCIATE LIGAMENT REPAIR    . BREAST LUMPECTOMY  L breast- benign  . BUNIONECTOMY Left 2012  . COLONOSCOPY WITH PROPOFOL N/A 10/11/2014   Procedure: COLONOSCOPY WITH PROPOFOL;  Surgeon: Garlan Fair, MD;  Location: [facility];  Service: Endoscopy;  Laterality: N/A;  . KNEE SURGERY     bilateral scope surgery  . MAXIMUM ACCESS (MAS)POSTERIOR LUMBAR INTERBODY FUSION (PLIF) 2 LEVEL N/A 12/06/2015   Procedure: Lumbar four-five, Lumbar five-Sacral one MAXIMUM ACCESS POSTERIOR LUMBAR INTERBODY FUSION   ;  Surgeon: Eustace Moore, MD;  Location: [facility];  Service: Neurosurgery;  Laterality: N/A;  . RADIOLOGY WITH ANESTHESIA Left 09/05/2016   Procedure: MRI OF LEFT HIP WITHOUT;  Surgeon: Radiologist, Medication, MD;  Location: [facility];  Service: Radiology;  Laterality: Left;  . THYROIDECTOMY    . TONSILLECTOMY  1955    child  . TUBAL LIGATION    . VAGINAL HYSTERECTOMY  1981    FAMILY HISTORY: Family History  Problem Relation Age of Onset  . Cancer Mother        pancreatic  . Diabetes Mother   . Hypertension Mother   . Cancer Father        esophageal  . Diabetes Father   . Hypertension Father   . Diabetes Sister   . Other Sister        dialysis    SOCIAL HISTORY: Social History   Socioeconomic History  . Marital status: Married    Spouse name: Not on file  . Number of children: 1  . Years of education: Not on file  . Highest education level: High school graduate  Occupational History    Employer: RETIRED  Social Needs  . Financial resource strain: Not on file  . Food insecurity:    Worry: Not on file    Inability: Not on file  . Transportation needs:    Medical: Not on file    Non-medical: Not on file  Tobacco Use  . Smoking status: Never Smoker  . Smokeless tobacco: Never Used  Substance and Sexual Activity  . Alcohol use: Yes    Comment: OCCASIONAL WINE  . Drug use: No  . Sexual activity: Not on file  Lifestyle  . Physical activity:    Days per week: Not on file    Minutes per session: Not on file  . Stress: Not on file  Relationships  . Social connections:    Talks on phone: Not on file    Gets together: Not on file    Attends religious service: Not on file    Active member of club or organization: Not on file    Attends meetings of clubs or organizations: Not on file    Relationship status: Not on file  . Intimate partner violence:    Fear of current or ex partner: Not on file    Emotionally abused: Not on file    Physically abused: Not on file    Forced sexual activity: Not on file  Other Topics Concern  . Not on file  Social History Narrative   Lives with souse   caffeine none     PHYSICAL EXAM  VIDEO EXAM  GENERAL EXAM/CONSTITUTIONAL:  Vitals: There were no vitals filed for this visit.  There is no height or weight on file to calculate BMI. Wt  Readings from Last 3 Encounters:  09/05/16 240 lb (108.9 kg)  12/06/15 243 lb (110.2 kg)  11/29/15 243 lb 4.8 oz (110.4 kg)     Patient is in no distress; well developed, nourished  and groomed; neck is supple   NEUROLOGIC: MENTAL STATUS:  No flowsheet data found.  awake, alert, oriented to person, place and time  recent and remote memory intact  normal attention and concentration  language fluent, comprehension intact, naming intact  fund of knowledge appropriate  CRANIAL NERVE:   2nd, 3rd, 4th, 6th - visual fields full to confrontation, extraocular muscles intact, no nystagmus  5th - facial sensation symmetric  7th - facial strength symmetric  8th - hearing intact  11th - shoulder shrug symmetric  12th - tongue protrusion midline  MOTOR:   NO TREMOR; NO DRIFT IN BUE  SENSORY:   normal and symmetric to light touch  COORDINATION:   fine finger movements normal     DIAGNOSTIC DATA (LABS, IMAGING, TESTING) - I reviewed patient records, labs, notes, testing and imaging myself where available.  Lab Results  Component Value Date   WBC 6.5 09/05/2016   HGB 14.9 09/05/2016   HCT 45.5 09/05/2016   MCV 86.3 09/05/2016   PLT 246 09/05/2016      Component Value Date/Time   NA 138 09/05/2016 0656   K 3.9 09/05/2016 0656   CL 106 09/05/2016 0656   CO2 24 09/05/2016 0656   GLUCOSE 103 (H) 09/05/2016 0656   BUN 15 09/05/2016 0656   CREATININE 0.72 09/05/2016 0656   CALCIUM 9.6 09/05/2016 0656   GFRNONAA >60 09/05/2016 0656   GFRAA >60 09/05/2016 0656   No results found for: CHOL, HDL, LDLCALC, LDLDIRECT, TRIG, CHOLHDL No results found for: HGBA1C No results found for: VITAMINB12 No results found for: TSH     ASSESSMENT AND PLAN  73 y.o. year old female here with left leg pain since January 2020; history of low back pain and surgery in 2017.   Dx:  1. Left leg pain      Virtual Visit via Video Note  I connected with Momo Braun Valvo  on 08/24/18 at  2:30 PM EDT by a video enabled telemedicine application and verified that I am speaking with the correct person using two identifiers.  Location: Patient: home Provider: office    I discussed the limitations of evaluation and management by telemedicine and the availability of in person appointments. The patient expressed understanding and agreed to proceed.   I discussed the assessment and treatment plan with the patient. The patient was provided an opportunity to ask questions and all were answered. The patient agreed with the plan and demonstrated an understanding of the instructions.   The patient was advised to call back or seek an in-person evaluation if the symptoms worsen or if the condition fails to improve as anticipated.  I provided 30 minutes of non-face-to-face time during this encounter.   PLAN:  LEFT LEG PAIN - could be related intermittent compression peripheral neuropathy, musculoskeletal, or lumbar radiculopathy; patient does not want to pursue surgical mgmt; EMG not likely to further help with clinical mgmt - agree with gabapentin; consider PT evaluation and pain mgmt referral  Return for return to PCP, pending if symptoms worsen or fail to improve.    Penni Bombard, MD 9/0/2409, 7:35 PM Certified in Neurology, Neurophysiology and Neuroimaging  [facility] [address], [place], [place] [address] [phone]

## 2019-03-05 ENCOUNTER — Other Ambulatory Visit: Payer: Self-pay | Admitting: Internal Medicine

## 2019-03-05 DIAGNOSIS — Z1231 Encounter for screening mammogram for malignant neoplasm of breast: Secondary | ICD-10-CM

## 2019-04-23 ENCOUNTER — Ambulatory Visit: Payer: Medicare Other

## 2019-05-05 ENCOUNTER — Other Ambulatory Visit: Payer: Self-pay

## 2019-05-05 ENCOUNTER — Ambulatory Visit
Admission: RE | Admit: 2019-05-05 | Discharge: 2019-05-05 | Disposition: A | Discharge status: Home / Self Care | Payer: Medicare Other | Source: Ambulatory Visit | Attending: Internal Medicine | Admitting: Internal Medicine

## 2019-05-05 DIAGNOSIS — Z1231 Encounter for screening mammogram for malignant neoplasm of breast: Secondary | ICD-10-CM

## 2019-06-04 ENCOUNTER — Other Ambulatory Visit: Payer: Self-pay | Admitting: Internal Medicine

## 2019-06-04 ENCOUNTER — Ambulatory Visit
Admission: RE | Admit: 2019-06-04 | Discharge: 2019-06-04 | Disposition: A | Discharge status: Home / Self Care | Payer: Medicare Other | Source: Ambulatory Visit | Attending: Internal Medicine | Admitting: Internal Medicine

## 2019-06-04 DIAGNOSIS — R110 Nausea: Secondary | ICD-10-CM

## 2019-06-04 DIAGNOSIS — K5900 Constipation, unspecified: Secondary | ICD-10-CM

## 2019-06-04 DIAGNOSIS — R141 Gas pain: Secondary | ICD-10-CM

## 2019-06-11 ENCOUNTER — Ambulatory Visit
Admission: RE | Admit: 2019-06-11 | Discharge: 2019-06-11 | Disposition: A | Discharge status: Home / Self Care | Payer: Medicare Other | Source: Ambulatory Visit | Attending: Internal Medicine | Admitting: Internal Medicine

## 2019-06-11 DIAGNOSIS — R110 Nausea: Secondary | ICD-10-CM

## 2019-06-22 ENCOUNTER — Emergency Department (HOSPITAL_COMMUNITY)
Admission: EM | Admit: 2019-06-22 | Discharge: 2019-06-22 | Disposition: A | Discharge status: Home / Self Care | Payer: Medicare Other | Attending: Emergency Medicine | Admitting: Emergency Medicine

## 2019-06-22 ENCOUNTER — Emergency Department (HOSPITAL_COMMUNITY): Payer: Medicare Other

## 2019-06-22 ENCOUNTER — Encounter (HOSPITAL_COMMUNITY): Payer: Self-pay | Admitting: Emergency Medicine

## 2019-06-22 ENCOUNTER — Other Ambulatory Visit: Payer: Self-pay

## 2019-06-22 DIAGNOSIS — R110 Nausea: Secondary | ICD-10-CM | POA: Insufficient documentation

## 2019-06-22 DIAGNOSIS — R1013 Epigastric pain: Secondary | ICD-10-CM | POA: Diagnosis not present

## 2019-06-22 DIAGNOSIS — Z79899 Other long term (current) drug therapy: Secondary | ICD-10-CM | POA: Insufficient documentation

## 2019-06-22 DIAGNOSIS — Z20822 Contact with and (suspected) exposure to covid-19: Secondary | ICD-10-CM | POA: Diagnosis not present

## 2019-06-22 DIAGNOSIS — R531 Weakness: Secondary | ICD-10-CM | POA: Diagnosis not present

## 2019-06-22 DIAGNOSIS — Z6841 Body Mass Index (BMI) 40.0 and over, adult: Secondary | ICD-10-CM | POA: Diagnosis not present

## 2019-06-22 DIAGNOSIS — E669 Obesity, unspecified: Secondary | ICD-10-CM | POA: Diagnosis not present

## 2019-06-22 DIAGNOSIS — E038 Other specified hypothyroidism: Secondary | ICD-10-CM | POA: Insufficient documentation

## 2019-06-22 DIAGNOSIS — Z7982 Long term (current) use of aspirin: Secondary | ICD-10-CM | POA: Diagnosis not present

## 2019-06-22 LAB — BASIC METABOLIC PANEL
Anion gap: 8 (ref 5–15)
BUN: 8 mg/dL (ref 8–23)
CO2: 28 mmol/L (ref 22–32)
Calcium: 9.6 mg/dL (ref 8.9–10.3)
Chloride: 105 mmol/L (ref 98–111)
Creatinine, Ser: 0.82 mg/dL (ref 0.44–1.00)
GFR calc Af Amer: >60 mL/min (ref >60)
GFR calc non Af Amer: >60 mL/min (ref >60)
Glucose, Bld: 118 mg/dL — ABNORMAL HIGH (ref 70–99)
Potassium: 4.3 mmol/L (ref 3.5–5.1)
Sodium: 141 mmol/L (ref 135–145)

## 2019-06-22 LAB — CBC
HCT: 49.2 % — ABNORMAL HIGH (ref 36.0–46.0)
Hemoglobin: 15 g/dL (ref 12.0–15.0)
MCH: 27.4 pg (ref 26.0–34.0)
MCHC: 30.5 g/dL (ref 30.0–36.0)
MCV: 89.8 fL (ref 80.0–100.0)
Platelets: 238 10*3/uL (ref 150–400)
RBC: 5.48 MIL/uL — ABNORMAL HIGH (ref 3.87–5.11)
RDW: 14.5 % (ref 11.5–15.5)
WBC: 7.5 10*3/uL (ref 4.0–10.5)
nRBC: 0 % (ref 0.0–0.2)

## 2019-06-22 LAB — LIPASE, BLOOD: Lipase: 17 U/L (ref 11–51)

## 2019-06-22 LAB — TROPONIN I (HIGH SENSITIVITY)
Troponin I (High Sensitivity): 5 ng/L (ref <18)
Troponin I (High Sensitivity): 4 ng/L (ref <18)

## 2019-06-22 LAB — POC SARS CORONAVIRUS 2 AG -  ED: SARS Coronavirus 2 Ag: NEGATIVE

## 2019-06-22 MED ORDER — PROMETHAZINE HCL 25 MG/ML IJ SOLN
12.5000 mg | Freq: Once | INTRAMUSCULAR | Status: AC
  Administered 2019-06-22: 12.5 mg via INTRAVENOUS
  Filled 2019-06-22: qty 1

## 2019-06-22 MED ORDER — MORPHINE SULFATE (PF) 4 MG/ML IV SOLN
4.0000 mg | Freq: Once | INTRAVENOUS | Status: AC
  Administered 2019-06-22: 4 mg via INTRAVENOUS
  Filled 2019-06-22: qty 1

## 2019-06-22 MED ORDER — FAMOTIDINE IN NACL 20-0.9 MG/50ML-% IV SOLN
20.0000 mg | Freq: Once | INTRAVENOUS | Status: AC
  Administered 2019-06-22: 20 mg via INTRAVENOUS
  Filled 2019-06-22: qty 50

## 2019-06-22 MED ORDER — IOHEXOL 300 MG/ML  SOLN
100.0000 mL | Freq: Once | INTRAMUSCULAR | Status: AC | PRN
  Administered 2019-06-22: 100 mL via INTRAVENOUS

## 2019-06-22 MED ORDER — SODIUM CHLORIDE 0.9 % IV BOLUS
1000.0000 mL | Freq: Once | INTRAVENOUS | Status: AC
  Administered 2019-06-22: 1000 mL via INTRAVENOUS

## 2019-06-22 MED ORDER — METOCLOPRAMIDE HCL 10 MG PO TABS: 10.0000 mg | ORAL_TABLET | Freq: Four times a day (QID) | ORAL | 0 refills | Status: DC

## 2019-06-22 NOTE — ED Triage Notes (Signed)
Pt endorses nausea for 3 weeks and some gas. States she feels like she has a band around her chest

## 2019-06-22 NOTE — Discharge Instructions (Signed)
Take the medication as prescribed.  Follow-up outpatient for reevaluation  Return for new or worsening symptoms

## 2019-06-22 NOTE — ED Provider Notes (Signed)
[facility] DEPART[facility] Provider Note   CSN: [id number] Arrival date & time: 06/22/19  1015    History Chief Complaint  Patient presents with   Abdominal Pain   Elizabeth Barron is a 74 y.o. female with past medical history significant for arthritis (back), hypercholesterolemia, obesity who presents for evaluation of nausea.  Patient states she has been nauseous x3 weeks.  States she is able to tolerate food or becomes extremely nauseous afterwards.  She has had no emesis.  Was seen by her PCP.  Had occult which was negative and prescribed Zofran.  Patient states the Zofran is not working.  She has pain located to her epigastric area and to her lower chest.  Pain does not radiate into her left arm or left jaw.  No associated diaphoresis.  Patient states she just feels "blah."  Admits to cough which is nonproductive.  No associated shortness of breath.  No known Covid exposures.  Denies headache, lightheadedness, dizziness, facial droop, chest pain, dysuria.  States she normally goes to the bathroom multiple times a day however over the last 3 weeks she is only been going approximately once a day.  Denies any melena or bright red blood per rectum.  No pain with bowel movements.  Denies any alcohol or chronic NSAID use.  Denies additional aggravating or alleviating factors. Admits to feeling bloated and passing flatulence multiple times daily which is abnormal for her.  History obtained from patient and past medical record.  No interpreter is used.  HPI     Past Medical History:  Diagnosis Date   Arthritis    knees. back   Colon polyps    GERD (gastroesophageal reflux disease)    Glaucoma    Headache    Hypercholesteremia    Hypothyroidism    thyroid removed   Insomnia    Low back pain    Obesity    Osteoarthritis    r hip, left hip   Restless leg syndrome     Patient Active Problem List   Diagnosis Date Noted   S/P lumbar spinal  fusion 12/06/2015   Cough 11/28/2011    Past Surgical History:  Procedure Laterality Date   ANTERIOR CRUCIATE LIGAMENT REPAIR     BREAST LUMPECTOMY     L breast- benign   BUNIONECTOMY Left 2012   COLONOSCOPY WITH PROPOFOL N/A 10/11/2014   Procedure: COLONOSCOPY WITH PROPOFOL;  Surgeon: Garlan Fair, MD;  Location: [facility];  Service: Endoscopy;  Laterality: N/A;   KNEE SURGERY     bilateral scope surgery   MAXIMUM ACCESS (MAS)POSTERIOR LUMBAR INTERBODY FUSION (PLIF) 2 LEVEL N/A 12/06/2015   Procedure: Lumbar four-five, Lumbar five-Sacral one MAXIMUM ACCESS POSTERIOR LUMBAR INTERBODY FUSION   ;  Surgeon: Eustace Moore, MD;  Location: [facility];  Service: Neurosurgery;  Laterality: N/A;   RADIOLOGY WITH ANESTHESIA Left 09/05/2016   Procedure: MRI OF LEFT HIP WITHOUT;  Surgeon: Radiologist, Medication, MD;  Location: [facility];  Service: Radiology;  Laterality: Left;   THYROIDECTOMY     TONSILLECTOMY  1955   child   TUBAL LIGATION     VAGINAL HYSTERECTOMY  1981     OB History   No obstetric history on file.     Family History  Problem Relation Age of Onset   Cancer Mother        pancreatic   Diabetes Mother    Hypertension Mother    Cancer Father  esophageal   Diabetes Father    Hypertension Father    Diabetes Sister    Other Sister        dialysis    Social History   Tobacco Use   Smoking status: Never Smoker   Smokeless tobacco: Never Used  Substance Use Topics   Alcohol use: Yes    Comment: OCCASIONAL WINE   Drug use: No    Home Medications Prior to Admission medications   Medication Sig Start Date End Date Taking? Authorizing Provider  acetaminophen (TYLENOL 8 HOUR ARTHRITIS PAIN) 650 MG CR tablet Take 1,300 mg by mouth daily as needed for pain.    Yes [provider]  aspirin EC 81 MG tablet Take 81 mg by mouth daily.   Yes [provider]  brinzolamide (AZOPT) 1 % ophthalmic suspension Place 1 drop  into both eyes 2 (two) times daily.   Yes [provider]  buprenorphine (BUTRANS) 5 MCG/HR PTWK Place 1 patch onto the skin once a week.   Yes [provider]  Cyanocobalamin (VITAMIN B 12) 500 MCG TABS Take 2 tablets by mouth daily.   Yes [provider]  esomeprazole (NEXIUM) 40 MG capsule Take 40 mg by mouth daily.  11/14/15  Yes [provider]  gabapentin (NEURONTIN) 300 MG capsule Take 300 mg by mouth at bedtime.    Yes [provider]  levothyroxine (SYNTHROID, LEVOTHROID) 175 MCG tablet Take 175 mcg by mouth See admin instructions. Take 1 tablet Monday through Friday, 1/2 tablet on Saturday, and no tablets on Sunday 10/22/11  Yes [provider]  ondansetron (ZOFRAN) 4 MG tablet Take 4 mg by mouth 2 (two) times daily as needed for nausea or vomiting.   Yes [provider]  rosuvastatin (CRESTOR) 5 MG tablet Take 1 tablet by mouth 2 (two) times a week. On Sunday and Wednesday 01/09/19  Yes [provider]  tiZANidine (ZANAFLEX) 4 MG tablet Take 4 mg by mouth every 6 (six) hours as needed for muscle spasms.   Yes [provider]  Vitamin D, Ergocalciferol, (DRISDOL) 50000 units CAPS capsule Take 50,000 Units by mouth every Friday.   Yes [provider]  metoCLOPramide (REGLAN) 10 MG tablet Take 1 tablet (10 mg total) by mouth every 6 (six) hours. 06/22/19   Alvan Culpepper A, PA-C    Allergies    No known allergies  Review of Systems   Review of Systems  Constitutional: Positive for appetite change and fatigue.  HENT: Negative.   Respiratory: Positive for cough and chest tightness (Epigastric area, lower sternum). Negative for shortness of breath, wheezing and stridor.   Gastrointestinal: Positive for abdominal pain and nausea. Negative for abdominal distention, anal bleeding, blood in stool, constipation, diarrhea, rectal pain and vomiting.       Change in bowel habits  Genitourinary: Negative.     Musculoskeletal:       Chronic back pain  Skin: Negative.   Neurological: Positive for weakness (Generalized). Negative for dizziness, tremors, syncope, speech difficulty, light-headedness, numbness and headaches.  All other systems reviewed and are negative.   Physical Exam Updated Vital Signs BP 112/81 (BP Location: Right Arm)    Pulse 60    Temp 98.5 F (36.9 C) (Oral)    Resp (!) 24    Ht 5\' 3"  (1.6 m)    Wt 108.9 kg    SpO2 100%    BMI 42.51 kg/m   Physical Exam Vitals and nursing note reviewed.  Constitutional:      General: She is not in acute distress.    Appearance: She is well-developed. She is obese. She is not ill-appearing or toxic-appearing.  HENT:     Head: Normocephalic and atraumatic.     Mouth/Throat:     Mouth: Mucous membranes are moist.  Eyes:     Pupils: Pupils are equal, round, and reactive to light.  Cardiovascular:     Rate and Rhythm: Normal rate.     Heart sounds: Normal heart sounds.  Pulmonary:     Effort: Pulmonary effort is normal. No respiratory distress.     Breath sounds: Normal breath sounds.  Abdominal:     General: Bowel sounds are normal. There is no distension.     Palpations: Abdomen is soft.     Tenderness: There is abdominal tenderness in the epigastric area. There is no right CVA tenderness, left CVA tenderness, guarding or rebound. Negative signs include Murphy's sign and McBurney's sign.     Hernia: No hernia is present.  Musculoskeletal:        General: Normal range of motion.     Cervical back: Normal range of motion.  Skin:    General: Skin is warm and dry.     Capillary Refill: Capillary refill takes less than 2 seconds.  Neurological:     General: No focal deficit present.     Mental Status: She is alert.     Comments: Ambulatory without difficulty. No ataxic gait. CN 2-12 grossly intact      ED Results / Procedures / Treatments   Labs (all labs ordered are listed, but only abnormal results are displayed) Labs  Reviewed  BASIC METABOLIC PANEL - Abnormal; Notable for the following components:      Result Value   Glucose, Bld 118 (*)    All other components within normal limits  CBC - Abnormal; Notable for the following components:   RBC 5.48 (*)    HCT 49.2 (*)    All other components within normal limits  LIPASE, BLOOD  POC SARS CORONAVIRUS 2 AG -  ED  TROPONIN I (HIGH SENSITIVITY)  TROPONIN I (HIGH SENSITIVITY)    EKG EKG Interpretation  Date/Time:  Tuesday June 22 2019 10:18:49 EDT Ventricular Rate:  71 PR Interval:  136 QRS Duration: 156 QT Interval:  422 QTC Calculation: 458 R Axis:   -61 Text Interpretation: Normal sinus rhythm Right bundle branch block Left anterior fascicular block Minimal voltage criteria for LVH, may be normal variant ( R in aVL ) Septal infarct , age undetermined No significant change since last tracing Confirmed by Blanchie Dessert [phone]) on 06/22/2019 10:36:46 AM   Radiology DG Chest 2 View  Result Date: 06/22/2019 CLINICAL DATA:  Central LEFT side chest pain, shortness of breath, nausea and fatigue for 3 weeks, dizziness in morning EXAM: CHEST - 2 VIEW COMPARISON:  11/29/2015 FINDINGS: Normal heart size, mediastinal contours, and pulmonary vascularity. Increased elevation of RIGHT diaphragm. Bibasilar atelectasis greater on RIGHT. No acute infiltrate, pleural effusion or pneumothorax. Bones demineralized with scattered degenerative disc disease changes thoracolumbar spine. IMPRESSION: Bibasilar atelectasis. Electronically Signed   By: Lavonia Dana M.D.   On: 06/22/2019 10:48   CT ABDOMEN PELVIS W CONTRAST  Result Date: 06/22/2019 CLINICAL DATA:  Nausea and vomiting EXAM: CT ABDOMEN AND PELVIS WITH CONTRAST TECHNIQUE: Multidetector CT imaging of the abdomen and pelvis was performed using the standard protocol following bolus administration of intravenous contrast. CONTRAST:  133mL OMNIPAQUE IOHEXOL 300 MG/ML  SOLN COMPARISON:  Ultrasound 06/11/2019 FINDINGS:  Lower chest: No acute abnormality. Hepatobiliary: No focal liver abnormality is seen. No gallstones, gallbladder wall thickening, or biliary dilatation. Pancreas: Unremarkable. No pancreatic ductal dilatation or surrounding inflammatory changes. Spleen: Normal in size without focal abnormality. Adrenals/Urinary Tract: Indeterminate density 1.7 cm right adrenal gland nodule and 1.5 cm left adrenal gland nodule. Bilateral renal sinus cysts, as seen on prior ultrasound. No solid renal lesion or stone. No hydronephrosis. Bilateral ureters unremarkable. Urinary bladder unremarkable. Stomach/Bowel: Stomach is within normal limits. Appendix appears normal. Scattered colonic diverticulosis. No evidence of bowel wall thickening, distention, or inflammatory changes. Vascular/Lymphatic: Mild aortoiliac atherosclerotic calcification. No aneurysm. No abdominopelvic lymphadenopathy. Reproductive: Status post hysterectomy. No adnexal masses. Other: No free fluid or free air. Tiny fat containing umbilical hernia. Musculoskeletal: L4-S1 posterior and interbody fusion. 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 There is chronic appearing fragmentation of the superior right acetabulum. IMPRESSION: 1. No acute abdominopelvic findings. 2. Diverticulosis without evidence of acute diverticulitis. 3. Indeterminate bilateral adrenal gland nodules measuring 1.7 cm on the right and 1.5 cm on the left. Consider 12 month follow-up nonemergent adrenal protocol CT or MRI. This recommendation follows ACR consensus guidelines: Management of Incidental Adrenal Masses: A White Paper of the ACR Incidental Findings Committee. J Am Coll Radiol 2017;14:1038-1044. 4. 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 Aortic Atherosclerosis (ICD10-I70.0). Electronically Signed   By: Davina Poke D.O.   On: 06/22/2019 13:23     Procedures Procedures (including critical care time)  Medications Ordered in ED Medications  sodium chloride 0.9 % bolus 1,000 mL (1,000 mLs Intravenous New Bag/Given 06/22/19 1144)  morphine 4 MG/ML injection 4 mg (4 mg Intravenous Given 06/22/19 1135)  promethazine (PHENERGAN) injection 12.5 mg (12.5 mg Intravenous Given 06/22/19 1245)  famotidine (PEPCID) IVPB 20 mg premix (0 mg Intravenous Stopped 06/22/19 1239)  iohexol (OMNIPAQUE) 300 MG/ML solution 100 mL (100 mLs Intravenous Contrast Given 06/22/19 1300)    ED Course  I have reviewed the triage vital signs and the nursing notes.  Pertinent labs & imaging results that were available during my care of the patient were reviewed by me and considered in my medical decision making (see chart for details).  74 year old female presents for evaluation of nausea.  Assessed by PCP and given Zofran.  Occult at that time which was negative, declines additional occult which I feel is reasonable given she denies melena or BRBPR.  She is afebrile, nonseptic, non-ill-appearing.  Tenderness epigastric region and inferior substernal region.  Does not radiate into left arm, back or jaw.  No associated diaphoresis.  She has been able to tolerate p.o. intake at home however with persistent nausea.  No emesis.  Passing flatulence.  Plan on labs, imaging and reassess.  Labs imaging personally reviewed interpreted CBC without leukocytosis, hemoglobin stable Metabolic panel with mild hyperglycemia to 118 however no additional electrolyte, renal or normality Lipase 17 Troponin delta flat Covid negative CT without any acute findings does have adrenal nodules which she know to follow up outpatient for her nodules DG chest with bibasilar infiltrates.  No cough, shortness of breath.  Low suspicion for PNA  Patient reassessed.  Feels improved after Phenergan.  Repeat abdominal exam benign.  She continues to deny any chest pain, shortness of breath.  Low  suspicion for atypical ACS, PE, dissection.  We will start her on Reglan and have her follow-up  outpatient.  Discussed return precautions.  Patient voiced understanding and is agreeable for follow-up.  Patient seen and evaluated by attending, Dr. Maryan Rued who agrees with above treatment, plan and disposition.   The patient has been appropriately medically screened and/or stabilized in the ED. I have low suspicion for any other emergent medical condition which would require further screening, evaluation or treatment in the ED or require inpatient management.  Patient is hemodynamically stable and in no acute distress.  Patient able to ambulate in department prior to ED.  Evaluation does not show acute pathology that would require ongoing or additional emergent interventions while in the emergency department or further inpatient treatment.  I have discussed the diagnosis with the patient and answered all questions.  Pain is been managed while in the emergency department and patient has no further complaints prior to discharge.  Patient is comfortable with plan discussed in room and is stable for discharge at this time.  I have discussed strict return precautions for returning to the emergency department.  Patient was encouraged to follow-up with PCP/specialist refer to at discharge.    MDM Rules/Calculators/A&P                       Final Clinical Impression(s) / ED Diagnoses Final diagnoses:  Epigastric pain  Nausea    Rx / DC Orders ED Discharge Orders         Ordered    metoCLOPramide (REGLAN) 10 MG tablet  Every 6 hours     06/22/19 1518           Jehan Ranganathan A, PA-C 06/22/19 1519    Blanchie Dessert, MD 06/23/19 0740

## 2019-06-22 NOTE — ED Notes (Signed)
Patient verbalizes understanding of discharge instructions. Opportunity for questioning and answers were provided. Armband removed by staff. Patient discharged from ED. Signature pad not working.

## 2019-06-22 NOTE — ED Notes (Signed)
Pt transported to CT ?

## 2019-07-05 ENCOUNTER — Encounter: Payer: Self-pay | Admitting: Neurology

## 2019-07-05 ENCOUNTER — Ambulatory Visit: Payer: Medicare Other | Admitting: Neurology

## 2019-07-05 ENCOUNTER — Other Ambulatory Visit: Payer: Self-pay

## 2019-07-05 VITALS — BP 172/83 | HR 75 | Ht 62.5 in | Wt 234.0 lb

## 2019-07-05 DIAGNOSIS — R519 Headache, unspecified: Secondary | ICD-10-CM

## 2019-07-05 DIAGNOSIS — G479 Sleep disorder, unspecified: Secondary | ICD-10-CM | POA: Diagnosis not present

## 2019-07-05 DIAGNOSIS — G2581 Restless legs syndrome: Secondary | ICD-10-CM

## 2019-07-05 DIAGNOSIS — G4719 Other hypersomnia: Secondary | ICD-10-CM

## 2019-07-05 DIAGNOSIS — R351 Nocturia: Secondary | ICD-10-CM | POA: Diagnosis not present

## 2019-07-05 DIAGNOSIS — G4700 Insomnia, unspecified: Secondary | ICD-10-CM

## 2019-07-05 NOTE — Progress Notes (Signed)
Subjective:    Patient ID: Elizabeth Barron is a 74 y.o. female.  HPI     Elizabeth Age, MD, PhD [facility] [address] [place], [place] [address]  Dear Dr. Forde Dandy,   I saw your patient, Elizabeth Barron, upon your kind request, in my Neurologic clinic today for initial consultation of her sleep disorder, in particular, concern for underlying obstructive sleep apnea.  The patient is unaccompanied today.  As you know, Elizabeth Barron is a 74 year old right-handed woman with an underlying complex medical history of arthritis, low back pain, hypothyroidism, hyperlipidemia, glaucoma, reflux disease, restless leg syndrome, and morbid obesity with a BMI of over 40, who reports chronic difficulty with sleep initiation and sleep maintenance for many years.  She had a sleep study many years ago, maybe 10 or 15 years ago which was negative for sleep apnea at the time.  Prior sleep study results are not available for my review today.  I reviewed your office note from 06/16/2019.  Her Epworth sleepiness score is 16 out of 24.  She is sleepy during the day and sleeps during the day often.  She has a bedtime typically after midnight and sometimes after 1 AM, sometimes she sleeps better in the recliner because of less leg pain.  She is followed by pain management at [facility].  Her primary care physician has taken her off of her gabapentin recently in the past 2 weeks or so because of nausea.  She takes Aleve as needed.  She has tried over-the-counter sleep aids including p.m. type medication and melatonin some years ago.  She has tried Ambien which made her groggy or drowsy during the day, she may have tried [place], she is not sure if she tried Belsomra or trazodone.  She typically gets out of bed around 6 AM.  She goes to the bathroom once in the early morning hours.  She has had occasional morning headaches and has symptoms of restless legs but often she has leg pain which comes  from the back.  Her sister has a diagnosis of sleep apnea but is not on a CPAP machine.  The patient is a non-smoker and does not drink alcohol and drinks no caffeine on a daily basis.  She is retired, she lives with her husband.  They have no pets at the house.  She has a TV on at night in her bedroom but typically turns it off when she is ready to fall asleep.   Her Past Medical History Is Significant For: Past Medical History:  Diagnosis Date  . Arthritis    knees. back  . Colon polyps   . GERD (gastroesophageal reflux disease)   . Glaucoma   . Headache   . Hypercholesteremia   . Hypothyroidism    thyroid removed  . Insomnia   . Low back pain   . Obesity   . Osteoarthritis    r hip, left hip  . Restless leg syndrome     Her Past Surgical History Is Significant For: Past Surgical History:  Procedure Laterality Date  . ANTERIOR CRUCIATE LIGAMENT REPAIR    . BREAST LUMPECTOMY     L breast- benign  . BUNIONECTOMY Left 2012  . COLONOSCOPY WITH PROPOFOL N/A 10/11/2014   Procedure: COLONOSCOPY WITH PROPOFOL;  Surgeon: Garlan Fair, MD;  Location: [facility];  Service: Endoscopy;  Laterality: N/A;  . KNEE SURGERY     bilateral scope surgery  . MAXIMUM ACCESS (  MAS)POSTERIOR LUMBAR INTERBODY FUSION (PLIF) 2 LEVEL N/A 12/06/2015   Procedure: Lumbar four-five, Lumbar five-Sacral one MAXIMUM ACCESS POSTERIOR LUMBAR INTERBODY FUSION   ;  Surgeon: Eustace Moore, MD;  Location: [facility];  Service: Neurosurgery;  Laterality: N/A;  . RADIOLOGY WITH ANESTHESIA Left 09/05/2016   Procedure: MRI OF LEFT HIP WITHOUT;  Surgeon: Radiologist, Medication, MD;  Location: [place];  Service: Radiology;  Laterality: Left;  . THYROIDECTOMY    . TONSILLECTOMY  1955   child  . TUBAL LIGATION    . VAGINAL HYSTERECTOMY  1981    Her Family History Is Significant For: Family History  Problem Relation Barron of Onset  . Cancer Mother        pancreatic  . Diabetes Mother   . Hypertension Mother   .  Cancer Father        esophageal  . Diabetes Father   . Hypertension Father   . Diabetes Sister   . Other Sister        dialysis    Her Social History Is Significant For: Social History   Socioeconomic History  . Marital status: Married    Spouse name: Not on file  . Number of children: 1  . Years of education: Not on file  . Highest education level: High school graduate  Occupational History    Employer: RETIRED  Tobacco Use  . Smoking status: Never Smoker  . Smokeless tobacco: Never Used  Substance and Sexual Activity  . Alcohol use: Yes    Comment: OCCASIONAL WINE  . Drug use: No  . Sexual activity: Not on file  Other Topics Concern  . Not on file  Social History Narrative   Lives with souse   caffeine none   Social Determinants of Health   Financial Resource Strain:   . Difficulty of Paying Living Expenses:   Food Insecurity:   . Worried About Charity fundraiser in the Last Year:   . Arboriculturist in the Last Year:   Transportation Needs:   . Film/video editor (Medical):   Marland Kitchen Lack of Transportation (Non-Medical):   Physical Activity:   . Days of Exercise per Week:   . Minutes of Exercise per Session:   Stress:   . Feeling of Stress :   Social Connections:   . Frequency of Communication with Friends and Family:   . Frequency of Social Gatherings with Friends and Family:   . Attends Religious Services:   . Active Member of Clubs or Organizations:   . Attends Archivist Meetings:   Marland Kitchen Marital Status:     Her Allergies Are:  Allergies  Allergen Reactions  . No Known Allergies   :   Her Current Medications Are:  Outpatient Encounter Medications as of 07/05/2019  Medication Sig  . acetaminophen (TYLENOL 8 HOUR ARTHRITIS PAIN) 650 MG CR tablet Take 1,300 mg by mouth daily as needed for pain.   Marland Kitchen aspirin EC 81 MG tablet Take 81 mg by mouth daily.  . brinzolamide (AZOPT) 1 % ophthalmic suspension Place 1 drop into both eyes 2 (two) times  daily.  . buprenorphine (BUTRANS) 5 MCG/HR PTWK Place 1 patch onto the skin once a week.  . Cyanocobalamin (VITAMIN B 12) 500 MCG TABS Take 2 tablets by mouth daily.  Marland Kitchen esomeprazole (NEXIUM) 40 MG capsule Take 40 mg by mouth daily.   Marland Kitchen gabapentin (NEURONTIN) 300 MG capsule Take 300 mg by mouth at bedtime.   Marland Kitchen  levothyroxine (SYNTHROID, LEVOTHROID) 175 MCG tablet Take 175 mcg by mouth See admin instructions. Take 1 tablet Monday through Friday, 1/2 tablet on Saturday, and no tablets on Sunday  . metoCLOPramide (REGLAN) 10 MG tablet Take 1 tablet (10 mg total) by mouth every 6 (six) hours.  . ondansetron (ZOFRAN) 4 MG tablet Take 4 mg by mouth 2 (two) times daily as needed for nausea or vomiting.  . rosuvastatin (CRESTOR) 5 MG tablet Take 1 tablet by mouth 2 (two) times a week. On Sunday and Wednesday  . tiZANidine (ZANAFLEX) 4 MG tablet Take 4 mg by mouth every 6 (six) hours as needed for muscle spasms.  . Vitamin D, Ergocalciferol, (DRISDOL) 50000 units CAPS capsule Take 50,000 Units by mouth every Friday.   No facility-administered encounter medications on file as of 07/05/2019.  :  Review of Systems:  Out of a complete 14 point review of systems, all are reviewed and negative with the exception of these symptoms as listed below: Review of Systems  Neurological:       Pt presents today to discuss her sleep. Pt reports that she gets only 3 hours of sleep per night. Pt does not endorse snoring. Pt has had a sleep study in the past, over 5 years ago, but does not have those records.  Epworth Sleepiness Scale 0= would never doze 1= slight chance of dozing 2= moderate chance of dozing 3= high chance of dozing  Sitting and reading: 3 Watching TV: 2 Sitting inactive in a public place (ex. Theater or meeting): 3 As a passenger in a car for an hour without a break: 3 Lying down to rest in the afternoon: 2 Sitting and talking to someone: 1 Sitting quietly after lunch (no alcohol): 2 In a car,  while stopped in traffic: 0 Total: 16     Objective:  Neurological Exam  Physical Exam Physical Examination:   Vitals:   07/05/19 0908  BP: (!) 172/83  Pulse: 75    General Examination: The patient is a very pleasant 74 y.o. female in no acute distress. She appears well-developed and well-nourished and well groomed.   HEENT: Normocephalic, atraumatic, pupils are equal, round and reactive to light, extraocular tracking is good without limitation to gaze excursion or nystagmus noted. Normal smooth pursuit is noted. Hearing is grossly intact. Face is symmetric with normal facial animation and normal facial sensation. Speech is clear with no dysarthria noted. There is no hypophonia. There is no lip, neck/head, jaw or voice tremor. Neck is supple with full range of passive and active motion. There are no carotid bruits on auscultation. Oropharynx exam reveals: mild mouth dryness, adequate dental hygiene and mild airway crowding, due to smaller airway entry, . Mallampati is class II. Tongue protrudes centrally and palate elevates symmetrically. Tonsils are absent. Neck size is 14 7/8 inches. She has no overbite.   Chest: Clear to auscultation without wheezing, rhonchi or crackles noted.  Heart: S1+S2+0, regular and normal without murmurs, rubs or gallops noted.   Abdomen: Soft, non-tender and non-distended with normal bowel sounds appreciated on auscultation.  Extremities: There is no pitting edema in the distal lower extremities bilaterally.   Skin: Warm and dry without trophic changes noted.   Musculoskeletal: exam reveals no obvious joint deformities, tenderness or joint swelling or erythema.   Neurologically:  Mental status: The patient is awake, alert and oriented in all 4 spheres. Her immediate and remote memory, attention, language skills and fund of knowledge are appropriate. There is no  evidence of aphasia, agnosia, apraxia or anomia. Speech is clear with normal prosody and  enunciation. Thought process is linear. Mood is normal and affect is normal.  Cranial nerves II - XII are as described above under HEENT exam. In addition: shoulder shrug is normal with equal shoulder height noted. Motor exam: Normal bulk, strength and tone is noted. There is no obvious tremor. Fine motor skills and coordination: grossly intact.  Cerebellar testing: No dysmetria or intention. There is no truncal or gait ataxia.  Sensory exam: intact to light touch in the upper and lower extremities.  Gait, station and balance: She stands slowly, mild increase in lumbar kyphosis, walk with a rolling walker.   Assessment and Plan:  In summary, Tristen Bermea Gladson is a very pleasant 74 y.o.-year old female with an underlying complex medical history of arthritis, low back pain, hypothyroidism, hyperlipidemia, glaucoma, reflux disease, restless leg syndrome, and morbid obesity with a BMI of over 40, who presents for evaluation of her chronic sleep difficulties.  She had a sleep study several years ago which was negative for sleep disordered breathing.  She is advised to proceed with a sleep study to further delineate the underlying sleep disturbance and to look for an organic cause of her sleep difficulties.  She may benefit from a referral to psychology for consideration of cognitive behavioral therapy for her chronic sleep initiation and sleep maintenance difficulties.  She has tried some medication over-the-counter and also some prescription sleep aid as I understand.  If her sleep study demonstrates sleep apnea, she is encouraged to try CPAP or AutoPap machine.  We talked about alternative treatment options for sleep apnea as well.  She reports that she would be reluctant to consider CPAP therapy as she is very claustrophobic.  We will pick up our discussion after testing.   I answered all her questions today and the patient was in agreement. I plan to see her back after the sleep study is completed and  encouraged her to call with any interim questions, concerns, problems or updates.   Thank you very much for allowing me to participate in the care of this nice patient. If I can be of any further assistance to you please do not hesitate to call me at [phone].  Sincerely,   Elizabeth Age, MD, PhD

## 2019-07-05 NOTE — Patient Instructions (Signed)
Based on your symptoms and your exam I believe we should look for an underlying organic sleep disorder, such as obstructive sleep apnea (OSA) with a sleep study.  If you have more than mild OSA, I want you to consider treatment with CPAP. Please remember, the risks and ramifications of moderate to severe obstructive sleep apnea or OSA are: Cardiovascular disease, including congestive heart failure, stroke, difficult to control hypertension, arrhythmias, and even type 2 diabetes has been linked to untreated OSA. Sleep apnea causes disruption of sleep and sleep deprivation in most cases, which, in turn, can cause recurrent headaches, problems with memory, mood, concentration, focus, and vigilance. Most people with untreated sleep apnea report excessive daytime sleepiness, which can affect their ability to drive. Please do not drive if you feel sleepy.   We will call you after your sleep study to advise about the results (most likely, you will hear from Cyril Mourning, my nurse).    Our sleep lab administrative assistant, will call you to schedule your sleep study. If you don't hear back from her by about 2 weeks from now, please feel free to call her at [phone]. This is her direct line and please leave a message with your phone number to call back if you get the voicemail box. She will call back as soon as possible.   Your chronic insomnia may be treated with cognitive behavioral therapy (CBT-I). Please talk to Dr. Forde Dandy about it, you may benefit from a referral to Psychology.

## 2019-07-16 ENCOUNTER — Telehealth (HOSPITAL_COMMUNITY): Payer: Self-pay | Admitting: Neurology

## 2019-07-16 NOTE — Telephone Encounter (Signed)
I returned phone call from the patient to the GN answering service.  She called to ask if it was okay to get endoscopy in the morning on Monday and then to do her sleep study on Monday night.  I told her I did not see any reason why she could not do the both if she felt all right in between.  She voiced understanding

## 2019-07-19 ENCOUNTER — Ambulatory Visit (INDEPENDENT_AMBULATORY_CARE_PROVIDER_SITE_OTHER): Payer: Medicare Other | Admitting: Neurology

## 2019-07-19 DIAGNOSIS — G4700 Insomnia, unspecified: Secondary | ICD-10-CM

## 2019-07-19 DIAGNOSIS — R519 Headache, unspecified: Secondary | ICD-10-CM

## 2019-07-19 DIAGNOSIS — G4719 Other hypersomnia: Secondary | ICD-10-CM

## 2019-07-19 DIAGNOSIS — G4733 Obstructive sleep apnea (adult) (pediatric): Secondary | ICD-10-CM | POA: Diagnosis not present

## 2019-07-19 DIAGNOSIS — G2581 Restless legs syndrome: Secondary | ICD-10-CM

## 2019-07-19 DIAGNOSIS — R351 Nocturia: Secondary | ICD-10-CM

## 2019-07-19 DIAGNOSIS — G479 Sleep disorder, unspecified: Secondary | ICD-10-CM

## 2019-07-19 DIAGNOSIS — G4720 Circadian rhythm sleep disorder, unspecified type: Secondary | ICD-10-CM

## 2019-07-29 ENCOUNTER — Telehealth: Payer: Self-pay

## 2019-07-29 NOTE — Progress Notes (Signed)
Patient referred by Dr. Forde Dandy, seen by me on 07/05/19, diagnostic PSG on 07/19/19.   Please call and notify the patient that the recent sleep study showed moderate to severe obstructive sleep apnea. I recommend treatment for this in the form of CPAP. This will require a repeat sleep study for proper titration and mask fitting and correct monitoring of the oxygen saturations. Please explain to patient. I have placed an order in the chart. Thanks.  [name], MD, PhD [facility])

## 2019-07-29 NOTE — Procedures (Signed)
PATIENT'S NAME:  Elizabeth, Barron DOB:      [birth year]      MR#:    [id number]     DATE OF RECORDING: 07/19/2019 REFERRING M.D.:  Seward Carol, MD Study Performed:   Baseline Polysomnogram HISTORY: 74 year old woman with a history of arthritis, low back pain, hypothyroidism, hyperlipidemia, glaucoma, reflux disease, restless leg syndrome, and morbid obesity with a BMI of over 40, who reports chronic difficulty with sleep initiation and sleep maintenance, and daytime somnolence. The patient endorsed the Epworth Sleepiness Scale at 16/24 points. The patient's weight 234 pounds with a height of 62 (inches), resulting in a BMI of 41.9 kg/m2. The patient's neck circumference measured 14.8 inches.  CURRENT MEDICATIONS: Tylenol, Azopt, Butrans, Vitamin B 12, Nexium, Neurontin, Synthroid, Reglan, Zofran, Crestor, Zanaflex, Drisdol.   PROCEDURE:  This is a multichannel digital polysomnogram utilizing the Somnostar 11.2 system.  Electrodes and sensors were applied and monitored per AASM Specifications.   EEG, EOG, Chin and Limb EMG, were sampled at 200 Hz.  ECG, Snore and Nasal Pressure, Thermal Airflow, Respiratory Effort, CPAP Flow and Pressure, Oximetry was sampled at 50 Hz. Digital video and audio were recorded.      BASELINE STUDY  Lights Out was at 22:00 and Lights On at 05:01.  Total recording time (TRT) was 421 minutes, with a total sleep time (TST) of 312 minutes.   The patient's sleep latency was 16 minutes.  REM latency was 62.5 minutes, which is mildly reduced. The sleep efficiency was 74.1 %.     SLEEP ARCHITECTURE: WASO (Wake after sleep onset) was 100.5 minutes with moderate to severe sleep fragmentation noted. There were 25 minutes in Stage N1, 203 minutes Stage N2, 56.5 minutes Stage N3 and 27.5 minutes in Stage REM.  The percentage of Stage N1 was 8.%, Stage N2 was 65.1%, which is increased. Stage N3 was 18.1% and Stage R (REM sleep) was 8.8%, which is reduced. The arousals were noted as:  77 were spontaneous, 4 were associated with PLMs, 10 were associated with respiratory events.  RESPIRATORY ANALYSIS:  There were a total of 89 respiratory events:  1 obstructive apneas, 1 central apneas and 0 mixed apneas with a total of 2 apneas and an apnea index (AI) of .4 /hour. There were 87 hypopneas with a hypopnea index of 16.7 /hour. The patient also had 0 respiratory event related arousals (RERAs).      The total APNEA/HYPOPNEA INDEX (AHI) was 17.1/hour and the total RESPIRATORY DISTURBANCE INDEX was  17.1 /hour.  17 events occurred in REM sleep and 143 events in NREM. The REM AHI was  37.1 /hour, versus a non-REM AHI of 15.2. The patient spent 0 minutes of total sleep time in the supine position and 312 minutes in non-supine.. The supine AHI was 0.0 versus a non-supine AHI of 17.1.  OXYGEN SATURATION & C02:  The Wake baseline 02 saturation was 94%, with the lowest being 85%; there were several errors with the O2 sensor, due to dislodged sensor. Time spent below 89% saturation equaled 50 minutes (overestimated).  PERIODIC LIMB MOVEMENTS: The patient had a total of 14 Periodic Limb Movements.  The Periodic Limb Movement (PLM) index was 2.7 and the PLM Arousal index was .8/hour.  Audio and video analysis did not show any abnormal or unusual movements, behaviors, phonations or vocalizations. She reported leg pain. The patient took no bathroom breaks. Mild snoring was noted. The EKG was in keeping with normal sinus rhythm (NSR).  Post-study, the patient  indicated that sleep was the same as usual.   IMPRESSION:  1. Obstructive Sleep Apnea (OSA) 2. Dysfunctions associated with sleep stages or arousal from sleep  RECOMMENDATIONS:  1. This study demonstrates moderate to severe obstructive sleep apnea, with a total AHI of 17.1/hour, REM AHI of 37.1/hour, and O2 nadir of 85%. The absence of supine sleep likely underestimates her sleep disordered breathing. Treatment with positive airway pressure  in the form of CPAP is recommended. This will require a full night titration study to optimize therapy. Other treatment options may include avoidance of supine sleep position along with weight loss, upper airway or jaw surgery in selected patients or the use of an oral appliance in certain patients. ENT evaluation and/or consultation with a maxillofacial surgeon or dentist may be feasible in some instances.    2. Please note that untreated obstructive sleep apnea may carry additional perioperative morbidity. Patients with significant obstructive sleep apnea should receive perioperative PAP therapy and the surgeons and particularly the anesthesiologist should be informed of the diagnosis and the severity of the sleep disordered breathing. 3. This study shows sleep fragmentation and abnormal sleep stage percentages; these are nonspecific findings and per se do not signify an intrinsic sleep disorder or a cause for the patient's sleep-related symptoms. Causes include (but are not limited to) the first night effect of the sleep study, circadian rhythm disturbances, medication effect or an underlying mood disorder or medical problem.  4. The patient should be cautioned not to drive, work at heights, or operate dangerous or heavy equipment when tired or sleepy. Review and reiteration of good sleep hygiene measures should be pursued with any patient. 5. The patient will be seen in follow-up in the sleep clinic at [facility] for discussion of the test results, symptom and treatment compliance review, further management strategies, etc. The referring provider will be notified of the test results.  I certify that I have reviewed the entire raw data recording prior to the issuance of this report in accordance with the Standards of Accreditation of the American Academy of Sleep Medicine (AASM)  [name], MD, PhD Diplomat, American Board of Neurology and Sleep Medicine (Neurology and Sleep Medicine)

## 2019-07-29 NOTE — Telephone Encounter (Signed)
-----   Message from [name], MD sent at 07/29/2019  8:15 AM EDT ----- Patient referred by Dr. Forde Dandy, seen by me on 07/05/19, diagnostic PSG on 07/19/19.   Please call and notify the patient that the recent sleep study showed moderate to severe obstructive sleep apnea. I recommend treatment for this in the form of CPAP. This will require a repeat sleep study for proper titration and mask fitting and correct monitoring of the oxygen saturations. Please explain to patient. I have placed an order in the chart. Thanks.  [name], MD, PhD [facility])

## 2019-07-29 NOTE — Addendum Note (Signed)
Addended by: [name] on: 07/29/2019 08:15 AM   Modules accepted: Orders

## 2019-07-29 NOTE — Telephone Encounter (Signed)
I called pt. I advised pt that Dr. Rexene Alberts reviewed their sleep study results and found that Moderate to severe osa as found and recommends that pt be treated with a cpap. Dr. Rexene Alberts recommends that pt return for a repeat sleep study in order to properly titrate the cpap and ensure a good mask fit. Pt is agreeable to returning for a titration study. I advised pt that our sleep lab will file with pt's insurance and call pt to schedule the sleep study when we hear back from the pt's insurance regarding coverage of this sleep study. Pt verbalized understanding of results. Pt had no questions at this time but was encouraged to call back if questions arise.

## 2019-08-16 ENCOUNTER — Other Ambulatory Visit (HOSPITAL_COMMUNITY): Payer: Self-pay | Admitting: Physician Assistant

## 2019-08-16 ENCOUNTER — Other Ambulatory Visit: Payer: Self-pay | Admitting: Physician Assistant

## 2019-08-16 DIAGNOSIS — R110 Nausea: Secondary | ICD-10-CM

## 2019-08-17 ENCOUNTER — Ambulatory Visit (INDEPENDENT_AMBULATORY_CARE_PROVIDER_SITE_OTHER): Payer: Medicare Other | Admitting: Neurology

## 2019-08-17 DIAGNOSIS — G2581 Restless legs syndrome: Secondary | ICD-10-CM

## 2019-08-17 DIAGNOSIS — G4720 Circadian rhythm sleep disorder, unspecified type: Secondary | ICD-10-CM

## 2019-08-17 DIAGNOSIS — G4719 Other hypersomnia: Secondary | ICD-10-CM

## 2019-08-17 DIAGNOSIS — G4700 Insomnia, unspecified: Secondary | ICD-10-CM

## 2019-08-17 DIAGNOSIS — R519 Headache, unspecified: Secondary | ICD-10-CM

## 2019-08-17 DIAGNOSIS — G4733 Obstructive sleep apnea (adult) (pediatric): Secondary | ICD-10-CM

## 2019-08-17 DIAGNOSIS — R351 Nocturia: Secondary | ICD-10-CM

## 2019-08-27 ENCOUNTER — Other Ambulatory Visit (HOSPITAL_COMMUNITY): Payer: Self-pay | Admitting: Physician Assistant

## 2019-08-27 DIAGNOSIS — R110 Nausea: Secondary | ICD-10-CM

## 2019-08-30 ENCOUNTER — Ambulatory Visit (HOSPITAL_COMMUNITY)
Admission: RE | Admit: 2019-08-30 | Discharge: 2019-08-30 | Disposition: A | Discharge status: Home / Self Care | Payer: Medicare Other | Source: Ambulatory Visit | Attending: Physician Assistant | Admitting: Physician Assistant

## 2019-08-30 ENCOUNTER — Other Ambulatory Visit: Payer: Self-pay

## 2019-08-30 ENCOUNTER — Telehealth: Payer: Self-pay | Admitting: Physician Assistant

## 2019-08-30 DIAGNOSIS — R110 Nausea: Secondary | ICD-10-CM | POA: Diagnosis not present

## 2019-08-30 MED ORDER — TECHNETIUM TC 99M MEBROFENIN IV KIT
5.4000 | PACK | Freq: Once | INTRAVENOUS | Status: AC | PRN
  Administered 2019-08-30: 5.4 via INTRAVENOUS

## 2019-08-30 NOTE — Telephone Encounter (Signed)
08/30/19~LVM on Hm/mobile VM. MF °

## 2019-09-08 ENCOUNTER — Other Ambulatory Visit: Payer: Self-pay

## 2019-09-08 ENCOUNTER — Ambulatory Visit (HOSPITAL_COMMUNITY)
Admission: RE | Admit: 2019-09-08 | Discharge: 2019-09-08 | Disposition: A | Discharge status: Home / Self Care | Payer: Medicare Other | Source: Ambulatory Visit | Attending: Physician Assistant | Admitting: Physician Assistant

## 2019-09-08 DIAGNOSIS — R110 Nausea: Secondary | ICD-10-CM | POA: Insufficient documentation

## 2019-09-08 MED ORDER — TECHNETIUM TC 99M SULFUR COLLOID
2.0000 | Freq: Once | INTRAVENOUS | Status: AC | PRN
  Administered 2019-09-08: 2 via INTRAVENOUS

## 2019-09-09 NOTE — Progress Notes (Signed)
Patient referred by Dr. Forde Dandy, seen by me on 07/05/19, diagnostic PSG on 07/19/19.  Patient had a CPAP titration study on 08/17/19.  Please call and inform patient that I have entered an order for treatment with positive airway pressure (PAP) treatment for obstructive sleep apnea (OSA). She did well during the latest sleep study with CPAP. We will, therefore, arrange for a machine for home use through a DME (durable medical equipment) company of Her choice; and I will see the patient back in follow-up in about 10 weeks. Please also explain to the patient that I will be looking out for compliance data, which can be downloaded from the machine (stored on an SD card, that is inserted in the machine) or via remote access through a modem, that is built into the machine. At the time of the followup appointment we will discuss sleep study results and how it is going with PAP treatment at home. Please advise patient to bring Her machine at the time of the first FU visit, even though this is cumbersome. Bringing the machine for every visit after that will likely not be needed, but often helps for the first visit to troubleshoot if needed. Please re-enforce the importance of compliance with treatment and the need for [place] to monitor compliance data - often an insurance requirement and actually good feedback for the patient as far as how they are doing.  Also remind patient, that any interim PAP machine or mask issues should be first addressed with the DME company, as they can often help better with technical and mask fit issues. Please ask if patient has a preference regarding DME company.  Please also make sure, the patient has a follow-up appointment with me in about 10 weeks from the setup date, thanks. May see one of our nurse practitioners if needed for proper timing of the FU appointment.  Please fax or rout report to the referring provider. Thanks,   [name], MD, PhD [facility])

## 2019-09-09 NOTE — Procedures (Signed)
PATIENT'S NAME:  Elizabeth Barron, Elizabeth Barron DOB:      1945-04-12      MR#:    [id number]     DATE OF RECORDING: 08/17/2019 REFERRING M.D.:  Seward Carol, MD Study Performed:   CPAP  Titration HISTORY:  74 year old woman with a history of arthritis, low back pain, hypothyroidism, hyperlipidemia, glaucoma, reflux disease, restless leg syndrome, and morbid obesity with a BMI of over 40, who returns for  a CPAP titration study. Her baseline PSG performed on 07/19/19 showed an AHI of 17.1 and low spo2 of 85%. The patient endorsed the Epworth Sleepiness Scale at 16 points. The patient's weight 234 pounds with a height of 62 (inches), resulting in a BMI of 43. kg/m2. The patient's neck circumference measured 14 inches.  CURRENT MEDICATIONS: Tylenol, Azopt, Butrans, Vitamin B 12, Nexium, Neurontin, Synthroid, Reglan, Zofran, Crestor, Zanaflex, Drisdol.  PROCEDURE:  This is a multichannel digital polysomnogram utilizing the SomnoStar 11.2 system.  Electrodes and sensors were applied and monitored per AASM Specifications.   EEG, EOG, Chin and Limb EMG, were sampled at 200 Hz.  ECG, Snore and Nasal Pressure, Thermal Airflow, Respiratory Effort, CPAP Flow and Pressure, Oximetry was sampled at 50 Hz. Digital video and audio were recorded.      The patient was fitted with a small F30i FFM, after trying a nasal interface first. CPAP was initiated at 5 cmH20 with heated humidity per AASM standards and pressure was advanced to 10cmH20 because of hypopneas, apneas and desaturations.  At a PAP pressure of 10 cmH20, there was a reduction of the AHI to 0/hour with non-supine REM sleep achieved and O2 nadir of 89%.   Lights Out was at 22:28 and Lights On at 04:59. Total recording time (TRT) was 391.5 minutes, with a total sleep time (TST) of 277.5 minutes. The patient's sleep latency was 43 minutes, which is delayed. REM latency was 56 minutes. The sleep efficiency was 70.9%, which is reduced.     SLEEP ARCHITECTURE: WASO (Wake  after sleep onset) was 78 minutes with moderate sleep fragmentation noted. There were 26 minutes in Stage N1, 87.5 minutes Stage N2, 123 minutes Stage N3 and 41 minutes in Stage REM. The percentage of Stage N1 was 9.4%, which is increased, Stage N2 was 31.5%, Stage N3 was 44.3% and Stage R (REM sleep) was 14.8%, which is reduced. The arousals were noted as: 33 were spontaneous, 12 were associated with PLMs, 1 were associated with respiratory events.  RESPIRATORY ANALYSIS:  There was a total of 3 respiratory events: 0 obstructive apneas, 0 central apneas and 0 mixed apneas with a total of 0 apneas and an apnea index (AI) of 0 /hour. There were 3 hypopneas with a hypopnea index of .6/hour. The patient also had 0 respiratory event related arousals (RERAs).      The total APNEA/HYPOPNEA INDEX (AHI) was .6/hour and the total RESPIRATORY DISTURBANCE INDEX was .6 /hour  1 events occurred in REM sleep and 2 events in NREM. The REM AHI was 1.5/hour versus a non-REM AHI of .5 /hour. The patient spent 123.5 minutes of total sleep time in the supine position and 154 minutes in non-supine. The supine AHI was 0.0, versus a non-supine AHI of 1.2.   OXYGEN SATURATION & C02:  The baseline 02 saturation was 94%, with the lowest being 88%. Time spent below 89% saturation equaled 0 minutes.  PERIODIC LIMB MOVEMENTS:  The patient had a total of 65 Periodic Limb Movements. The Periodic Limb Movement (PLM) index  was 14.1 and the PLM Arousal index was 2.6 /hour.   Audio and video analysis did not show any abnormal or unusual movements, behaviors, phonations or vocalizations. The patient took no bathroom breaks. The EKG was in keeping with normal sinus rhythm (NSR).  Post-study, the patient indicated that sleep was better than same as usual.   IMPRESSION:   1. Obstructive Sleep Apnea (OSA) 2. Dysfunctions associated with sleep stages or arousal from sleep   RECOMMENDATIONS:   1. This study demonstrates resolution of the  patient's obstructive sleep apnea with CPAP therapy. I will, therefore, start the patient on home CPAP treatment at a pressure of 10 cm via small FFM with (heated) humidity. The patient should be reminded to be fully compliant with PAP therapy to improve sleep related symptoms and decrease long term cardiovascular risks. The patient should be reminded, that it may take up to 3 months to get fully used to using PAP with all planned sleep. The earlier full compliance is achieved, the better long term compliance tends to be. Please note that untreated obstructive sleep apnea may carry additional perioperative morbidity. Patients with significant obstructive sleep apnea should receive perioperative PAP therapy and the surgeons and particularly the anesthesiologist should be informed of the diagnosis and the severity of the sleep disordered breathing. 2. This study shows sleep fragmentation and abnormal sleep stage percentages; these are nonspecific findings and per se do not signify an intrinsic sleep disorder or a cause for the patient's sleep-related symptoms. Causes include (but are not limited to) the first night effect of the sleep study, circadian rhythm disturbances, medication effect or an underlying mood disorder or medical problem.  3. The patient should be cautioned not to drive, work at heights, or operate dangerous or heavy equipment when tired or sleepy. Review and reiteration of good sleep hygiene measures should be pursued with any patient. 4. The patient will be seen in follow-up in the sleep clinic at [facility] for discussion of the test results, symptom and treatment compliance review, further management strategies, etc. The referring provider will be notified of the test results.   I certify that I have reviewed the entire raw data recording prior to the issuance of this report in accordance with the Standards of Accreditation of the American Academy of Sleep Medicine (AASM)   [name], MD,  PhD Diplomat, American Board of Neurology and Sleep Medicine (Neurology and Sleep Medicine)

## 2019-09-09 NOTE — Addendum Note (Signed)
Addended by: [name] on: 09/09/2019 12:41 PM   Modules accepted: Orders

## 2019-09-14 ENCOUNTER — Telehealth: Payer: Self-pay

## 2019-09-14 NOTE — Telephone Encounter (Signed)
-----   Message from [name], MD sent at 09/09/2019 12:41 PM EDT ----- Patient referred by Dr. Forde Dandy, seen by me on 07/05/19, diagnostic PSG on 07/19/19.  Patient had a CPAP titration study on 08/17/19.  Please call and inform patient that I have entered an order for treatment with positive airway pressure (PAP) treatment for obstructive sleep apnea (OSA). She did well during the latest sleep study with CPAP. We will, therefore, arrange for a machine for home use through a DME (durable medical equipment) company of Her choice; and I will see the patient back in follow-up in about 10 weeks. Please also explain to the patient that I will be looking out for compliance data, which can be downloaded from the machine (stored on an SD card, that is inserted in the machine) or via remote access through a modem, that is built into the machine. At the time of the followup appointment we will discuss sleep study results and how it is going with PAP treatment at home. Please advise patient to bring Her machine at the time of the first FU visit, even though this is cumbersome. Bringing the machine for every visit after that will likely not be needed, but often helps for the first visit to troubleshoot if needed. Please re-enforce the importance of compliance with treatment and the need for [place] to monitor compliance data - often an insurance requirement and actually good feedback for the patient as far as how they are doing.  Also remind patient, that any interim PAP machine or mask issues should be first addressed with the DME company, as they can often help better with technical and mask fit issues. Please ask if patient has a preference regarding DME company.  Please also make sure, the patient has a follow-up appointment with me in about 10 weeks from the setup date, thanks. May see one of our nurse practitioners if needed for proper timing of the FU appointment.  Please fax or rout report to the referring provider.  Thanks,   [name], MD, PhD [facility] [employer])

## 2019-09-14 NOTE — Telephone Encounter (Signed)
I called pt. No answer, left a message asking pt to call me back.   

## 2019-09-15 NOTE — Telephone Encounter (Signed)
Pt has called Megan,RN back please call on mobile

## 2019-09-16 NOTE — Telephone Encounter (Addendum)
I attempted to reach the pt. Vm was full and not accepting new messages.

## 2019-09-20 NOTE — Telephone Encounter (Signed)
I called pt. I advised pt that Dr. Rexene Alberts reviewed their sleep study results and found that pt did well during her cpap titration on cpap. Dr. Rexene Alberts recommends that pt start cpap treatment at home. I reviewed PAP compliance expectations with the pt. Pt is agreeable to starting a CPAP. I advised pt that an order will be sent to a DME, [facility], and [facility] will call the pt within about one week after they file with the pt's insurance. [facility] will show the pt how to use the machine, fit for masks, and troubleshoot the CPAP if needed. A follow up appt was made for insurance purposes with Dr. Rexene Alberts on 11/24/2019 at 300 pm. Pt verbalized understanding to arrive 15 minutes early and bring their CPAP. A letter with all of this information in it will be mailed to the pt as a reminder. I verified with the pt that the address we have on file is correct. Pt verbalized understanding of results. Pt had no questions at this time but was encouraged to call back if questions arise. I have sent the order to [facility] and have received confirmation that they have received the order.

## 2019-11-24 ENCOUNTER — Ambulatory Visit: Payer: Self-pay | Admitting: Neurology

## 2019-12-14 NOTE — Progress Notes (Addendum)
PATIENT: Elizabeth Barron DOB: 1946/01/27  REASON FOR VISIT: follow up HISTORY FROM: patient  Chief Complaint  Patient presents with  . Follow-up    rm 9  . Sleep Apnea    Pt says she is not sleeping more that 2 hrs at a time.     HISTORY OF PRESENT ILLNESS: Today 12/15/19 Elizabeth Barron is a 74 y.o. female here today for follow up for recently diagnosed OSA started on CPAP. Sleep study in 06/2019 showed "moderate to severe obstructive sleep apnea, with a total AHI of 17.1/hour, REM AHI of 37.1/hour, and O2 nadir of 85%". CPAP titration showed adequate management of OSA at set pressure of 10cmH20. She reports that she places CPAP therapy every night. She continues to have a very difficult time getting to and staying asleep. She does feel that there has been some benefit to using CPAP. She feels she may sleep 3-4 hours each night. Sometimes only 2 hours. She does feel that sleep quality has improved since using CPAP. She wakes feeling more refreshed. She has struggled with insomnia for years. She has tried multiple sleep aids that only made her feel sluggish.   Compliance report dated 11/14/2019 3920 2021 reveals that she used CPAP 29 of the past 30 days for compliance of 97%.  She used CPAP greater than 4 hours 29 of the past 30 days for compliance of 97%.  Average usage was 5 hours and 31 minutes.  Residual AHI was 1.0 on 10 cm of water and an EPR of 3.  There is no significant leak noted.  HISTORY: (copied from Dr Guadelupe Sabin note on 07/05/2019)  Dear Dr. Forde Dandy,   I saw your patient, Elizabeth Barron, upon your kind request, in my Neurologic clinic today for initial consultation of her sleep disorder, in particular, concern for underlying obstructive sleep apnea.  The patient is unaccompanied today.  As you know, Elizabeth Barron is a 74 year old right-handed woman with an underlying complex medical history of arthritis, low back pain, hypothyroidism, hyperlipidemia, glaucoma, reflux disease,  restless leg syndrome, and morbid obesity with a BMI of over 40, who reports chronic difficulty with sleep initiation and sleep maintenance for many years.  She had a sleep study many years ago, maybe 10 or 15 years ago which was negative for sleep apnea at the time.  Prior sleep study results are not available for my review today.  I reviewed your office note from 06/16/2019.  Her Epworth sleepiness score is 16 out of 24.  She is sleepy during the day and sleeps during the day often.  She has a bedtime typically after midnight and sometimes after 1 AM, sometimes she sleeps better in the recliner because of less leg pain.  She is followed by pain management at [facility].  Her primary care physician has taken her off of her gabapentin recently in the past 2 weeks or so because of nausea.  She takes Aleve as needed.  She has tried over-the-counter sleep aids including p.m. type medication and melatonin some years ago.  She has tried Ambien which made her groggy or drowsy during the day, she may have tried [place], she is not sure if she tried Belsomra or trazodone.  She typically gets out of bed around 6 AM.  She goes to the bathroom once in the early morning hours.  She has had occasional morning headaches and has symptoms of restless legs but often she has leg pain which comes from the back.  Her sister has a diagnosis of sleep apnea but is not on a CPAP machine.  The patient is a non-smoker and does not drink alcohol and drinks no caffeine on a daily basis.  She is retired, she lives with her husband.  They have no pets at the house.  She has a TV on at night in her bedroom but typically turns it off when she is ready to fall asleep.   REVIEW OF SYSTEMS: Out of a complete 14 system review of symptoms, the patient complains only of the following symptoms, insomnia and all other reviewed systems are negative.  ALLERGIES: Allergies  Allergen Reactions  . No Known Allergies     HOME MEDICATIONS: Outpatient  Medications Prior to Visit  Medication Sig Dispense Refill  . acetaminophen (TYLENOL 8 HOUR ARTHRITIS PAIN) 650 MG CR tablet Take 1,300 mg by mouth daily as needed for pain.     Marland Kitchen aspirin EC 81 MG tablet Take 81 mg by mouth daily.    . brinzolamide (AZOPT) 1 % ophthalmic suspension Place 1 drop into both eyes 2 (two) times daily.    . Cyanocobalamin (VITAMIN B 12) 500 MCG TABS Take 2 tablets by mouth daily.    Marland Kitchen esomeprazole (NEXIUM) 40 MG capsule Take 40 mg by mouth daily.   3  . gabapentin (NEURONTIN) 300 MG capsule Take 300 mg by mouth at bedtime.     Marland Kitchen levothyroxine (SYNTHROID, LEVOTHROID) 175 MCG tablet Take 175 mcg by mouth See admin instructions. Take 1 tablet Monday through Friday, 1/2 tablet on Saturday, and no tablets on Sunday    . ondansetron (ZOFRAN) 4 MG tablet Take 4 mg by mouth 2 (two) times daily as needed for nausea or vomiting.    . rosuvastatin (CRESTOR) 5 MG tablet Take 1 tablet by mouth 2 (two) times a week. On Sunday and Wednesday    . tiZANidine (ZANAFLEX) 4 MG tablet Take 4 mg by mouth every 6 (six) hours as needed for muscle spasms.    . Vitamin D, Ergocalciferol, (DRISDOL) 50000 units CAPS capsule Take 50,000 Units by mouth every Friday.    . buprenorphine (BUTRANS) 5 MCG/HR PTWK Place 1 patch onto the skin once a week.    . metoCLOPramide (REGLAN) 10 MG tablet Take 1 tablet (10 mg total) by mouth every 6 (six) hours. 30 tablet 0   No facility-administered medications prior to visit.    PAST MEDICAL HISTORY: Past Medical History:  Diagnosis Date  . Arthritis    knees. back  . Colon polyps   . GERD (gastroesophageal reflux disease)   . Glaucoma   . Headache   . Hypercholesteremia   . Hypothyroidism    thyroid removed  . Insomnia   . Low back pain   . Obesity   . Osteoarthritis    r hip, left hip  . Restless leg syndrome     PAST SURGICAL HISTORY: Past Surgical History:  Procedure Laterality Date  . ANTERIOR CRUCIATE LIGAMENT REPAIR    . BREAST  LUMPECTOMY     L breast- benign  . BUNIONECTOMY Left 2012  . COLONOSCOPY WITH PROPOFOL N/A 10/11/2014   Procedure: COLONOSCOPY WITH PROPOFOL;  Surgeon: Garlan Fair, MD;  Location: [facility] ENDOSCOPY;  Service: Endoscopy;  Laterality: N/A;  . KNEE SURGERY     bilateral scope surgery  . MAXIMUM ACCESS (MAS)POSTERIOR LUMBAR INTERBODY FUSION (PLIF) 2 LEVEL N/A 12/06/2015   Procedure: Lumbar four-five, Lumbar five-Sacral one MAXIMUM ACCESS POSTERIOR LUMBAR INTERBODY FUSION   ;  Surgeon: Eustace Moore, MD;  Location: [facility] NEURO ORS;  Service: Neurosurgery;  Laterality: N/A;  . RADIOLOGY WITH ANESTHESIA Left 09/05/2016   Procedure: MRI OF LEFT HIP WITHOUT;  Surgeon: Radiologist, Medication, MD;  Location: [facility];  Service: Radiology;  Laterality: Left;  . THYROIDECTOMY    . TONSILLECTOMY  1955   child  . TUBAL LIGATION    . VAGINAL HYSTERECTOMY  1981    FAMILY HISTORY: Family History  Problem Relation Age of Onset  . Cancer Mother        pancreatic  . Diabetes Mother   . Hypertension Mother   . Cancer Father        esophageal  . Diabetes Father   . Hypertension Father   . Diabetes Sister   . Other Sister        dialysis    SOCIAL HISTORY: Social History   Socioeconomic History  . Marital status: Married    Spouse name: Not on file  . Number of children: 1  . Years of education: Not on file  . Highest education level: High school graduate  Occupational History    Employer: RETIRED  Tobacco Use  . Smoking status: Never Smoker  . Smokeless tobacco: Never Used  Vaping Use  . Vaping Use: Never used  Substance and Sexual Activity  . Alcohol use: Yes    Comment: OCCASIONAL WINE  . Drug use: No  . Sexual activity: Not on file  Other Topics Concern  . Not on file  Social History Narrative   Lives with souse   caffeine none   Social Determinants of Health   Financial Resource Strain:   . Difficulty of Paying Living Expenses: Not on file  Food Insecurity:   . Worried About  Charity fundraiser in the Last Year: Not on file  . Ran Out of Food in the Last Year: Not on file  Transportation Needs:   . Lack of Transportation (Medical): Not on file  . Lack of Transportation (Non-Medical): Not on file  Physical Activity:   . Days of Exercise per Week: Not on file  . Minutes of Exercise per Session: Not on file  Stress:   . Feeling of Stress : Not on file  Social Connections:   . Frequency of Communication with Friends and Family: Not on file  . Frequency of Social Gatherings with Friends and Family: Not on file  . Attends Religious Services: Not on file  . Active Member of Clubs or Organizations: Not on file  . Attends Archivist Meetings: Not on file  . Marital Status: Not on file  Intimate Partner Violence:   . Fear of Current or Ex-Partner: Not on file  . Emotionally Abused: Not on file  . Physically Abused: Not on file  . Sexually Abused: Not on file      PHYSICAL EXAM  Vitals:   12/15/19 1422  BP: 131/81  Pulse: 81  Weight: 229 lb (103.9 kg)  Height: 5\' 2"  (1.575 m)   Body mass index is 41.88 kg/m.  Generalized: Well developed, in no acute distress  Cardiology: normal rate and rhythm, no murmur noted Respiratory: clear to auscultation bilaterally  Neurological examination  Mentation: Alert oriented to time, place, history taking. Follows all commands speech and language fluent Cranial nerve II-XII: Pupils were equal round reactive to light. Extraocular movements were full, visual field were full  Motor: The motor testing reveals 5 over 5 strength of all 4 extremities. Good  symmetric motor tone is noted throughout.  Gait and station: Gait is normal.    DIAGNOSTIC DATA (LABS, IMAGING, TESTING) - I reviewed patient records, labs, notes, testing and imaging myself where available.  No flowsheet data found.   Lab Results  Component Value Date   WBC 7.5 06/22/2019   HGB 15.0 06/22/2019   HCT 49.2 (H) 06/22/2019   MCV 89.8  06/22/2019   PLT 238 06/22/2019      Component Value Date/Time   NA 141 06/22/2019 1033   K 4.3 06/22/2019 1033   CL 105 06/22/2019 1033   CO2 28 06/22/2019 1033   GLUCOSE 118 (H) 06/22/2019 1033   BUN 8 06/22/2019 1033   CREATININE 0.82 06/22/2019 1033   CALCIUM 9.6 06/22/2019 1033   GFRNONAA >60 06/22/2019 1033   GFRAA >60 06/22/2019 1033   No results found for: CHOL, HDL, LDLCALC, LDLDIRECT, TRIG, CHOLHDL No results found for: HGBA1C No results found for: VITAMINB12 No results found for: TSH     ASSESSMENT AND PLAN 74 y.o. year old female  has a past medical history of Arthritis, Colon polyps, GERD (gastroesophageal reflux disease), Glaucoma, Headache, Hypercholesteremia, Hypothyroidism, Insomnia, Low back pain, Obesity, Osteoarthritis, and Restless leg syndrome. here with     ICD-10-CM   1. OSA on CPAP  G47.33 For home use only DME continuous positive airway pressure (CPAP)   Z99.89      Elizabeth Barron is doing well on CPAP therapy. No concerns with therapy. She does continue to have difficulty getting to sleep and staying asleep. We have discussed a retrial of melatonin 5-10 mg about an hour before bedtime with CPAP therapy.  Compliance report reveals excellent compliance.  She was encouraged to continue using CPAP nightly and for greater than 4 hours each night.  She is requesting that we contact DME as she has not been able to get updated supplies. She has been waiting on replacement mask for about a month. I have asked her to call me with contact information of who she is working with. She has active DME orders. No documentation of any requests to [place] for any additional information as states by patient.  I will resend supply order today to see if this helps. She will continue to follow-up with primary care.  Healthy lifestyle habits encouraged.  She will follow-up with [place] in 6 months, sooner if needed.  She verbalizes understanding and agreement with this plan.   Orders Placed This  Encounter  Procedures  . For home use only DME continuous positive airway pressure (CPAP)    Supplies    Order Specific Question:   Length of Need    Answer:   Lifetime    Order Specific Question:   Patient has OSA or probable OSA    Answer:   Yes    Order Specific Question:   Is the patient currently using CPAP in the home    Answer:   Yes    Order Specific Question:   Settings    Answer:   Other see comments    Order Specific Question:   CPAP supplies needed    Answer:   Mask, headgear, cushions, filters, heated tubing and water chamber     No orders of the defined types were placed in this encounter.     I spent 25 minutes with the patient. 50% of this time was spent counseling and educating patient on plan of care and medications.    Debbora Presto, FNP-C 12/15/2019, 2:59 PM  [facility] [address] [place], [place] [address] [phone]  I reviewed the above note and documentation by the Nurse Practitioner and agree with the history, exam, assessment and plan as outlined above. I was available for consultation. [name], MD, PhD [facility])

## 2019-12-14 NOTE — Patient Instructions (Addendum)
Please continue using your CPAP regularly. While your insurance requires that you use CPAP at least 4 hours each night on 70% of the nights, I recommend, that you not skip any nights and use it throughout the night if you can. Getting used to CPAP and staying with the treatment long term does take time and patience and discipline. Untreated obstructive sleep apnea when it is moderate to severe can have an adverse impact on cardiovascular health and raise her risk for heart disease, arrhythmias, hypertension, congestive heart failure, stroke and diabetes. Untreated obstructive sleep apnea causes sleep disruption, nonrestorative sleep, and sleep deprivation. This can have an impact on your day to day functioning and cause daytime sleepiness and impairment of cognitive function, memory loss, mood disturbance, and problems focussing. Using CPAP regularly can improve these symptoms.   I have resent DME supply orders. Please call me with the contact information of who you have been talking to DME. Consider using melatonin 5mg  every night about an hour before bedtime.   Follow up in 6 months.    Sleep Apnea Sleep apnea affects breathing during sleep. It causes breathing to stop for a short time or to become shallow. It can also increase the risk of:  Heart attack.  Stroke.  Being very overweight (obese).  Diabetes.  Heart failure.  Irregular heartbeat. The goal of treatment is to help you breathe normally again. What are the causes? There are three kinds of sleep apnea:  Obstructive sleep apnea. This is caused by a blocked or collapsed airway.  Central sleep apnea. This happens when the brain does not send the right signals to the muscles that control breathing.  Mixed sleep apnea. This is a combination of obstructive and central sleep apnea. The most common cause of this condition is a collapsed or blocked airway. This can happen if:  Your throat muscles are too relaxed.  Your tongue  and tonsils are too large.  You are overweight.  Your airway is too small. What increases the risk?  Being overweight.  Smoking.  Having a small airway.  Being older.  Being female.  Drinking alcohol.  Taking medicines to calm yourself (sedatives or tranquilizers).  Having family members with the condition. What are the signs or symptoms?  Trouble staying asleep.  Being sleepy or tired during the day.  Getting angry a lot.  Loud snoring.  Headaches in the morning.  Not being able to focus your mind (concentrate).  Forgetting things.  Less interest in sex.  Mood swings.  Personality changes.  Feelings of sadness (depression).  Waking up a lot during the night to pee (urinate).  Dry mouth.  Sore throat. How is this diagnosed?  Your medical history.  A physical exam.  A test that is done when you are sleeping (sleep study). The test is most often done in a sleep lab but may also be done at home. How is this treated?   Sleeping on your side.  Using a medicine to get rid of mucus in your nose (decongestant).  Avoiding the use of alcohol, medicines to help you relax, or certain pain medicines (narcotics).  Losing weight, if needed.  Changing your diet.  Not smoking.  Using a machine to open your airway while you sleep, such as: ? An oral appliance. This is a mouthpiece that shifts your lower jaw forward. ? A CPAP device. This device blows air through a mask when you breathe out (exhale). ? An EPAP device. This has valves that  you put in each nostril. ? A BPAP device. This device blows air through a mask when you breathe in (inhale) and breathe out.  Having surgery if other treatments do not work. It is important to get treatment for sleep apnea. Without treatment, it can lead to:  High blood pressure.  Coronary artery disease.  In men, not being able to have an erection (impotence).  Reduced thinking ability. Follow these instructions  at home: Lifestyle  Make changes that your doctor recommends.  Eat a healthy diet.  Lose weight if needed.  Avoid alcohol, medicines to help you relax, and some pain medicines.  Do not use any products that contain nicotine or tobacco, such as cigarettes, e-cigarettes, and chewing tobacco. If you need help quitting, ask your doctor. General instructions  Take over-the-counter and prescription medicines only as told by your doctor.  If you were given a machine to use while you sleep, use it only as told by your doctor.  If you are having surgery, make sure to tell your doctor you have sleep apnea. You may need to bring your device with you.  Keep all follow-up visits as told by your doctor. This is important. Contact a doctor if:  The machine that you were given to use during sleep bothers you or does not seem to be working.  You do not get better.  You get worse. Get help right away if:  Your chest hurts.  You have trouble breathing in enough air.  You have an uncomfortable feeling in your back, arms, or stomach.  You have trouble talking.  One side of your body feels weak.  A part of your face is hanging down. These symptoms may be an emergency. Do not wait to see if the symptoms will go away. Get medical help right away. Call your local emergency services (911 in the [place] Do not drive yourself to the hospital. Summary  This condition affects breathing during sleep.  The most common cause is a collapsed or blocked airway.  The goal of treatment is to help you breathe normally while you sleep. This information is not intended to replace advice given to you by your health care provider. Make sure you discuss any questions you have with your health care provider. Document Revised: 12/26/2017 Document Reviewed: 11/04/2017 Elsevier Patient Education  [place].   CPAP and BPAP Information CPAP and BPAP are methods of helping a person breathe with the use  of air pressure. CPAP stands for "continuous positive airway pressure." BPAP stands for "bi-level positive airway pressure." In both methods, air is blown through your nose or mouth and into your air passages to help you breathe well. CPAP and BPAP use different amounts of pressure to blow air. With CPAP, the amount of pressure stays the same while you breathe in and out. With BPAP, the amount of pressure is increased when you breathe in (inhale) so that you can take larger breaths. Your health care provider will recommend whether CPAP or BPAP would be more helpful for you. Why are CPAP and BPAP treatments used? CPAP or BPAP can be helpful if you have:  Sleep apnea.  Chronic obstructive pulmonary disease (COPD).  Heart failure.  Medical conditions that weaken the muscles of the chest including muscular dystrophy, or neurological diseases such as amyotrophic lateral sclerosis (ALS).  Other problems that cause breathing to be weak, abnormal, or difficult. CPAP is most commonly used for obstructive sleep apnea (OSA) to keep the airways from  collapsing when the muscles relax during sleep. How is CPAP or BPAP administered? Both CPAP and BPAP are provided by a small machine with a flexible plastic tube that attaches to a plastic mask. You wear the mask. Air is blown through the mask into your nose or mouth. The amount of pressure that is used to blow the air can be adjusted on the machine. Your health care provider will determine the pressure setting that should be used based on your individual needs. When should CPAP or BPAP be used? In most cases, the mask only needs to be worn during sleep. Generally, the mask needs to be worn throughout the night and during any daytime naps. People with certain medical conditions may also need to wear the mask at other times when they are awake. Follow instructions from your health care provider about when to use the machine. What are some tips for using the  mask?   Because the mask needs to be snug, some people feel trapped or closed-in (claustrophobic) when first using the mask. If you feel this way, you may need to get used to the mask. One way to do this is by holding the mask loosely over your nose or mouth and then gradually applying the mask more snugly. You can also gradually increase the amount of time that you use the mask.  Masks are available in various types and sizes. Some fit over your mouth and nose while others fit over just your nose. If your mask does not fit well, talk with your health care provider about getting a different one.  If you are using a mask that fits over your nose and you tend to breathe through your mouth, a chin strap may be applied to help keep your mouth closed.  The CPAP and BPAP machines have alarms that may sound if the mask comes off or develops a leak.  If you have trouble with the mask, it is very important that you talk with your health care provider about finding a way to make the mask easier to tolerate. Do not stop using the mask. Stopping the use of the mask could have a negative impact on your health. What are some tips for using the machine?  Place your CPAP or BPAP machine on a secure table or stand near an electrical outlet.  Know where the on/off switch is located on the machine.  Follow instructions from your health care provider about how to set the pressure on your machine and when you should use it.  Do not eat or drink while the CPAP or BPAP machine is on. Food or fluids could get pushed into your lungs by the pressure of the CPAP or BPAP.  Do not smoke. Tobacco smoke residue can damage the machine.  For home use, CPAP and BPAP machines can be rented or purchased through home health care companies. Many different brands of machines are available. Renting a machine before purchasing may help you find out which particular machine works well for you.  Keep the CPAP or BPAP machine and  attachments clean. Ask your health care provider for specific instructions. Get help right away if:  You have redness or open areas around your nose or mouth where the mask fits.  You have trouble using the CPAP or BPAP machine.  You cannot tolerate wearing the CPAP or BPAP mask.  You have pain, discomfort, and bloating in your abdomen. Summary  CPAP and BPAP are methods of helping a person  breathe with the use of air pressure.  Both CPAP and BPAP are provided by a small machine with a flexible plastic tube that attaches to a plastic mask.  If you have trouble with the mask, it is very important that you talk with your health care provider about finding a way to make the mask easier to tolerate. This information is not intended to replace advice given to you by your health care provider. Make sure you discuss any questions you have with your health care provider. Document Revised: 07/01/2018 Document Reviewed: 01/29/2016 Elsevier Patient Education  [place].

## 2019-12-15 ENCOUNTER — Ambulatory Visit: Payer: Medicare Other | Admitting: Family Medicine

## 2019-12-15 ENCOUNTER — Other Ambulatory Visit: Payer: Self-pay

## 2019-12-15 ENCOUNTER — Ambulatory Visit: Payer: Self-pay | Admitting: Neurology

## 2019-12-15 ENCOUNTER — Encounter: Payer: Self-pay | Admitting: Family Medicine

## 2019-12-15 VITALS — BP 131/81 | HR 81 | Ht 62.0 in | Wt 229.0 lb

## 2019-12-15 DIAGNOSIS — G4733 Obstructive sleep apnea (adult) (pediatric): Secondary | ICD-10-CM | POA: Diagnosis not present

## 2019-12-15 DIAGNOSIS — Z9989 Dependence on other enabling machines and devices: Secondary | ICD-10-CM | POA: Diagnosis not present

## 2019-12-15 NOTE — Progress Notes (Signed)
Order for cpap supplies sent to [facility] via community message. Confirmation received that the order transmitted was successful.  

## 2020-01-05 ENCOUNTER — Other Ambulatory Visit: Payer: Self-pay

## 2020-01-05 ENCOUNTER — Encounter: Payer: Self-pay | Admitting: Cardiology

## 2020-01-05 ENCOUNTER — Ambulatory Visit: Payer: Medicare Other | Admitting: Cardiology

## 2020-01-05 VITALS — BP 130/82 | HR 82 | Temp 98.0°F | Resp 10 | Ht 62.0 in | Wt 229.0 lb

## 2020-01-05 DIAGNOSIS — I517 Cardiomegaly: Secondary | ICD-10-CM

## 2020-01-05 DIAGNOSIS — E7800 Pure hypercholesterolemia, unspecified: Secondary | ICD-10-CM

## 2020-01-05 DIAGNOSIS — R5383 Other fatigue: Secondary | ICD-10-CM

## 2020-01-05 DIAGNOSIS — I452 Bifascicular block: Secondary | ICD-10-CM

## 2020-01-05 DIAGNOSIS — E039 Hypothyroidism, unspecified: Secondary | ICD-10-CM

## 2020-01-05 DIAGNOSIS — I700 Atherosclerosis of aorta: Secondary | ICD-10-CM

## 2020-01-05 DIAGNOSIS — I4510 Unspecified right bundle-branch block: Secondary | ICD-10-CM

## 2020-01-05 NOTE — Progress Notes (Signed)
Date:  01/05/2020   ID:  Elizabeth Barron, Elizabeth Barron 1946-03-16, MRN [id number]  PCP:  Seward Carol, MD  Cardiologist:  Rex Kras, DO, [facility] (established care 01/05/2020)  REASON FOR CONSULT: Bifascicular block, LVH, and fatigue.   REQUESTING PHYSICIAN:  Seward Carol, MD [address] [place],  [place] [address]  Chief Complaint  Patient presents with  . bifasicular block  . left ventricular hypertophy  . New Patient (Initial Visit)    HPI  Elizabeth Barron is a 74 y.o. female who presents to the office with a chief complaint of " abnormal EKG." Patient's past medical history and cardiovascular risk factors include: Right bundle branch block, left anterior fascicular block, hyperlipidemia, hypothyroidism, aortic atherosclerosis (06/22/2019), bilateral adrenal gland nodules (06/22/2019), postmenopausal female, advanced age.  She is referred to the office at the request of Seward Carol, MD for evaluation of bifascicular block, LVH, fatigue.  Patient states that recently she has undergone multiple GI studies entering one of the studies and EKG was performed and interpreted as abnormal.  She had further discussion with her PCP and the shared decision was to refer to cardiology for further evaluation and management.  Patient denies any chest pain or shortness of breath at rest or with activities.  She is overall euvolemic and no prior history of congestive heart failure.  She is more tired and fatigued than her baseline.  Denies prior history of coronary artery disease, myocardial infarction, congestive heart failure, deep venous thrombosis, pulmonary embolism, stroke, transient ischemic attack.  FUNCTIONAL STATUS: No structured exercise program or daily routine. Limited factors are back and legs.    ALLERGIES: No Known Allergies  MEDICATION LIST PRIOR TO VISIT: Current Meds  Medication Sig  . acetaminophen (TYLENOL 8 HOUR ARTHRITIS PAIN) 650 MG CR tablet Take  1,300 mg by mouth daily as needed for pain.   Marland Kitchen aspirin EC 81 MG tablet Take 81 mg by mouth daily.  . brinzolamide (AZOPT) 1 % ophthalmic suspension Place 1 drop into both eyes 2 (two) times daily.  . Cyanocobalamin (VITAMIN B 12) 500 MCG TABS Take 2 tablets by mouth daily.  . diclofenac (VOLTAREN) 75 MG EC tablet Take 75 mg by mouth 2 (two) times daily.  Marland Kitchen esomeprazole (NEXIUM) 40 MG capsule Take 40 mg by mouth daily.   Marland Kitchen levothyroxine (SYNTHROID, LEVOTHROID) 175 MCG tablet Take 175 mcg by mouth See admin instructions. Take 1 tablet Monday through Friday, 1/2 tablet on Saturday, and no tablets on Sunday  . rosuvastatin (CRESTOR) 5 MG tablet Take 1 tablet by mouth 2 (two) times a week. On Sunday and Wednesday  . Vitamin D, Ergocalciferol, (DRISDOL) 50000 units CAPS capsule Take 50,000 Units by mouth every Friday.     PAST MEDICAL HISTORY: Past Medical History:  Diagnosis Date  . Arthritis    knees. back  . Colon polyps   . GERD (gastroesophageal reflux disease)   . Glaucoma   . Headache   . Hypercholesteremia   . Hypothyroidism    thyroid removed  . Insomnia   . Low back pain   . Obesity   . Osteoarthritis    r hip, left hip  . Restless leg syndrome     PAST SURGICAL HISTORY: Past Surgical History:  Procedure Laterality Date  . ANTERIOR CRUCIATE LIGAMENT REPAIR    . BREAST LUMPECTOMY     L breast- benign  . BUNIONECTOMY Left 2012  . COLONOSCOPY WITH PROPOFOL N/A 10/11/2014   Procedure: COLONOSCOPY WITH  PROPOFOL;  Surgeon: Garlan Fair, MD;  Location: [facility];  Service: Endoscopy;  Laterality: N/A;  . KNEE SURGERY     bilateral scope surgery  . MAXIMUM ACCESS (MAS)POSTERIOR LUMBAR INTERBODY FUSION (PLIF) 2 LEVEL N/A 12/06/2015   Procedure: Lumbar four-five, Lumbar five-Sacral one MAXIMUM ACCESS POSTERIOR LUMBAR INTERBODY FUSION   ;  Surgeon: Eustace Moore, MD;  Location: [facility];  Service: Neurosurgery;  Laterality: N/A;  . RADIOLOGY WITH ANESTHESIA Left  09/05/2016   Procedure: MRI OF LEFT HIP WITHOUT;  Surgeon: Radiologist, Medication, MD;  Location: [facility];  Service: Radiology;  Laterality: Left;  . THYROIDECTOMY    . TONSILLECTOMY  1955   child  . TUBAL LIGATION    . VAGINAL HYSTERECTOMY  1981    FAMILY HISTORY: The patient family history includes Cancer in her father and mother; Diabetes in her father, mother, and sister; Hypertension in her father and mother; Other in her sister.  SOCIAL HISTORY:  The patient  reports that she has never smoked. She has never used smokeless tobacco. She reports current alcohol use. She reports that she does not use drugs.  REVIEW OF SYSTEMS: Review of Systems  Constitutional: Positive for malaise/fatigue. Negative for chills and fever.  HENT: Negative for hoarse voice and nosebleeds.   Eyes: Negative for discharge, double vision and pain.  Cardiovascular: Negative for chest pain, claudication, dyspnea on exertion, leg swelling, near-syncope, orthopnea, palpitations, paroxysmal nocturnal dyspnea and syncope.  Respiratory: Negative for hemoptysis and shortness of breath.   Musculoskeletal: Positive for back pain. Negative for muscle cramps and myalgias.  Gastrointestinal: Negative for abdominal pain, constipation, diarrhea, hematemesis, hematochezia, melena, nausea and vomiting.  Neurological: Negative for dizziness and light-headedness.    PHYSICAL EXAM: Vitals with BMI 01/05/2020 12/15/2019 07/05/2019  Height _0  _1  5' 2.5"  Weight 229 lbs 229 lbs 234 lbs  BMI 41.87 56.25 63.89  Systolic 373 428 768  Diastolic 82 81 83  Pulse 82 81 75   CONSTITUTIONAL: Well-developed and well-nourished. No acute distress.  SKIN: Skin is warm and dry. No rash noted. No cyanosis. No pallor. No jaundice HEAD: Normocephalic and atraumatic.  EYES: No scleral icterus MOUTH/THROAT: Moist oral membranes.  NECK: No JVD present. No thyromegaly noted. No carotid bruits  LYMPHATIC: No visible cervical adenopathy.    CHEST Normal respiratory effort. No intercostal retractions  LUNGS: Clear to auscultation bilaterally. No stridor. No wheezes. No rales.  CARDIOVASCULAR: Regular rate and rhythm, positive S1-S2, no murmurs rubs or gallops appreciated. ABDOMINAL: No apparent ascites.  EXTREMITIES: No peripheral edema  HEMATOLOGIC: No significant bruising NEUROLOGIC: Oriented to person, place, and time. Nonfocal. Normal muscle tone.  PSYCHIATRIC: Normal mood and affect. Normal behavior. Cooperative  CARDIAC DATABASE: EKG: 01/05/2020: Sinus  Rhythm, 73bpm, left axis deviation, right bundle branch block, left anterior fascicular block, left ventricular hypertrophy, poor R wave progression, without explaining injury pattern.  Echocardiogram: No results found for this or any previous visit from the past 1095 days.   Stress Testing: No results found for this or any previous visit from the past 1095 days.  Heart Catheterization: None  LABORATORY DATA: CBC Latest Ref Rng & Units 06/22/2019 09/05/2016 11/29/2015  WBC 4.0 - 10.5 K/uL 7.5 6.5 7.4  Hemoglobin 12.0 - 15.0 g/dL 15.0 14.9 14.9  Hematocrit 36 - 46 % 49.2(H) 45.5 47.1(H)  Platelets 150 - 400 K/uL 238 246 253    CMP Latest Ref Rng & Units 06/22/2019 09/05/2016 11/29/2015  Glucose 70 - 99  mg/dL 118(H) 103(H) 99  BUN 8 - 23 mg/dL _0 Creatinine 0.44 - 1.00 mg/dL 0.82 0.72 0.75  Sodium 135 - 145 mmol/L 141 138 140  Potassium 3.5 - 5.1 mmol/L 4.3 3.9 3.9  Chloride 98 - 111 mmol/L 105 106 106  CO2 22 - 32 mmol/L _1 Calcium 8.9 - 10.3 mg/dL 9.6 9.6 9.8    Lipid Panel  No results found for: CHOL, TRIG, HDL, CHOLHDL, VLDL, LDLCALC, LDLDIRECT, LABVLDL  No components found for: NTPROBNP No results for input(s): PROBNP in the last 8760 hours. No results for input(s): TSH in the last 8760 hours.  BMP Recent Labs    06/22/19 1033  NA 141  K 4.3  CL 105  CO2 28  GLUCOSE 118*  BUN 8  CREATININE 0.82  CALCIUM 9.6  GFRNONAA >60  GFRAA  >60    HEMOGLOBIN A1C No results found for: HGBA1C, MPG   External Labs: Collected: 12/16/2018 Lipid profile: Total cholesterol 175, triglycerides 64, HDL 56, LDL 106, non HDL 119  Collected: 05/19/2018 Creatinine: 0.8 mg/dL. eGFR: 70 mL/min per 1.73 m  IMPRESSION:    ICD-10-CM   1. Bifascicular block  I45.2 PCV MYOCARDIAL PERFUSION WITH LEXISCAN    LONG TERM MONITOR (3-14 DAYS)  2. LVH (left ventricular hypertrophy)  I51.7 EKG 12-Lead  3. RBBB  I45.10 PCV ECHOCARDIOGRAM COMPLETE    PCV MYOCARDIAL PERFUSION WITH LEXISCAN    LONG TERM MONITOR (3-14 DAYS)  4. Other fatigue  R53.83 LONG TERM MONITOR (3-14 DAYS)  5. Hypercholesteremia  E78.00   6. Hypothyroidism, unspecified type  E03.9   7. Aortic atherosclerosis (HCC)  I70.0      RECOMMENDATIONS: Elizabeth Barron is a 74 y.o. female whose past medical history and cardiac risk factors include: Right bundle branch block, left anterior fascicular block, hyperlipidemia, hypothyroidism, aortic atherosclerosis (06/22/2019), bilateral adrenal gland nodules (06/22/2019), postmenopausal female, advanced age.  Had long discussion with the patient in regards to the EKG findings.  She has underlying normal sinus rhythm with right bundle branch block, left anterior fascicular block.  This has been present on prior ECGs but no cardiovascular testing was performed in the recent past.  Given the patient has been experiencing more tired and fatigue recommend a 7day extended Holter monitor to evaluate for any dysrhythmias, pauses, or AV blocks.  Echocardiogram will be ordered to evaluate for structural heart disease and left ventricular systolic function.  Nuclear stress test recommended to evaluate for reversible ischemia.  Benign essential hypertension: Office blood pressure well controlled.  Currently managed by primary care provider.  Hypercholesterolemia: Most recent lipid profile reviewed.  Currently on statin therapy.  Does not endorse any  myalgias.  Currently managed by primary care provider.  FINAL MEDICATION LIST END OF ENCOUNTER: No orders of the defined types were placed in this encounter.     Current Outpatient Medications:  .  acetaminophen (TYLENOL 8 HOUR ARTHRITIS PAIN) 650 MG CR tablet, Take 1,300 mg by mouth daily as needed for pain. , Disp: , Rfl:  .  aspirin EC 81 MG tablet, Take 81 mg by mouth daily., Disp: , Rfl:  .  brinzolamide (AZOPT) 1 % ophthalmic suspension, Place 1 drop into both eyes 2 (two) times daily., Disp: , Rfl:  .  Cyanocobalamin (VITAMIN B 12) 500 MCG TABS, Take 2 tablets by mouth daily., Disp: , Rfl:  .  diclofenac (VOLTAREN) 75 MG EC tablet, Take 75 mg by mouth 2 (two) times daily.,  Disp: , Rfl:  .  esomeprazole (NEXIUM) 40 MG capsule, Take 40 mg by mouth daily. , Disp: , Rfl: 3 .  levothyroxine (SYNTHROID, LEVOTHROID) 175 MCG tablet, Take 175 mcg by mouth See admin instructions. Take 1 tablet Monday through Friday, 1/2 tablet on Saturday, and no tablets on Sunday, Disp: , Rfl:  .  rosuvastatin (CRESTOR) 5 MG tablet, Take 1 tablet by mouth 2 (two) times a week. On Sunday and Wednesday, Disp: , Rfl:  .  Vitamin D, Ergocalciferol, (DRISDOL) 50000 units CAPS capsule, Take 50,000 Units by mouth every Friday., Disp: , Rfl:   Orders Placed This Encounter  Procedures  . PCV MYOCARDIAL PERFUSION WITH LEXISCAN  . LONG TERM MONITOR (3-14 DAYS)  . EKG 12-Lead  . PCV ECHOCARDIOGRAM COMPLETE    There are no Patient Instructions on file for this visit.   --Continue cardiac medications as reconciled in final medication list. --Return in about 4 weeks (around 02/02/2020) for Follow up bifascicular block, Review test results. Or sooner if needed. --Continue follow-up with your primary care physician regarding the management of your other chronic comorbid conditions.  Patient's questions and concerns were addressed to her satisfaction. She voices understanding of the instructions provided during this  encounter.   This note was created using a voice recognition software as a result there may be grammatical errors inadvertently enclosed that do not reflect the nature of this encounter. Every attempt is made to correct such errors.  Rex Kras, [place], [facility]  Pager: [phone] Office: [phone]

## 2020-01-13 ENCOUNTER — Other Ambulatory Visit: Payer: Self-pay

## 2020-01-13 ENCOUNTER — Ambulatory Visit: Payer: Medicare Other

## 2020-01-14 ENCOUNTER — Ambulatory Visit: Payer: Medicare Other

## 2020-01-14 DIAGNOSIS — I4510 Unspecified right bundle-branch block: Secondary | ICD-10-CM

## 2020-01-17 ENCOUNTER — Other Ambulatory Visit: Payer: Self-pay

## 2020-01-17 ENCOUNTER — Ambulatory Visit: Payer: Medicare Other

## 2020-01-17 DIAGNOSIS — I4510 Unspecified right bundle-branch block: Secondary | ICD-10-CM

## 2020-01-17 DIAGNOSIS — I452 Bifascicular block: Secondary | ICD-10-CM

## 2020-01-17 NOTE — Progress Notes (Signed)
Spoke with patient. Patient voiced understanding.

## 2020-01-31 NOTE — Progress Notes (Signed)
Called pt to inform her to keep her appt. Pt understood

## 2020-02-03 ENCOUNTER — Encounter: Payer: Self-pay | Admitting: Cardiology

## 2020-02-03 ENCOUNTER — Ambulatory Visit: Payer: Medicare Other | Admitting: Cardiology

## 2020-02-03 ENCOUNTER — Other Ambulatory Visit: Payer: Self-pay

## 2020-02-03 VITALS — BP 131/74 | HR 74 | Ht 62.0 in | Wt 234.0 lb

## 2020-02-03 DIAGNOSIS — I517 Cardiomegaly: Secondary | ICD-10-CM

## 2020-02-03 DIAGNOSIS — E7800 Pure hypercholesterolemia, unspecified: Secondary | ICD-10-CM

## 2020-02-03 DIAGNOSIS — I700 Atherosclerosis of aorta: Secondary | ICD-10-CM

## 2020-02-03 DIAGNOSIS — Z712 Person consulting for explanation of examination or test findings: Secondary | ICD-10-CM

## 2020-02-03 DIAGNOSIS — I452 Bifascicular block: Secondary | ICD-10-CM

## 2020-02-03 DIAGNOSIS — I4510 Unspecified right bundle-branch block: Secondary | ICD-10-CM

## 2020-02-03 NOTE — Progress Notes (Signed)
Date:  02/03/2020   ID:  Elizabeth Barron, Elizabeth Barron 1945/04/06, MRN [id number]  PCP:  Seward Carol, MD  Cardiologist:  Rex Kras, DO, [facility] (established care 01/05/2020)  Chief Complaint  Patient presents with  . Bifascicular block    test results  . Follow-up    HPI  Elizabeth Barron is a 74 y.o. female who presents to the office with a chief complaint of " review test results." Patient's past medical history and cardiovascular risk factors include: Right bundle branch block, left anterior fascicular block, hyperlipidemia, hypothyroidism, aortic atherosclerosis (06/22/2019), bilateral adrenal gland nodules (06/22/2019), postmenopausal female, advanced age.  She is referred to the office at the request of her endocrinologist for evaluation of bifascicular block, LVH, fatigue.  In the recent past she has undergone multiple GI studies entering one of the studies and EKG was performed and interpreted as abnormal.  During last office visit I informed the patient that she had underlying right bundle branch block and left anterior fascicular blocks on prior EKGs and this is not a new finding.  However given her multiple cardiovascular risk factors has not undergone a cardiac evaluation and the shared decision was to undergo echo, stress test and monitor.  Since last office visit patient denies any chest pain or shortness of breath at rest or with activities.  No recent hospitalizations or urgent care visits.  Reviewed the results of the echo, stress test and monitor with the patient and her daughter Elizabeth Barron over the phone during today's encounter.  The findings are noted below for further reference.  Denies prior history of coronary artery disease, myocardial infarction, congestive heart failure, deep venous thrombosis, pulmonary embolism, stroke, transient ischemic attack.  FUNCTIONAL STATUS: No structured exercise program or daily routine. Limited factors are back and legs.     ALLERGIES: No Known Allergies  MEDICATION LIST PRIOR TO VISIT: Current Meds  Medication Sig  . acetaminophen (TYLENOL 8 HOUR ARTHRITIS PAIN) 650 MG CR tablet Take 1,300 mg by mouth daily as needed for pain.   Marland Kitchen aspirin EC 81 MG tablet Take 81 mg by mouth daily.  . brinzolamide (AZOPT) 1 % ophthalmic suspension Place 1 drop into both eyes 2 (two) times daily.  . Cyanocobalamin (VITAMIN B 12) 500 MCG TABS Take 2 tablets by mouth daily.  . diclofenac (VOLTAREN) 75 MG EC tablet Take 75 mg by mouth 2 (two) times daily.  Marland Kitchen esomeprazole (NEXIUM) 40 MG capsule Take 40 mg by mouth daily.   Marland Kitchen levothyroxine (SYNTHROID, LEVOTHROID) 175 MCG tablet Take 175 mcg by mouth See admin instructions. Take 1 tablet Monday through Friday, 1/2 tablet on Saturday, and no tablets on Sunday  . rosuvastatin (CRESTOR) 5 MG tablet Take 1 tablet by mouth 2 (two) times a week. On Sunday and Wednesday  . Vitamin D, Ergocalciferol, (DRISDOL) 50000 units CAPS capsule Take 50,000 Units by mouth every Friday.     PAST MEDICAL HISTORY: Past Medical History:  Diagnosis Date  . Arthritis    knees. back  . Colon polyps   . GERD (gastroesophageal reflux disease)   . Glaucoma   . Headache   . Hypercholesteremia   . Hypothyroidism    thyroid removed  . Insomnia   . Low back pain   . Obesity   . Osteoarthritis    r hip, left hip  . Restless leg syndrome     PAST SURGICAL HISTORY: Past Surgical History:  Procedure Laterality Date  . ANTERIOR CRUCIATE LIGAMENT REPAIR    .  BREAST LUMPECTOMY     L breast- benign  . BUNIONECTOMY Left 2012  . COLONOSCOPY WITH PROPOFOL N/A 10/11/2014   Procedure: COLONOSCOPY WITH PROPOFOL;  Surgeon: Garlan Fair, MD;  Location: [facility];  Service: Endoscopy;  Laterality: N/A;  . KNEE SURGERY     bilateral scope surgery  . MAXIMUM ACCESS (MAS)POSTERIOR LUMBAR INTERBODY FUSION (PLIF) 2 LEVEL N/A 12/06/2015   Procedure: Lumbar four-five, Lumbar five-Sacral one MAXIMUM ACCESS  POSTERIOR LUMBAR INTERBODY FUSION   ;  Surgeon: Eustace Moore, MD;  Location: [facility];  Service: Neurosurgery;  Laterality: N/A;  . RADIOLOGY WITH ANESTHESIA Left 09/05/2016   Procedure: MRI OF LEFT HIP WITHOUT;  Surgeon: Radiologist, Medication, MD;  Location: [facility];  Service: Radiology;  Laterality: Left;  . THYROIDECTOMY    . TONSILLECTOMY  1955   child  . TUBAL LIGATION    . VAGINAL HYSTERECTOMY  1981    FAMILY HISTORY: The patient family history includes Cancer in her father and mother; Diabetes in her father, mother, and sister; Hypertension in her father and mother; Other in her sister.  SOCIAL HISTORY:  The patient  reports that she has never smoked. She has never used smokeless tobacco. She reports current alcohol use. She reports that she does not use drugs.  REVIEW OF SYSTEMS: Review of Systems  Constitutional: Positive for malaise/fatigue. Negative for chills and fever.  HENT: Negative for hoarse voice and nosebleeds.   Eyes: Negative for discharge, double vision and pain.  Cardiovascular: Negative for chest pain, claudication, dyspnea on exertion, leg swelling, near-syncope, orthopnea, palpitations, paroxysmal nocturnal dyspnea and syncope.  Respiratory: Negative for hemoptysis and shortness of breath.   Musculoskeletal: Positive for back pain. Negative for muscle cramps and myalgias.  Gastrointestinal: Negative for abdominal pain, constipation, diarrhea, hematemesis, hematochezia, melena, nausea and vomiting.  Neurological: Negative for dizziness and light-headedness.   PHYSICAL EXAM: Vitals with BMI 02/03/2020 01/05/2020 12/15/2019  Height 5' 2"  5' 2"  5' 2"   Weight 234 lbs 229 lbs 229 lbs  BMI 42.79 36.14 43.15  Systolic 400 867 619  Diastolic 74 82 81  Pulse 74 82 81   CONSTITUTIONAL: Well-developed and well-nourished. No acute distress.  SKIN: Skin is warm and dry. No rash noted. No cyanosis. No pallor. No jaundice HEAD: Normocephalic and atraumatic.  EYES:  No scleral icterus MOUTH/THROAT: Moist oral membranes.  NECK: No JVD present. No thyromegaly noted. No carotid bruits  LYMPHATIC: No visible cervical adenopathy.  CHEST Normal respiratory effort. No intercostal retractions  LUNGS: Clear to auscultation bilaterally. No stridor. No wheezes. No rales.  CARDIOVASCULAR: Regular rate and rhythm, positive S1-S2, no murmurs rubs or gallops appreciated. ABDOMINAL: No apparent ascites.  EXTREMITIES: No peripheral edema  HEMATOLOGIC: No significant bruising NEUROLOGIC: Oriented to person, place, and time. Nonfocal. Normal muscle tone.  PSYCHIATRIC: Normal mood and affect. Normal behavior. Cooperative  CARDIAC DATABASE: EKG: 01/05/2020: Sinus  Rhythm, 73bpm, left axis deviation, right bundle branch block, left anterior fascicular block, left ventricular hypertrophy, poor R wave progression, without explaining injury pattern.  Echocardiogram: 01/14/2020: Normal LV systolic function with visual EF 60-65%. Left ventricle cavity is normal in size. Mild left ventricular hypertrophy.  Normal global wall motion. Normal diastolic filling pattern, normal LAP.  Trace aortic regurgitation. Mild tricuspid regurgitation. No evidence of pulmonary hypertension. No prior study for comparison.   Stress Testing: Lexiscan Tetrofosmin Stress Test  01/17/2020: Nondiagnostic ECG stress. Mild breast attenuation in the anterior wall noted.   Normal myocardial perfusion. Gated SPECT imaging  of the left ventricle was normal. All segments of left ventricle demonstrated normal wall motion and thickening. Stress LV EF is normal 59%.  No previous exam available for comparison. Low risk.   Heart Catheterization: None  7 day extended Holter monitor: Dominant rhythm normal sinus rhythm. Heart rate 53-210 bpm.  Avg HR 83 bpm. No atrial fibrillation, high grade AV block, pauses (3 seconds or longer). Total ventricular ectopic burden <1%.  Longest episode of ventricular  bigeminy was 3.9 seconds.  1 episode of NSVT that was 54 beats in duration and at HR ranging between (128-158bpm).  Total supraventricular ectopic burden <1%.   7 episodes of SVT with average HR of 154 bpm and the longest episode was 6 beats in duration at a rate of 210 bpm. Patient triggered events: 0.    LABORATORY DATA: CBC Latest Ref Rng & Units 06/22/2019 09/05/2016 11/29/2015  WBC 4.0 - 10.5 K/uL 7.5 6.5 7.4  Hemoglobin 12.0 - 15.0 g/dL 15.0 14.9 14.9  Hematocrit 36 - 46 % 49.2(H) 45.5 47.1(H)  Platelets 150 - 400 K/uL 238 246 253    CMP Latest Ref Rng & Units 06/22/2019 09/05/2016 11/29/2015  Glucose 70 - 99 mg/dL 118(H) 103(H) 99  BUN 8 - 23 mg/dL 8 15 14   Creatinine 0.44 - 1.00 mg/dL 0.82 0.72 0.75  Sodium 135 - 145 mmol/L 141 138 140  Potassium 3.5 - 5.1 mmol/L 4.3 3.9 3.9  Chloride 98 - 111 mmol/L 105 106 106  CO2 22 - 32 mmol/L 28 24 27   Calcium 8.9 - 10.3 mg/dL 9.6 9.6 9.8    Lipid Panel  No results found for: CHOL, TRIG, HDL, CHOLHDL, VLDL, LDLCALC, LDLDIRECT, LABVLDL  No components found for: NTPROBNP No results for input(s): PROBNP in the last 8760 hours. No results for input(s): TSH in the last 8760 hours.  BMP Recent Labs    06/22/19 1033  NA 141  K 4.3  CL 105  CO2 28  GLUCOSE 118*  BUN 8  CREATININE 0.82  CALCIUM 9.6  GFRNONAA >60  GFRAA >60    HEMOGLOBIN A1C No results found for: HGBA1C, MPG   External Labs: Collected: 12/16/2018 Lipid profile: Total cholesterol 175, triglycerides 64, HDL 56, LDL 106, non HDL 119  Collected: 05/19/2018 Creatinine: 0.8 mg/dL. eGFR: 70 mL/min per 1.73 m  IMPRESSION:    ICD-10-CM   1. Bifascicular block  I45.2   2. RBBB  I45.10   3. LVH (left ventricular hypertrophy)  I51.7   4. Encounter to discuss test results  Z71.2   5. Hypercholesteremia  E78.00   6. Aortic atherosclerosis (HCC)  I70.0      RECOMMENDATIONS: Tressie Ragin Mittleman is a 74 y.o. female whose past medical history and cardiac risk factors  include: Right bundle branch block, left anterior fascicular block, hyperlipidemia, hypothyroidism, aortic atherosclerosis (06/22/2019), bilateral adrenal gland nodules (06/22/2019), postmenopausal female, advanced age.  Bifascicular block (RBBB plus left anterior fascicular block):  Patient symptoms include generalized tired and fatigued.    She was recommended to undergo an extended Holter monitor to evaluate for heart blocks, pauses, or dysrhythmias.    7-day Holter monitor noted underlying rhythm to be sinus without underlying dysrhythmias, pauses, or AV blocks.  The patient did have 1 episode of NSVT of 4 beats in duration and several episodes of PSVT.  We discussed starting AV nodal blocking agents but she chooses not to be on pharmacological therapy at this time as there asymptomatic events, which is acceptable.  Of note, her one episode of NSVT 4 beats was while she was sleeping and she does have underlying sleep apnea which is being treated.  And her ischemic work-up notes preserved LVEF and normal myocardial perfusion.  We will continue to monitor.  Benign essential hypertension:  Office blood pressure well controlled.  Currently managed by primary care provider.  Hypercholesterolemia:  Most recent lipid profile reviewed.  Currently on statin therapy.  Does not endorse any myalgias.  Currently managed by primary care provider.  Aortic atherosclerosis: Continue aspirin and statin therapy.  Total time spent: 31 minutes discussing disease management, reviewing test results including rhythm strips for the Holter monitor, and discussing the case and recommendations with her daughter Elizabeth Barron over the phone.  I would like to see her back in 1 year for follow-up or sooner if needed.  FINAL MEDICATION LIST END OF ENCOUNTER: No orders of the defined types were placed in this encounter.     Current Outpatient Medications:  .  acetaminophen (TYLENOL 8 HOUR ARTHRITIS PAIN) 650 MG CR  tablet, Take 1,300 mg by mouth daily as needed for pain. , Disp: , Rfl:  .  aspirin EC 81 MG tablet, Take 81 mg by mouth daily., Disp: , Rfl:  .  brinzolamide (AZOPT) 1 % ophthalmic suspension, Place 1 drop into both eyes 2 (two) times daily., Disp: , Rfl:  .  Cyanocobalamin (VITAMIN B 12) 500 MCG TABS, Take 2 tablets by mouth daily., Disp: , Rfl:  .  diclofenac (VOLTAREN) 75 MG EC tablet, Take 75 mg by mouth 2 (two) times daily., Disp: , Rfl:  .  esomeprazole (NEXIUM) 40 MG capsule, Take 40 mg by mouth daily. , Disp: , Rfl: 3 .  levothyroxine (SYNTHROID, LEVOTHROID) 175 MCG tablet, Take 175 mcg by mouth See admin instructions. Take 1 tablet Monday through Friday, 1/2 tablet on Saturday, and no tablets on Sunday, Disp: , Rfl:  .  rosuvastatin (CRESTOR) 5 MG tablet, Take 1 tablet by mouth 2 (two) times a week. On Sunday and Wednesday, Disp: , Rfl:  .  Vitamin D, Ergocalciferol, (DRISDOL) 50000 units CAPS capsule, Take 50,000 Units by mouth every Friday., Disp: , Rfl:   No orders of the defined types were placed in this encounter.  There are no Patient Instructions on file for this visit.   --Continue cardiac medications as reconciled in final medication list. --Return in about 1 year (around 02/02/2021) for 1 year follow up PSVT. . Or sooner if needed. --Continue follow-up with your primary care physician regarding the management of your other chronic comorbid conditions.  Patient's questions and concerns were addressed to her satisfaction. She voices understanding of the instructions provided during this encounter.   This note was created using a voice recognition software as a result there may be grammatical errors inadvertently enclosed that do not reflect the nature of this encounter. Every attempt is made to correct such errors.  Rex Kras, [place], [facility]  Pager: [phone] Office: [phone]

## 2020-02-07 ENCOUNTER — Ambulatory Visit: Payer: Medicare Other | Admitting: Cardiology

## 2020-03-28 ENCOUNTER — Other Ambulatory Visit: Payer: Self-pay | Admitting: Internal Medicine

## 2020-03-28 DIAGNOSIS — Z1231 Encounter for screening mammogram for malignant neoplasm of breast: Secondary | ICD-10-CM

## 2020-04-04 DIAGNOSIS — G4733 Obstructive sleep apnea (adult) (pediatric): Secondary | ICD-10-CM | POA: Diagnosis not present

## 2020-04-10 DIAGNOSIS — E7800 Pure hypercholesterolemia, unspecified: Secondary | ICD-10-CM | POA: Diagnosis not present

## 2020-04-10 DIAGNOSIS — G4700 Insomnia, unspecified: Secondary | ICD-10-CM | POA: Diagnosis not present

## 2020-04-10 DIAGNOSIS — K219 Gastro-esophageal reflux disease without esophagitis: Secondary | ICD-10-CM | POA: Diagnosis not present

## 2020-04-10 DIAGNOSIS — G8929 Other chronic pain: Secondary | ICD-10-CM | POA: Diagnosis not present

## 2020-04-10 DIAGNOSIS — E039 Hypothyroidism, unspecified: Secondary | ICD-10-CM | POA: Diagnosis not present

## 2020-05-03 DIAGNOSIS — G4733 Obstructive sleep apnea (adult) (pediatric): Secondary | ICD-10-CM | POA: Diagnosis not present

## 2020-05-05 DIAGNOSIS — G4733 Obstructive sleep apnea (adult) (pediatric): Secondary | ICD-10-CM | POA: Diagnosis not present

## 2020-05-08 ENCOUNTER — Other Ambulatory Visit: Payer: Self-pay

## 2020-05-08 ENCOUNTER — Ambulatory Visit
Admission: RE | Admit: 2020-05-08 | Discharge: 2020-05-08 | Disposition: A | Discharge status: Home / Self Care | Payer: Medicare Other | Source: Ambulatory Visit | Attending: Internal Medicine | Admitting: Internal Medicine

## 2020-05-08 DIAGNOSIS — Z1231 Encounter for screening mammogram for malignant neoplasm of breast: Secondary | ICD-10-CM

## 2020-05-15 DIAGNOSIS — H2511 Age-related nuclear cataract, right eye: Secondary | ICD-10-CM | POA: Diagnosis not present

## 2020-05-16 DIAGNOSIS — H2512 Age-related nuclear cataract, left eye: Secondary | ICD-10-CM | POA: Diagnosis not present

## 2020-06-01 DIAGNOSIS — M79604 Pain in right leg: Secondary | ICD-10-CM | POA: Diagnosis not present

## 2020-06-01 DIAGNOSIS — M79605 Pain in left leg: Secondary | ICD-10-CM | POA: Diagnosis not present

## 2020-06-02 DIAGNOSIS — G4733 Obstructive sleep apnea (adult) (pediatric): Secondary | ICD-10-CM | POA: Diagnosis not present

## 2020-06-04 ENCOUNTER — Encounter: Payer: Self-pay | Admitting: Neurology

## 2020-06-07 DIAGNOSIS — G4733 Obstructive sleep apnea (adult) (pediatric): Secondary | ICD-10-CM | POA: Diagnosis not present

## 2020-06-07 DIAGNOSIS — M79604 Pain in right leg: Secondary | ICD-10-CM | POA: Diagnosis not present

## 2020-06-07 DIAGNOSIS — M79605 Pain in left leg: Secondary | ICD-10-CM | POA: Diagnosis not present

## 2020-06-12 ENCOUNTER — Ambulatory Visit: Payer: Medicare Other | Admitting: Neurology

## 2020-06-12 DIAGNOSIS — H2512 Age-related nuclear cataract, left eye: Secondary | ICD-10-CM | POA: Diagnosis not present

## 2020-06-27 DIAGNOSIS — G894 Chronic pain syndrome: Secondary | ICD-10-CM | POA: Diagnosis not present

## 2020-06-27 DIAGNOSIS — I452 Bifascicular block: Secondary | ICD-10-CM | POA: Diagnosis not present

## 2020-06-27 DIAGNOSIS — N6019 Diffuse cystic mastopathy of unspecified breast: Secondary | ICD-10-CM | POA: Diagnosis not present

## 2020-06-27 DIAGNOSIS — L989 Disorder of the skin and subcutaneous tissue, unspecified: Secondary | ICD-10-CM | POA: Diagnosis not present

## 2020-06-27 DIAGNOSIS — R7301 Impaired fasting glucose: Secondary | ICD-10-CM | POA: Diagnosis not present

## 2020-06-27 DIAGNOSIS — K828 Other specified diseases of gallbladder: Secondary | ICD-10-CM | POA: Diagnosis not present

## 2020-06-27 DIAGNOSIS — E785 Hyperlipidemia, unspecified: Secondary | ICD-10-CM | POA: Diagnosis not present

## 2020-06-27 DIAGNOSIS — D3500 Benign neoplasm of unspecified adrenal gland: Secondary | ICD-10-CM | POA: Diagnosis not present

## 2020-07-03 DIAGNOSIS — G4733 Obstructive sleep apnea (adult) (pediatric): Secondary | ICD-10-CM | POA: Diagnosis not present

## 2020-07-05 ENCOUNTER — Encounter: Payer: Self-pay | Admitting: Neurology

## 2020-07-05 ENCOUNTER — Ambulatory Visit: Payer: Medicare Other | Admitting: Neurology

## 2020-07-05 ENCOUNTER — Other Ambulatory Visit: Payer: Self-pay

## 2020-07-05 VITALS — BP 112/76 | HR 74 | Ht 62.5 in | Wt 231.0 lb

## 2020-07-05 DIAGNOSIS — Z9989 Dependence on other enabling machines and devices: Secondary | ICD-10-CM | POA: Diagnosis not present

## 2020-07-05 DIAGNOSIS — G4700 Insomnia, unspecified: Secondary | ICD-10-CM

## 2020-07-05 DIAGNOSIS — G4733 Obstructive sleep apnea (adult) (pediatric): Secondary | ICD-10-CM

## 2020-07-05 NOTE — Progress Notes (Signed)
Subjective:    Patient ID: Elizabeth Barron is a 75 y.o. female.  HPI     Interim history:   Ms. Elizabeth Barron is a 75 year old right-handed woman with an underlying complex medical history of arthritis, low back pain, hypothyroidism, hyperlipidemia, glaucoma, reflux disease, restless leg syndrome, and morbid obesity with a BMI of over 40, who presents for follow-up consultation of her obstructive sleep apnea on CPAP therapy.  The patient is unaccompanied today.  I first met her at the request of her primary care physician on 07/05/2019, at which time she reported snoring and excessive daytime somnolence.  She had a baseline sleep study in April 2021 and a CPAP titration study subsequent to that.  Baseline AHI was 17.1/h, O2 nadir 85%.    She saw Debbora Presto, NP in the interim on 12/15/2019, at which time she was compliant with her CPAP but had trouble sleeping through the night.  She needed updated supplies.  She was encouraged to continue with her CPAP.  She was advised to follow-up in 6 months.   Today, 07/05/20: I reviewed her CPAP compliance data from 06/04/2020 through 07/03/2020, which is a total of 30 days, during which time she used her machine 29 days with percent use days greater than 4 hours at 93%, indicating excellent compliance, average usage on the lower side at 5 hours and 11 minutes, AHI at goal at 1.5/h, leak acceptable with a 95th percentile at 15.1 L/min on a pressure of 10 cm with EPR of 3.  She reports that she uses her CPAP, sometimes her mouth is dry.  She does not sleep well through the night, this is an ongoing problem, has not worsened after starting CPAP but is not necessarily better.  Her legs do not hurt as in the past.  She tried Mirapex through her primary care physician for about 4 weeks and it did not help.  Her endocrinologist, Dr. Forde Dandy, put her back on gabapentin, she has been on it for the past couple of weeks and it helps a little bit.  She had tried Advil PM in the past but  was discouraged from using anti-inflammatory medication due to GI issues.  She also tried an over-the-counter sleep aid which made her somewhat groggy, does not want to take Benadryl because she has to take it sometimes for itching and she does not want it to become ineffective.  She had tried melatonin in the past but not recently.  She would be willing to try melatonin again.  Otherwise, she has been stable.  She uses a walker for longer distances, typically not for short distances or at home.  She has not fallen.   The patient's allergies, current medications, family history, past medical history, past social history, past surgical history and problem list were reviewed and updated as appropriate.  Previously:   07/05/19: (She) reports chronic difficulty with sleep initiation and sleep maintenance for many years.  She had a sleep study many years ago, maybe 10 or 15 years ago which was negative for sleep apnea at the time.  Prior sleep study results are not available for my review today.  I reviewed your office note from 06/16/2019.  Her Epworth sleepiness score is 16 out of 24.  She is sleepy during the day and sleeps during the day often.  She has a bedtime typically after midnight and sometimes after 1 AM, sometimes she sleeps better in the recliner because of less leg pain.  She is followed by  pain management at [facility].  Her primary care physician has taken her off of her gabapentin recently in the past 2 weeks or so because of nausea.  She takes Aleve as needed.  She has tried over-the-counter sleep aids including p.m. type medication and melatonin some years ago.  She has tried Ambien which made her groggy or drowsy during the day, she may have tried [place], she is not sure if she tried Belsomra or trazodone.  She typically gets out of bed around 6 AM.  She goes to the bathroom once in the early morning hours.  She has had occasional morning headaches and has symptoms of restless legs but often she  has leg pain which comes from the back.  Her sister has a diagnosis of sleep apnea but is not on a CPAP machine.  The patient is a non-smoker and does not drink alcohol and drinks no caffeine on a daily basis.  She is retired, she lives with her husband.  They have no pets at the house.  She has a TV on at night in her bedroom but typically turns it off when she is ready to fall asleep.   Her Past Medical History Is Significant For: Past Medical History:  Diagnosis Date  . Arthritis    knees. back  . Colon polyps   . GERD (gastroesophageal reflux disease)   . Glaucoma   . Headache   . Hypercholesteremia   . Hypothyroidism    thyroid removed  . Insomnia   . Low back pain   . Obesity   . Osteoarthritis    r hip, left hip  . Restless leg syndrome     Her Past Surgical History Is Significant For: Past Surgical History:  Procedure Laterality Date  . ANTERIOR CRUCIATE LIGAMENT REPAIR    . BREAST LUMPECTOMY     L breast- benign  . BUNIONECTOMY Left 2012  . COLONOSCOPY WITH PROPOFOL N/A 10/11/2014   Procedure: COLONOSCOPY WITH PROPOFOL;  Surgeon: Garlan Fair, MD;  Location: [facility];  Service: Endoscopy;  Laterality: N/A;  . KNEE SURGERY     bilateral scope surgery  . MAXIMUM ACCESS (MAS)POSTERIOR LUMBAR INTERBODY FUSION (PLIF) 2 LEVEL N/A 12/06/2015   Procedure: Lumbar four-five, Lumbar five-Sacral one MAXIMUM ACCESS POSTERIOR LUMBAR INTERBODY FUSION   ;  Surgeon: Eustace Moore, MD;  Location: [facility];  Service: Neurosurgery;  Laterality: N/A;  . RADIOLOGY WITH ANESTHESIA Left 09/05/2016   Procedure: MRI OF LEFT HIP WITHOUT;  Surgeon: Radiologist, Medication, MD;  Location: [place];  Service: Radiology;  Laterality: Left;  . THYROIDECTOMY    . TONSILLECTOMY  1955   child  . TUBAL LIGATION    . VAGINAL HYSTERECTOMY  1981    Her Family History Is Significant For: Family History  Problem Relation Age of Onset  . Cancer Mother        pancreatic  . Diabetes Mother   .  Hypertension Mother   . Cancer Father        esophageal  . Diabetes Father   . Hypertension Father   . Diabetes Sister   . Other Sister        dialysis    Her Social History Is Significant For: Social History   Socioeconomic History  . Marital status: Married    Spouse name: Not on file  . Number of children: 1  . Years of education: Not on file  . Highest education level: High school graduate  Occupational  History    Employer: RETIRED  Tobacco Use  . Smoking status: Never Smoker  . Smokeless tobacco: Never Used  Vaping Use  . Vaping Use: Never used  Substance and Sexual Activity  . Alcohol use: Yes    Comment: OCCASIONAL WINE  . Drug use: No  . Sexual activity: Not on file  Other Topics Concern  . Not on file  Social History Narrative   Lives with souse   caffeine none   Social Determinants of Health   Financial Resource Strain: Not on file  Food Insecurity: Not on file  Transportation Needs: Not on file  Physical Activity: Not on file  Stress: Not on file  Social Connections: Not on file    Her Allergies Are:  No Known Allergies:   Her Current Medications Are:  Outpatient Encounter Medications as of 07/05/2020  Medication Sig  . acetaminophen (TYLENOL) 650 MG CR tablet Take 1,300 mg by mouth daily as needed for pain.   Marland Kitchen aspirin EC 81 MG tablet Take 81 mg by mouth daily.  . bifidobacterium infantis (ALIGN) capsule See admin instructions.  . brinzolamide (AZOPT) 1 % ophthalmic suspension Place 1 drop into both eyes 2 (two) times daily.  . Cyanocobalamin (VITAMIN B 12) 500 MCG TABS Take 2 tablets by mouth daily.  . diclofenac (VOLTAREN) 75 MG EC tablet Take 75 mg by mouth 2 (two) times daily.  Marland Kitchen esomeprazole (NEXIUM) 40 MG capsule Take 40 mg by mouth daily.  . famotidine (PEPCID) 20 MG tablet 1 tablet at bedtime as needed  . gabapentin (NEURONTIN) 300 MG capsule Take 300 mg by mouth daily.  Marland Kitchen levothyroxine (SYNTHROID, LEVOTHROID) 175 MCG tablet Take 175  mcg by mouth See admin instructions. Take 1 tablet Monday through Friday, 1/2 tablet on Saturday, and no tablets on Sunday  . rosuvastatin (CRESTOR) 5 MG tablet Take 1 tablet by mouth 2 (two) times a week. On Sunday and Wednesday  . Vitamin D, Ergocalciferol, (DRISDOL) 50000 units CAPS capsule Take 50,000 Units by mouth every Friday.  . Wheat Dextrin (BENEFIBER) POWD See admin instructions.   No facility-administered encounter medications on file as of 07/05/2020.  :  Review of Systems:  Out of a complete 14 point review of systems, all are reviewed and negative with the exception of these symptoms as listed below: Review of Systems  Neurological:       Here for f/u on cpap. Reports she is still waking up every 1-2 hours but in the morning she does not feel as tired.  Pt interested in discussing inspire? Pt wanted to note she was started on mirapex for RLS a few months back, but did not receive any benefit.      Objective:  Neurological Exam  Physical Exam Physical Examination:   Vitals:   07/05/20 0958  BP: 112/76  Pulse: 74  SpO2: 96%    General Examination: The patient is a very pleasant 75 y.o. female in no acute distress. She appears well-developed and well-nourished and well groomed.   HEENT: Normocephalic, atraumatic, pupils are equal, round and reactive to light, tracking is well-preserved, hearing grossly intact, face is symmetric with normal facial animation.  Speech is clear without dysarthria, hypophonia or voice tremor.  Airway examination reveals stable findings.  Tongue protrudes centrally and palate elevates symmetrically.   Chest: Clear to auscultation without wheezing, rhonchi or crackles noted.  Heart: S1+S2+0, regular and normal without murmurs, rubs or gallops noted.   Abdomen: Soft, non-tender and non-distended.  Extremities:  There is no pitting edema in the distal lower extremities bilaterally.   Skin: Warm and dry without trophic changes noted.    Musculoskeletal: exam reveals no obvious joint deformities, tenderness or joint swelling or erythema.   Neurologically:  Mental status: The patient is awake, alert and oriented in all 4 spheres. Her immediate and remote memory, attention, language skills and fund of knowledge are appropriate. There is no evidence of aphasia, agnosia, apraxia or anomia. Speech is clear with normal prosody and enunciation. Thought process is linear. Mood is normal and affect is normal.  Cranial nerves II - XII are as described above under HEENT exam.  Motor exam: Normal bulk, strength and tone is noted. There is no obvious tremor. Fine motor skills and coordination: grossly intact.  Cerebellar testing: No dysmetria or intention. There is no truncal or gait ataxia.  Sensory exam: intact to light touch in the upper and lower extremities.  Gait, station and balance: She stands slowly, mild increase in lumbar kyphosis, walk with a rolling walker.   Assessment and Plan:  In summary, Christinea Brizuela is a very pleasant 75 year old female with an underlying complex medical history of arthritis, low back pain, hypothyroidism, hyperlipidemia, glaucoma, reflux disease, restless leg syndrome, and morbid obesity with a BMI of over 40, who presents for follow-up consultation of her obstructive sleep apnea.  She has been on CPAP therapy.  She is compliant with treatment.  She had sleep studies in 2021.  She received her CPAP machine in the fall 2021.  She has continued to struggle with difficulty maintaining sleep.  She has been on prescription medicine before and has tried over-the-counter medication.  She has not tried melatonin in some time.  She is encouraged to try melatonin at night.  She is benefiting from treatment and that her daytime somnolence is better.  She is encouraged to continue with her CPAP consistently.  She is commended for her treatment adherence.  She is advised to follow-up routinely to see the nurse  practitioner in about 6 months, sooner if needed.  I answered all her questions today and she was in agreement with the plan.   I spent 33 minutes in total face-to-face time and in reviewing records during pre-charting, more than 50% of which was spent in counseling and coordination of care, reviewing test results, reviewing medications and treatment regimen and/or in discussing or reviewing the diagnosis of OSA, the prognosis and treatment options. Pertinent laboratory and imaging test results that were available during this visit with the patient were reviewed by me and considered in my medical decision making (see chart for details).

## 2020-07-05 NOTE — Patient Instructions (Signed)
It was nice to see you again today.  You are compliant with your CPAP.  Your apnea scores and usage numbers look good.  We can continue at the same settings.  Please continue using your CPAP regularly. While your insurance requires that you use CPAP at least 4 hours each night on 70% of the nights, I recommend, that you not skip any nights and use it throughout the night if you can. Getting used to CPAP and staying with the treatment long term does take time and patience and discipline. Untreated obstructive sleep apnea when it is moderate to severe can have an adverse impact on cardiovascular health and raise her risk for heart disease, arrhythmias, hypertension, congestive heart failure, stroke and diabetes. Untreated obstructive sleep apnea causes sleep disruption, nonrestorative sleep, and sleep deprivation. This can have an impact on your day to day functioning and cause daytime sleepiness and impairment of cognitive function, memory loss, mood disturbance, and problems focussing. Using CPAP regularly can improve these symptoms.   You can try Melatonin at night for sleep: take 3 mg, one to 2 hours before your bedtime. You can go up to 6 mg if needed. It is over the counter and comes in pill form, chewable form and spray, if you prefer.   We can see you in 1 year, you can see Debbora Presto, nurse practitioner again.

## 2020-07-10 DIAGNOSIS — Z961 Presence of intraocular lens: Secondary | ICD-10-CM | POA: Diagnosis not present

## 2020-08-02 DIAGNOSIS — G4733 Obstructive sleep apnea (adult) (pediatric): Secondary | ICD-10-CM | POA: Diagnosis not present

## 2020-08-07 DIAGNOSIS — H43813 Vitreous degeneration, bilateral: Secondary | ICD-10-CM | POA: Diagnosis not present

## 2020-08-14 DIAGNOSIS — H401132 Primary open-angle glaucoma, bilateral, moderate stage: Secondary | ICD-10-CM | POA: Diagnosis not present

## 2020-08-14 DIAGNOSIS — H43813 Vitreous degeneration, bilateral: Secondary | ICD-10-CM | POA: Diagnosis not present

## 2020-08-14 DIAGNOSIS — H35373 Puckering of macula, bilateral: Secondary | ICD-10-CM | POA: Diagnosis not present

## 2020-08-14 DIAGNOSIS — H35451 Secondary pigmentary degeneration, right eye: Secondary | ICD-10-CM | POA: Diagnosis not present

## 2020-09-04 DIAGNOSIS — Z20822 Contact with and (suspected) exposure to covid-19: Secondary | ICD-10-CM | POA: Diagnosis not present

## 2020-09-12 DIAGNOSIS — H35451 Secondary pigmentary degeneration, right eye: Secondary | ICD-10-CM | POA: Diagnosis not present

## 2020-09-12 DIAGNOSIS — H35373 Puckering of macula, bilateral: Secondary | ICD-10-CM | POA: Diagnosis not present

## 2020-09-12 DIAGNOSIS — H3561 Retinal hemorrhage, right eye: Secondary | ICD-10-CM | POA: Diagnosis not present

## 2020-09-12 DIAGNOSIS — H43813 Vitreous degeneration, bilateral: Secondary | ICD-10-CM | POA: Diagnosis not present

## 2020-09-21 DIAGNOSIS — K219 Gastro-esophageal reflux disease without esophagitis: Secondary | ICD-10-CM | POA: Diagnosis not present

## 2020-09-21 DIAGNOSIS — G8929 Other chronic pain: Secondary | ICD-10-CM | POA: Diagnosis not present

## 2020-09-21 DIAGNOSIS — G4700 Insomnia, unspecified: Secondary | ICD-10-CM | POA: Diagnosis not present

## 2020-09-21 DIAGNOSIS — E7800 Pure hypercholesterolemia, unspecified: Secondary | ICD-10-CM | POA: Diagnosis not present

## 2020-09-21 DIAGNOSIS — E039 Hypothyroidism, unspecified: Secondary | ICD-10-CM | POA: Diagnosis not present

## 2020-10-31 DIAGNOSIS — G4733 Obstructive sleep apnea (adult) (pediatric): Secondary | ICD-10-CM | POA: Diagnosis not present

## 2020-11-06 DIAGNOSIS — L300 Nummular dermatitis: Secondary | ICD-10-CM | POA: Diagnosis not present

## 2020-11-06 DIAGNOSIS — L821 Other seborrheic keratosis: Secondary | ICD-10-CM | POA: Diagnosis not present

## 2020-11-06 DIAGNOSIS — B353 Tinea pedis: Secondary | ICD-10-CM | POA: Diagnosis not present

## 2020-11-10 DIAGNOSIS — H35373 Puckering of macula, bilateral: Secondary | ICD-10-CM | POA: Diagnosis not present

## 2020-11-10 DIAGNOSIS — H3561 Retinal hemorrhage, right eye: Secondary | ICD-10-CM | POA: Diagnosis not present

## 2020-11-10 DIAGNOSIS — H35451 Secondary pigmentary degeneration, right eye: Secondary | ICD-10-CM | POA: Diagnosis not present

## 2020-11-10 DIAGNOSIS — H43813 Vitreous degeneration, bilateral: Secondary | ICD-10-CM | POA: Diagnosis not present

## 2020-12-25 DIAGNOSIS — G4733 Obstructive sleep apnea (adult) (pediatric): Secondary | ICD-10-CM | POA: Diagnosis not present

## 2020-12-27 DIAGNOSIS — D3500 Benign neoplasm of unspecified adrenal gland: Secondary | ICD-10-CM | POA: Diagnosis not present

## 2020-12-27 DIAGNOSIS — E785 Hyperlipidemia, unspecified: Secondary | ICD-10-CM | POA: Diagnosis not present

## 2020-12-27 DIAGNOSIS — E559 Vitamin D deficiency, unspecified: Secondary | ICD-10-CM | POA: Diagnosis not present

## 2020-12-27 DIAGNOSIS — G894 Chronic pain syndrome: Secondary | ICD-10-CM | POA: Diagnosis not present

## 2020-12-27 DIAGNOSIS — D649 Anemia, unspecified: Secondary | ICD-10-CM | POA: Diagnosis not present

## 2020-12-27 DIAGNOSIS — R7301 Impaired fasting glucose: Secondary | ICD-10-CM | POA: Diagnosis not present

## 2020-12-27 DIAGNOSIS — K760 Fatty (change of) liver, not elsewhere classified: Secondary | ICD-10-CM | POA: Diagnosis not present

## 2020-12-27 DIAGNOSIS — G4730 Sleep apnea, unspecified: Secondary | ICD-10-CM | POA: Diagnosis not present

## 2020-12-27 DIAGNOSIS — H409 Unspecified glaucoma: Secondary | ICD-10-CM | POA: Diagnosis not present

## 2020-12-27 DIAGNOSIS — E890 Postprocedural hypothyroidism: Secondary | ICD-10-CM | POA: Diagnosis not present

## 2021-01-13 DIAGNOSIS — Z23 Encounter for immunization: Secondary | ICD-10-CM | POA: Diagnosis not present

## 2021-01-29 ENCOUNTER — Ambulatory Visit: Payer: Medicare Other | Admitting: Cardiology

## 2021-01-29 NOTE — Progress Notes (Deleted)
Date:  01/29/2021   ID:  Elizabeth Barron, Elizabeth Barron [birth year], MRN [id number]  PCP:  Seward Carol, MD  Cardiologist:  Rex Kras, DO, [facility] (established care 01/05/2020)  *** 02/03/2020  No chief complaint on file.   HPI  Elizabeth Barron is a 75 y.o. female who presents to the office with a chief complaint of " 1 year follow-up for PSVT." Patient's past medical history and cardiovascular risk factors include: Right bundle branch block, left anterior fascicular block, hyperlipidemia, hypothyroidism, aortic atherosclerosis (06/22/2019), bilateral adrenal gland nodules (06/22/2019), postmenopausal female, advanced age.  She is referred to the office at the request of her endocrinologist for evaluation of bifascicular block, LVH, fatigue.  Prior to establishing care patient was undergoing gastroenterology studies and EKG was noted to show right bundle branch block with left anterior fascicular block and referred to cardiology for further evaluation and management.  Since establishing care she has undergone an ischemic evaluation as outlined below.  Extended Holter monitor noted underlying rhythm to be sinus and patient was found to have asymptomatic episodes of PSVT's and 1 episode of asymptomatic NSVT 4 beats in duration.  We discussed initiating pharmacological therapy such as AV node blocking agents; however, since these episodes are asymptomatic she wanted to manage it medically.  She now presents for 1 year follow-up.  Since last office visit, ***   FUNCTIONAL STATUS: No structured exercise program or daily routine. Limited factors are back and legs.    ALLERGIES: No Known Allergies  MEDICATION LIST PRIOR TO VISIT: No outpatient medications have been marked as taking for the 01/29/21 encounter (Appointment) with Terri Skains, Telina Kleckley, DO.     PAST MEDICAL HISTORY: Past Medical History:  Diagnosis Date   Arthritis    knees. back   Colon polyps    GERD (gastroesophageal reflux  disease)    Glaucoma    Headache    Hypercholesteremia    Hypothyroidism    thyroid removed   Insomnia    Low back pain    Obesity    Osteoarthritis    r hip, left hip   Restless leg syndrome     PAST SURGICAL HISTORY: Past Surgical History:  Procedure Laterality Date   ANTERIOR CRUCIATE LIGAMENT REPAIR     BREAST LUMPECTOMY     L breast- benign   BUNIONECTOMY Left 2012   COLONOSCOPY WITH PROPOFOL N/A 10/11/2014   Procedure: COLONOSCOPY WITH PROPOFOL;  Surgeon: Garlan Fair, MD;  Location: [facility];  Service: Endoscopy;  Laterality: N/A;   KNEE SURGERY     bilateral scope surgery   MAXIMUM ACCESS (MAS)POSTERIOR LUMBAR INTERBODY FUSION (PLIF) 2 LEVEL N/A 12/06/2015   Procedure: Lumbar four-five, Lumbar five-Sacral one MAXIMUM ACCESS POSTERIOR LUMBAR INTERBODY FUSION   ;  Surgeon: Eustace Moore, MD;  Location: [place] [facility];  Service: Neurosurgery;  Laterality: N/A;   RADIOLOGY WITH ANESTHESIA Left 09/05/2016   Procedure: MRI OF LEFT HIP WITHOUT;  Surgeon: Radiologist, Medication, MD;  Location: [place];  Service: Radiology;  Laterality: Left;   [place]   child   TUBAL LIGATION     VAGINAL HYSTERECTOMY  1981    FAMILY HISTORY: The patient family history includes Cancer in her father and mother; Diabetes in her father, mother, and sister; Hypertension in her father and mother; Other in her sister.  SOCIAL HISTORY:  The patient  reports that she has never smoked. She has never used smokeless tobacco. She reports current alcohol  use. She reports that she does not use drugs.  REVIEW OF SYSTEMS: Review of Systems  Constitutional: Positive for malaise/fatigue. Negative for chills and fever.  HENT:  Negative for hoarse voice and nosebleeds.   Eyes:  Negative for discharge, double vision and pain.  Cardiovascular:  Negative for chest pain, claudication, dyspnea on exertion, leg swelling, near-syncope, orthopnea, palpitations, paroxysmal nocturnal  dyspnea and syncope.  Respiratory:  Negative for hemoptysis and shortness of breath.   Musculoskeletal:  Positive for back pain. Negative for muscle cramps and myalgias.  Gastrointestinal:  Negative for abdominal pain, constipation, diarrhea, hematemesis, hematochezia, melena, nausea and vomiting.  Neurological:  Negative for dizziness and light-headedness.  PHYSICAL EXAM: Vitals with BMI 07/05/2020 02/03/2020 01/05/2020  Height 5' 2.5" _0  _1   Weight 231 lbs 234 lbs 229 lbs  BMI 41.55 08.67 61.95  Systolic 093 267 124  Diastolic 76 74 82  Pulse 74 74 82   CONSTITUTIONAL: Well-developed and well-nourished. No acute distress.  SKIN: Skin is warm and dry. No rash noted. No cyanosis. No pallor. No jaundice HEAD: Normocephalic and atraumatic.  EYES: No scleral icterus MOUTH/THROAT: Moist oral membranes.  NECK: No JVD present. No thyromegaly noted. No carotid bruits  LYMPHATIC: No visible cervical adenopathy.  CHEST Normal respiratory effort. No intercostal retractions  LUNGS: Clear to auscultation bilaterally. No stridor. No wheezes. No rales.  CARDIOVASCULAR: Regular rate and rhythm, positive S1-S2, no murmurs rubs or gallops appreciated. ABDOMINAL: No apparent ascites.  EXTREMITIES: No peripheral edema  HEMATOLOGIC: No significant bruising NEUROLOGIC: Oriented to person, place, and time. Nonfocal. Normal muscle tone.  PSYCHIATRIC: Normal mood and affect. Normal behavior. Cooperative  CARDIAC DATABASE: EKG: 01/05/2020: Sinus  Rhythm, 73bpm, left axis deviation, right bundle branch block, left anterior fascicular block, left ventricular hypertrophy, poor R wave progression, without explaining injury pattern.  Echocardiogram: 01/14/2020: Normal LV systolic function with visual EF 60-65%. Left ventricle cavity is normal in size. Mild left ventricular hypertrophy.  Normal global wall motion. Normal diastolic filling pattern, normal LAP.  Trace aortic regurgitation. Mild tricuspid  regurgitation. No evidence of pulmonary hypertension. No prior study for comparison.   Stress Testing: Lexiscan Tetrofosmin Stress Test  01/17/2020: Nondiagnostic ECG stress. Mild breast attenuation in the anterior wall noted.   Normal myocardial perfusion. Gated SPECT imaging of the left ventricle was normal. All segments of left ventricle demonstrated normal wall motion and thickening. Stress LV EF is normal 59%.  No previous exam available for comparison. Low risk.   Heart Catheterization: None  7 day extended Holter monitor: Dominant rhythm normal sinus rhythm. Heart rate 53-210 bpm.  Avg HR 83 bpm. No atrial fibrillation, high grade AV block, pauses (3 seconds or longer). Total ventricular ectopic burden <1%.  Longest episode of ventricular bigeminy was 3.9 seconds.  1 episode of NSVT that was 54 beats in duration and at HR ranging between (128-158bpm).  Total supraventricular ectopic burden <1%.   7 episodes of SVT with average HR of 154 bpm and the longest episode was 6 beats in duration at a rate of 210 bpm. Patient triggered events: 0.    LABORATORY DATA: CBC Latest Ref Rng & Units 06/22/2019 09/05/2016 11/29/2015  WBC 4.0 - 10.5 K/uL 7.5 6.5 7.4  Hemoglobin 12.0 - 15.0 g/dL 15.0 14.9 14.9  Hematocrit 36.0 - 46.0 % 49.2(H) 45.5 47.1(H)  Platelets 150 - 400 K/uL 238 246 253    CMP Latest Ref Rng & Units 06/22/2019 09/05/2016 11/29/2015  Glucose 70 - 99 mg/dL 118(H)  103(H) 99  BUN 8 - 23 mg/dL _0 Creatinine 0.44 - 1.00 mg/dL 0.82 0.72 0.75  Sodium 135 - 145 mmol/L 141 138 140  Potassium 3.5 - 5.1 mmol/L 4.3 3.9 3.9  Chloride 98 - 111 mmol/L 105 106 106  CO2 22 - 32 mmol/L _1 Calcium 8.9 - 10.3 mg/dL 9.6 9.6 9.8    Lipid Panel  No results found for: CHOL, TRIG, HDL, CHOLHDL, VLDL, LDLCALC, LDLDIRECT, LABVLDL  No components found for: NTPROBNP No results for input(s): PROBNP in the last 8760 hours. No results for input(s): TSH in the last 8760  hours.  BMP No results for input(s): NA, K, CL, CO2, GLUCOSE, BUN, CREATININE, CALCIUM, GFRNONAA, GFRAA in the last 8760 hours.   HEMOGLOBIN A1C No results found for: HGBA1C, MPG   External Labs: Collected: 12/16/2018 Lipid profile: Total cholesterol 175, triglycerides 64, HDL 56, LDL 106, non HDL 119  Collected: 05/19/2018 Creatinine: 0.8 mg/dL. eGFR: 70 mL/min per 1.73 m  IMPRESSION:    ICD-10-CM   1. Bifascicular block  I45.2     2. Benign hypertension  I10     3. Hypercholesteremia  E78.00     4. Aortic atherosclerosis (HCC)  I70.0     5. Obesity due to excess calories without serious comorbidity, unspecified classification  E66.09         RECOMMENDATIONS: Elizabeth Barron is a 75 y.o. female whose past medical history and cardiac risk factors include: Right bundle branch block, left anterior fascicular block, hyperlipidemia, hypothyroidism, aortic atherosclerosis (06/22/2019), bilateral adrenal gland nodules (06/22/2019), postmenopausal female, advanced age.  Bifascicular block (RBBB plus left anterior fascicular block): Patient symptoms include generalized tired and fatigued.   She was recommended to undergo an extended Holter monitor to evaluate for heart blocks, pauses, or dysrhythmias.   7-day Holter monitor noted underlying rhythm to be sinus without underlying dysrhythmias, pauses, or AV blocks.  The patient did have 1 episode of NSVT of 4 beats in duration and several episodes of PSVT.  We discussed starting AV nodal blocking agents but she chooses not to be on pharmacological therapy at this time as there asymptomatic events, which is acceptable.   Of note, her one episode of NSVT 4 beats was while she was sleeping and she does have underlying sleep apnea which is being treated.  And her ischemic work-up notes preserved LVEF and normal myocardial perfusion.  We will continue to monitor.  Benign essential hypertension:  Office blood pressure well controlled.   Currently managed by primary care provider.  Hypercholesterolemia:  Most recent lipid profile reviewed.  Currently on statin therapy.  Does not endorse any myalgias.  Currently managed by primary care provider.  Aortic atherosclerosis: Continue aspirin and statin therapy.  ***   FINAL MEDICATION LIST END OF ENCOUNTER: No orders of the defined types were placed in this encounter.     Current Outpatient Medications:    acetaminophen (TYLENOL) 650 MG CR tablet, Take 1,300 mg by mouth daily as needed for pain. , Disp: , Rfl:    aspirin EC 81 MG tablet, Take 81 mg by mouth daily., Disp: , Rfl:    bifidobacterium infantis (ALIGN) capsule, See admin instructions., Disp: , Rfl:    brinzolamide (AZOPT) 1 % ophthalmic suspension, Place 1 drop into both eyes 2 (two) times daily., Disp: , Rfl:    Cyanocobalamin (VITAMIN B 12) 500 MCG TABS, Take 2 tablets by mouth daily., Disp: , Rfl:    diclofenac (  VOLTAREN) 75 MG EC tablet, Take 75 mg by mouth 2 (two) times daily., Disp: , Rfl:    esomeprazole (NEXIUM) 40 MG capsule, Take 40 mg by mouth daily., Disp: , Rfl: 3   famotidine (PEPCID) 20 MG tablet, 1 tablet at bedtime as needed, Disp: , Rfl:    gabapentin (NEURONTIN) 300 MG capsule, Take 300 mg by mouth daily., Disp: , Rfl:    levothyroxine (SYNTHROID, LEVOTHROID) 175 MCG tablet, Take 175 mcg by mouth See admin instructions. Take 1 tablet Monday through Friday, 1/2 tablet on Saturday, and no tablets on Sunday, Disp: , Rfl:    rosuvastatin (CRESTOR) 5 MG tablet, Take 1 tablet by mouth 2 (two) times a week. On Sunday and Wednesday, Disp: , Rfl:    Vitamin D, Ergocalciferol, (DRISDOL) 50000 units CAPS capsule, Take 50,000 Units by mouth every Friday., Disp: , Rfl:    Wheat Dextrin (BENEFIBER) POWD, See admin instructions., Disp: , Rfl:   No orders of the defined types were placed in this encounter.  There are no Patient Instructions on file for this visit.   --Continue cardiac medications as  reconciled in final medication list. --No follow-ups on file. Or sooner if needed. --Continue follow-up with your primary care physician regarding the management of your other chronic comorbid conditions.  Patient's questions and concerns were addressed to her satisfaction. She voices understanding of the instructions provided during this encounter.   This note was created using a voice recognition software as a result there may be grammatical errors inadvertently enclosed that do not reflect the nature of this encounter. Every attempt is made to correct such errors.  Rex Kras, [place], [facility]  Pager: [phone] Office: [phone]

## 2021-01-31 DIAGNOSIS — M545 Low back pain, unspecified: Secondary | ICD-10-CM | POA: Diagnosis not present

## 2021-01-31 DIAGNOSIS — M5451 Vertebrogenic low back pain: Secondary | ICD-10-CM | POA: Diagnosis not present

## 2021-01-31 DIAGNOSIS — R110 Nausea: Secondary | ICD-10-CM | POA: Diagnosis not present

## 2021-02-02 DIAGNOSIS — H43813 Vitreous degeneration, bilateral: Secondary | ICD-10-CM | POA: Diagnosis not present

## 2021-02-02 DIAGNOSIS — H35451 Secondary pigmentary degeneration, right eye: Secondary | ICD-10-CM | POA: Diagnosis not present

## 2021-02-02 DIAGNOSIS — H35373 Puckering of macula, bilateral: Secondary | ICD-10-CM | POA: Diagnosis not present

## 2021-02-02 DIAGNOSIS — H3561 Retinal hemorrhage, right eye: Secondary | ICD-10-CM | POA: Diagnosis not present

## 2021-02-05 ENCOUNTER — Ambulatory Visit: Payer: Medicare Other | Admitting: Cardiology

## 2021-02-05 ENCOUNTER — Other Ambulatory Visit: Payer: Self-pay

## 2021-02-05 ENCOUNTER — Encounter: Payer: Self-pay | Admitting: Cardiology

## 2021-02-05 VITALS — BP 144/81 | HR 68 | Resp 16 | Ht 62.0 in | Wt 230.6 lb

## 2021-02-05 DIAGNOSIS — I471 Supraventricular tachycardia, unspecified: Secondary | ICD-10-CM

## 2021-02-05 DIAGNOSIS — E039 Hypothyroidism, unspecified: Secondary | ICD-10-CM

## 2021-02-05 DIAGNOSIS — I700 Atherosclerosis of aorta: Secondary | ICD-10-CM | POA: Diagnosis not present

## 2021-02-05 DIAGNOSIS — I452 Bifascicular block: Secondary | ICD-10-CM

## 2021-02-05 DIAGNOSIS — E7800 Pure hypercholesterolemia, unspecified: Secondary | ICD-10-CM

## 2021-02-05 NOTE — Progress Notes (Signed)
Date:  02/05/2021   ID:  Elizabeth Barron, Elizabeth Barron September 27, 1945, MRN [id number]  PCP:  Seward Carol, MD  Cardiologist:  Rex Kras, DO, [facility] (established care 01/05/2020)  Date: 02/05/21 Last Office Visit: 02/03/2020   Chief Complaint  Patient presents with   Follow-up    Hx of PSVT.    HPI  Elizabeth Barron is a 75 y.o. female who presents to the office with a chief complaint of " 1 year follow-up for PSVT." Patient's past medical history and cardiovascular risk factors include: Right bundle branch block, left anterior fascicular block, hyperlipidemia, hypothyroidism, aortic atherosclerosis (06/22/2019), bilateral adrenal gland nodules (06/22/2019), postmenopausal female, advanced age.  She is referred to the office at the request of her endocrinologist for evaluation of bifascicular block, LVH, fatigue.  Prior to establishing care patient was undergoing gastroenterology studies and EKG was noted to show right bundle branch block with left anterior fascicular block and referred to cardiology for further evaluation and management.  Since establishing care she has undergone an ischemic evaluation as outlined below.  Extended Holter monitor noted underlying rhythm to be sinus and patient was found to have asymptomatic episodes of PSVT's and 1 episode of asymptomatic NSVT 4 beats in duration.  We discussed initiating pharmacological therapy such as AV node blocking agents; however, since these episodes are asymptomatic she wanted to manage it medically.  She now presents for 1 year follow-up.  Since last office visit, she is doing well from a cardiovascular standpoint.  She denies any chest pain or shortness of breath at rest or with effort related activities.  She complains of feeling tired and fatigue most likely secondary to peripheral neuropathy, restless leg syndrome, and inability to sleep throughout the night.  Patient states that she has frequent awakenings every 2 hours.  Given  her underlying conduction disease patient denies any episodes of near syncope or syncopal events.  FUNCTIONAL STATUS: No structured exercise program or daily routine. Limited factors are back and legs.    ALLERGIES: No Known Allergies  MEDICATION LIST PRIOR TO VISIT: Current Meds  Medication Sig   acetaminophen (TYLENOL) 650 MG CR tablet Take 1,300 mg by mouth daily as needed for pain.    aspirin EC 81 MG tablet Take 81 mg by mouth daily.   brinzolamide (AZOPT) 1 % ophthalmic suspension Place 1 drop into both eyes 2 (two) times daily.   Cyanocobalamin (VITAMIN B 12) 500 MCG TABS Take 2 tablets by mouth daily.   esomeprazole (NEXIUM) 40 MG capsule Take 40 mg by mouth daily.   famotidine (PEPCID) 20 MG tablet 1 tablet at bedtime as needed   levothyroxine (SYNTHROID, LEVOTHROID) 175 MCG tablet Take 175 mcg by mouth See admin instructions. Take 1 tablet Monday through Friday, 1/2 tablet on Saturday, and no tablets on Sunday   Probiotic Product (ALIGN PO) Take 1 tablet by mouth daily at 12 noon.   rosuvastatin (CRESTOR) 5 MG tablet Take 1 tablet by mouth 2 (two) times a week. On Sunday and Wednesday   Vitamin D, Ergocalciferol, (DRISDOL) 50000 units CAPS capsule Take 50,000 Units by mouth every Friday.   Wheat Dextrin (BENEFIBER) POWD See admin instructions.     PAST MEDICAL HISTORY: Past Medical History:  Diagnosis Date   Arthritis    knees. back   Colon polyps    GERD (gastroesophageal reflux disease)    Glaucoma    Headache    Hypercholesteremia    Hypothyroidism    thyroid removed   Insomnia  Low back pain    Obesity    Osteoarthritis    r hip, left hip   Restless leg syndrome     PAST SURGICAL HISTORY: Past Surgical History:  Procedure Laterality Date   ANTERIOR CRUCIATE LIGAMENT REPAIR     BREAST LUMPECTOMY     L breast- benign   BUNIONECTOMY Left 2012   COLONOSCOPY WITH PROPOFOL N/A 10/11/2014   Procedure: COLONOSCOPY WITH PROPOFOL;  Surgeon: Garlan Fair,  MD;  Location: [facility];  Service: Endoscopy;  Laterality: N/A;   KNEE SURGERY     bilateral scope surgery   MAXIMUM ACCESS (MAS)POSTERIOR LUMBAR INTERBODY FUSION (PLIF) 2 LEVEL N/A 12/06/2015   Procedure: Lumbar four-five, Lumbar five-Sacral one MAXIMUM ACCESS POSTERIOR LUMBAR INTERBODY FUSION   ;  Surgeon: Eustace Moore, MD;  Location: [place] [facility];  Service: Neurosurgery;  Laterality: N/A;   RADIOLOGY WITH ANESTHESIA Left 09/05/2016   Procedure: MRI OF LEFT HIP WITHOUT;  Surgeon: Radiologist, Medication, MD;  Location: [place];  Service: Radiology;  Laterality: Left;   [place]   child   TUBAL LIGATION     VAGINAL HYSTERECTOMY  1981    FAMILY HISTORY: The patient family history includes Cancer in her father and mother; Diabetes in her father, mother, and sister; Hypertension in her father and mother; Other in her sister.  SOCIAL HISTORY:  The patient  reports that she has never smoked. She has never used smokeless tobacco. She reports current alcohol use. She reports that she does not use drugs.  REVIEW OF SYSTEMS: Review of Systems  Constitutional: Positive for malaise/fatigue. Negative for chills and fever.  HENT:  Negative for hoarse voice and nosebleeds.   Eyes:  Negative for discharge, double vision and pain.  Cardiovascular:  Negative for chest pain, claudication, dyspnea on exertion, leg swelling, near-syncope, orthopnea, palpitations, paroxysmal nocturnal dyspnea and syncope.  Respiratory:  Negative for hemoptysis and shortness of breath.   Musculoskeletal:  Positive for back pain. Negative for muscle cramps and myalgias.  Gastrointestinal:  Negative for abdominal pain, constipation, diarrhea, hematemesis, hematochezia, melena, nausea and vomiting.  Neurological:  Positive for paresthesias (bilateral legs). Negative for dizziness and light-headedness.   PHYSICAL EXAM: Vitals with BMI 02/05/2021 07/05/2020 02/03/2020  Height 5' 2"  5' 2.5" 5' 2"    Weight 230 lbs 10 oz 231 lbs 234 lbs  BMI 42.17 97.41 63.84  Systolic 536 468 032  Diastolic 81 76 74  Pulse 68 74 74   CONSTITUTIONAL: Well-developed and well-nourished. No acute distress.  SKIN: Skin is warm and dry. No rash noted. No cyanosis. No pallor. No jaundice HEAD: Normocephalic and atraumatic.  EYES: No scleral icterus MOUTH/THROAT: Moist oral membranes.  NECK: No JVD present. No thyromegaly noted. No carotid bruits  LYMPHATIC: No visible cervical adenopathy.  CHEST Normal respiratory effort. No intercostal retractions  LUNGS: Clear to auscultation bilaterally. No stridor. No wheezes. No rales.  CARDIOVASCULAR: Regular rate and rhythm, positive S1-S2, no murmurs rubs or gallops appreciated. ABDOMINAL: Obese, soft, nontender, nondistended, positive bowel sounds all 4 quadrants. No apparent ascites.  EXTREMITIES: No peripheral edema, compression stockings present, warm to touch, trace left ankle edema HEMATOLOGIC: No significant bruising NEUROLOGIC: Oriented to person, place, and time. Nonfocal. Normal muscle tone.  PSYCHIATRIC: Normal mood and affect. Normal behavior. Cooperative  CARDIAC DATABASE: EKG: 02/05/2021: NSR, 72 bpm, right bundle branch block, left anterior fascicular block, without underlying injury pattern.  Echocardiogram: 01/14/2020: Normal LV systolic function with visual EF  60-65%. Left ventricle cavity is normal in size. Mild left ventricular hypertrophy.  Normal global wall motion. Normal diastolic filling pattern, normal LAP.  Trace aortic regurgitation. Mild tricuspid regurgitation. No evidence of pulmonary hypertension. No prior study for comparison.   Stress Testing: Lexiscan Tetrofosmin Stress Test  01/17/2020: Nondiagnostic ECG stress. Mild breast attenuation in the anterior wall noted.   Normal myocardial perfusion. Gated SPECT imaging of the left ventricle was normal. All segments of left ventricle demonstrated normal wall motion and  thickening. Stress LV EF is normal 59%.  No previous exam available for comparison. Low risk.   Heart Catheterization: None  7 day extended Holter monitor: Dominant rhythm normal sinus rhythm. Heart rate 53-210 bpm.  Avg HR 83 bpm. No atrial fibrillation, high grade AV block, pauses (3 seconds or longer). Total ventricular ectopic burden <1%.  Longest episode of ventricular bigeminy was 3.9 seconds.   1 episode of NSVT that was 54 beats in duration and at HR ranging between (128-158bpm).  Total supraventricular ectopic burden <1%.   7 episodes of SVT with average HR of 154 bpm and the longest episode was 6 beats in duration at a rate of 210 bpm. Patient triggered events: 0.    LABORATORY DATA: CBC Latest Ref Rng & Units 06/22/2019 09/05/2016 11/29/2015  WBC 4.0 - 10.5 K/uL 7.5 6.5 7.4  Hemoglobin 12.0 - 15.0 g/dL 15.0 14.9 14.9  Hematocrit 36.0 - 46.0 % 49.2(H) 45.5 47.1(H)  Platelets 150 - 400 K/uL 238 246 253    CMP Latest Ref Rng & Units 06/22/2019 09/05/2016 11/29/2015  Glucose 70 - 99 mg/dL 118(H) 103(H) 99  BUN 8 - 23 mg/dL 8 15 14   Creatinine 0.44 - 1.00 mg/dL 0.82 0.72 0.75  Sodium 135 - 145 mmol/L 141 138 140  Potassium 3.5 - 5.1 mmol/L 4.3 3.9 3.9  Chloride 98 - 111 mmol/L 105 106 106  CO2 22 - 32 mmol/L 28 24 27   Calcium 8.9 - 10.3 mg/dL 9.6 9.6 9.8    Lipid Panel  No results found for: CHOL, TRIG, HDL, CHOLHDL, VLDL, LDLCALC, LDLDIRECT, LABVLDL  No components found for: NTPROBNP No results for input(s): PROBNP in the last 8760 hours. No results for input(s): TSH in the last 8760 hours.  BMP No results for input(s): NA, K, CL, CO2, GLUCOSE, BUN, CREATININE, CALCIUM, GFRNONAA, GFRAA in the last 8760 hours.   HEMOGLOBIN A1C No results found for: HGBA1C, MPG   External Labs: Collected: 12/16/2018 Lipid profile: Total cholesterol 175, triglycerides 64, HDL 56, LDL 106, non HDL 119  Collected: 05/19/2018 Creatinine: 0.8 mg/dL. eGFR: 70 mL/min per 1.73  m  IMPRESSION:    ICD-10-CM   1. PSVT (paroxysmal supraventricular tachycardia) (HCC)  I47.1 EKG 12-Lead    2. RBBB (right bundle branch block with left anterior fascicular block)  I45.2     3. Hypercholesteremia  E78.00     4. Aortic atherosclerosis (HCC)  I70.0     5. Hypothyroidism, unspecified type  E03.9         RECOMMENDATIONS: Dashonda Bonneau is a 75 y.o. female whose past medical history and cardiac risk factors include: Right bundle branch block, left anterior fascicular block, hyperlipidemia, hypothyroidism, aortic atherosclerosis (06/22/2019), bilateral adrenal gland nodules (06/22/2019), postmenopausal female, advanced age.  Initially referred by her endocrinologist for evaluation of fatigue in the setting of bifascicular block and LVH.  She is undergone an ischemic evaluation as well as an echocardiogram results reviewed with her again during today's office visit and  mentioned above for further reference.  She also underwent an extended Holter monitor which did not detect evidence of high degree AV block, complete heart block or sinus pauses.  She now presents for 1 year follow-up visit.  Relatively stable from a cardiovascular standpoint.  Her biggest concern is tired and fatigue.  I believe that this is multifactorial and I have asked her to look into noncardiac causes of her symptoms such as sleep disorders that she has fragmented sleep (frequent awakening in 2 hours or less), complains of paresthesias and possible restless leg syndrome, and to have her thyroid levels rechecked given her underlying hypothyroidism, etc. will defer further work-up to primary team and endocrinology at this time.  She denies any near-syncope or syncopal event.  EKG remains relatively stable compared to 1 year ago.  Blood pressures are within acceptable range medications reconciled.  Reemphasized the importance of a low-salt diet.  Patient continues to be on statin therapy.  Does not  endorse any myalgias.  And currently managed by PCP.  I would like to see the patient back on an annual basis or sooner if change in clinical status.  FINAL MEDICATION LIST END OF ENCOUNTER: No orders of the defined types were placed in this encounter.     Current Outpatient Medications:    acetaminophen (TYLENOL) 650 MG CR tablet, Take 1,300 mg by mouth daily as needed for pain. , Disp: , Rfl:    aspirin EC 81 MG tablet, Take 81 mg by mouth daily., Disp: , Rfl:    brinzolamide (AZOPT) 1 % ophthalmic suspension, Place 1 drop into both eyes 2 (two) times daily., Disp: , Rfl:    Cyanocobalamin (VITAMIN B 12) 500 MCG TABS, Take 2 tablets by mouth daily., Disp: , Rfl:    esomeprazole (NEXIUM) 40 MG capsule, Take 40 mg by mouth daily., Disp: , Rfl: 3   famotidine (PEPCID) 20 MG tablet, 1 tablet at bedtime as needed, Disp: , Rfl:    levothyroxine (SYNTHROID, LEVOTHROID) 175 MCG tablet, Take 175 mcg by mouth See admin instructions. Take 1 tablet Monday through Friday, 1/2 tablet on Saturday, and no tablets on Sunday, Disp: , Rfl:    Probiotic Product (ALIGN PO), Take 1 tablet by mouth daily at 12 noon., Disp: , Rfl:    rosuvastatin (CRESTOR) 5 MG tablet, Take 1 tablet by mouth 2 (two) times a week. On Sunday and Wednesday, Disp: , Rfl:    Vitamin D, Ergocalciferol, (DRISDOL) 50000 units CAPS capsule, Take 50,000 Units by mouth every Friday., Disp: , Rfl:    Wheat Dextrin (BENEFIBER) POWD, See admin instructions., Disp: , Rfl:   Orders Placed This Encounter  Procedures   EKG 12-Lead    There are no Patient Instructions on file for this visit.   --Continue cardiac medications as reconciled in final medication list. --Return in about 1 year (around 02/05/2022) for Follow up conduction disease. . Or sooner if needed. --Continue follow-up with your primary care physician regarding the management of your other chronic comorbid conditions.  Patient's questions and concerns were addressed to her  satisfaction. She voices understanding of the instructions provided during this encounter.   This note was created using a voice recognition software as a result there may be grammatical errors inadvertently enclosed that do not reflect the nature of this encounter. Every attempt is made to correct such errors.  Rex Kras, [place], [facility]  Pager: [phone] Office: [phone]

## 2021-02-27 DIAGNOSIS — I4510 Unspecified right bundle-branch block: Secondary | ICD-10-CM | POA: Diagnosis not present

## 2021-02-27 DIAGNOSIS — R7303 Prediabetes: Secondary | ICD-10-CM | POA: Diagnosis not present

## 2021-02-27 DIAGNOSIS — Z0000 Encounter for general adult medical examination without abnormal findings: Secondary | ICD-10-CM | POA: Diagnosis not present

## 2021-02-27 DIAGNOSIS — M8588 Other specified disorders of bone density and structure, other site: Secondary | ICD-10-CM | POA: Diagnosis not present

## 2021-02-27 DIAGNOSIS — G8929 Other chronic pain: Secondary | ICD-10-CM | POA: Diagnosis not present

## 2021-02-27 DIAGNOSIS — G4733 Obstructive sleep apnea (adult) (pediatric): Secondary | ICD-10-CM | POA: Diagnosis not present

## 2021-02-27 DIAGNOSIS — E039 Hypothyroidism, unspecified: Secondary | ICD-10-CM | POA: Diagnosis not present

## 2021-02-27 DIAGNOSIS — I700 Atherosclerosis of aorta: Secondary | ICD-10-CM | POA: Diagnosis not present

## 2021-02-27 DIAGNOSIS — E7800 Pure hypercholesterolemia, unspecified: Secondary | ICD-10-CM | POA: Diagnosis not present

## 2021-03-05 ENCOUNTER — Other Ambulatory Visit: Payer: Self-pay | Admitting: Internal Medicine

## 2021-03-05 DIAGNOSIS — M8580 Other specified disorders of bone density and structure, unspecified site: Secondary | ICD-10-CM

## 2021-03-05 DIAGNOSIS — G4733 Obstructive sleep apnea (adult) (pediatric): Secondary | ICD-10-CM | POA: Diagnosis not present

## 2021-03-20 ENCOUNTER — Emergency Department (HOSPITAL_COMMUNITY): Payer: Medicare Other

## 2021-03-20 ENCOUNTER — Encounter (HOSPITAL_BASED_OUTPATIENT_CLINIC_OR_DEPARTMENT_OTHER): Payer: Self-pay | Admitting: Emergency Medicine

## 2021-03-20 ENCOUNTER — Other Ambulatory Visit: Payer: Self-pay

## 2021-03-20 ENCOUNTER — Emergency Department (HOSPITAL_BASED_OUTPATIENT_CLINIC_OR_DEPARTMENT_OTHER)
Admission: EM | Admit: 2021-03-20 | Discharge: 2021-03-20 | Disposition: A | Discharge status: Home / Self Care | Payer: Medicare Other | Attending: Emergency Medicine | Admitting: Emergency Medicine

## 2021-03-20 ENCOUNTER — Emergency Department (HOSPITAL_BASED_OUTPATIENT_CLINIC_OR_DEPARTMENT_OTHER): Payer: Medicare Other

## 2021-03-20 DIAGNOSIS — R079 Chest pain, unspecified: Secondary | ICD-10-CM | POA: Diagnosis not present

## 2021-03-20 DIAGNOSIS — R29818 Other symptoms and signs involving the nervous system: Secondary | ICD-10-CM | POA: Diagnosis not present

## 2021-03-20 DIAGNOSIS — Z79899 Other long term (current) drug therapy: Secondary | ICD-10-CM | POA: Diagnosis not present

## 2021-03-20 DIAGNOSIS — R2990 Unspecified symptoms and signs involving the nervous system: Secondary | ICD-10-CM

## 2021-03-20 DIAGNOSIS — R519 Headache, unspecified: Secondary | ICD-10-CM | POA: Diagnosis not present

## 2021-03-20 DIAGNOSIS — I6781 Acute cerebrovascular insufficiency: Secondary | ICD-10-CM | POA: Diagnosis not present

## 2021-03-20 DIAGNOSIS — R531 Weakness: Secondary | ICD-10-CM | POA: Diagnosis not present

## 2021-03-20 DIAGNOSIS — R29898 Other symptoms and signs involving the musculoskeletal system: Secondary | ICD-10-CM | POA: Diagnosis not present

## 2021-03-20 DIAGNOSIS — Z7982 Long term (current) use of aspirin: Secondary | ICD-10-CM | POA: Diagnosis not present

## 2021-03-20 DIAGNOSIS — E039 Hypothyroidism, unspecified: Secondary | ICD-10-CM | POA: Diagnosis not present

## 2021-03-20 LAB — BASIC METABOLIC PANEL
Anion gap: 7 (ref 5–15)
BUN: 17 mg/dL (ref 8–23)
CO2: 29 mmol/L (ref 22–32)
Calcium: 9.7 mg/dL (ref 8.9–10.3)
Chloride: 105 mmol/L (ref 98–111)
Creatinine, Ser: 0.82 mg/dL (ref 0.44–1.00)
GFR, Estimated: >60 mL/min (ref >60)
Glucose, Bld: 104 mg/dL — ABNORMAL HIGH (ref 70–99)
Potassium: 3.7 mmol/L (ref 3.5–5.1)
Sodium: 141 mmol/L (ref 135–145)

## 2021-03-20 LAB — CBC
HCT: 47.6 % — ABNORMAL HIGH (ref 36.0–46.0)
Hemoglobin: 14.9 g/dL (ref 12.0–15.0)
MCH: 27.3 pg (ref 26.0–34.0)
MCHC: 31.3 g/dL (ref 30.0–36.0)
MCV: 87.3 fL (ref 80.0–100.0)
Platelets: 233 10*3/uL (ref 150–400)
RBC: 5.45 MIL/uL — ABNORMAL HIGH (ref 3.87–5.11)
RDW: 14.5 % (ref 11.5–15.5)
WBC: 7.4 10*3/uL (ref 4.0–10.5)
nRBC: 0 % (ref 0.0–0.2)

## 2021-03-20 LAB — TROPONIN I (HIGH SENSITIVITY): Troponin I (High Sensitivity): 3 ng/L (ref <18)

## 2021-03-20 MED ORDER — LIDOCAINE 5 % EX PTCH
1.0000 | MEDICATED_PATCH | CUTANEOUS | Status: DC
  Administered 2021-03-20: 10:00:00 1 via TRANSDERMAL
  Filled 2021-03-20: qty 1

## 2021-03-20 MED ORDER — SODIUM CHLORIDE 0.9 % IV BOLUS
1000.0000 mL | Freq: Once | INTRAVENOUS | Status: AC
  Administered 2021-03-20: 10:00:00 1000 mL via INTRAVENOUS

## 2021-03-20 MED ORDER — MIDAZOLAM HCL 2 MG/2ML IJ SOLN
2.0000 mg | Freq: Once | INTRAMUSCULAR | Status: AC
  Administered 2021-03-20: 12:00:00 2 mg via INTRAVENOUS
  Filled 2021-03-20: qty 2

## 2021-03-20 MED ORDER — METHYLPREDNISOLONE 4 MG PO TBPK: ORAL_TABLET | ORAL | 0 refills | Status: DC

## 2021-03-20 MED ORDER — ASPIRIN EC 81 MG PO TBEC
81.0000 mg | DELAYED_RELEASE_TABLET | Freq: Once | ORAL | Status: AC
  Administered 2021-03-20: 10:00:00 81 mg via ORAL
  Filled 2021-03-20: qty 1

## 2021-03-20 MED ORDER — KETOROLAC TROMETHAMINE 15 MG/ML IJ SOLN
15.0000 mg | Freq: Once | INTRAMUSCULAR | Status: AC
  Administered 2021-03-20: 10:00:00 15 mg via INTRAVENOUS
  Filled 2021-03-20: qty 1

## 2021-03-20 MED ORDER — LORAZEPAM 2 MG/ML IJ SOLN: 1.0000 mg | Freq: Once | INTRAMUSCULAR | Status: AC

## 2021-03-20 MED ORDER — LORAZEPAM 2 MG/ML IJ SOLN
INTRAMUSCULAR | Status: AC
  Administered 2021-03-20: 12:00:00 1 mg via INTRAVENOUS
  Filled 2021-03-20: qty 1

## 2021-03-20 MED ORDER — TRAMADOL HCL 50 MG PO TABS: 50.0000 mg | ORAL_TABLET | Freq: Two times a day (BID) | ORAL | 0 refills | Status: DC | PRN

## 2021-03-20 NOTE — ED Notes (Signed)
S/W Marcello Moores @ [facility] for transport ed to ed [facility] 10:04

## 2021-03-20 NOTE — ED Notes (Signed)
Handoff report given to carelink and charge nurse Carlis Abbott at [facility] ED

## 2021-03-20 NOTE — ED Provider Notes (Signed)
[facility] EMERGENCY DEPT Provider Note   CSN: [id number] Arrival date & time: 03/20/21  0740     History Chief Complaint  Patient presents with   left arm    Weakness/ arm and leg    Elizabeth Barron is a 75 y.o. female.  Patient is a 75 yo female with pmh as seen below presenting for complaints of headache. Patient admits to headache described as "all over", severe, throbbing, with radiation down left neck and left upper extremity that started last night at 10:30pm. Admits to associated left upper and left lower extremity weakness. Denies prior hx of stroke. Denies speech changes or vision changes.  Denies chest pain or sob. Denies hx of migraines. Denies hx of tobacco use, hypertension, diabetes, or PVD. Admits to left calf pain on 03/18/21 that is now relieved. Denies prior hx of blood clots.   The history is provided by the patient. No language interpreter was used.      Past Medical History:  Diagnosis Date   Arthritis    knees. back   Colon polyps    GERD (gastroesophageal reflux disease)    Glaucoma    Headache    Hypercholesteremia    Hypothyroidism    thyroid removed   Insomnia    Low back pain    Obesity    Osteoarthritis    r hip, left hip   Restless leg syndrome     Patient Active Problem List   Diagnosis Date Noted   S/P lumbar spinal fusion 12/06/2015   Cough 11/28/2011    Past Surgical History:  Procedure Laterality Date   ANTERIOR CRUCIATE LIGAMENT REPAIR     BREAST LUMPECTOMY     L breast- benign   BUNIONECTOMY Left 2012   COLONOSCOPY WITH PROPOFOL N/A 10/11/2014   Procedure: COLONOSCOPY WITH PROPOFOL;  Surgeon: Garlan Fair, MD;  Location: [facility];  Service: Endoscopy;  Laterality: N/A;   KNEE SURGERY     bilateral scope surgery   MAXIMUM ACCESS (MAS)POSTERIOR LUMBAR INTERBODY FUSION (PLIF) 2 LEVEL N/A 12/06/2015   Procedure: Lumbar four-five, Lumbar five-Sacral one MAXIMUM ACCESS POSTERIOR LUMBAR INTERBODY FUSION    ;  Surgeon: Eustace Moore, MD;  Location: [place] [facility];  Service: Neurosurgery;  Laterality: N/A;   RADIOLOGY WITH ANESTHESIA Left 09/05/2016   Procedure: MRI OF LEFT HIP WITHOUT;  Surgeon: Radiologist, Medication, MD;  Location: [facility];  Service: Radiology;  Laterality: Left;   THYROIDECTOMY     TONSILLECTOMY  1955   child   TUBAL LIGATION     VAGINAL HYSTERECTOMY  1981     OB History   No obstetric history on file.     Family History  Problem Relation Age of Onset   Cancer Mother        pancreatic   Diabetes Mother    Hypertension Mother    Cancer Father        esophageal   Diabetes Father    Hypertension Father    Diabetes Sister    Other Sister        dialysis    Social History   Tobacco Use   Smoking status: Never   Smokeless tobacco: Never  Vaping Use   Vaping Use: Never used  Substance Use Topics   Alcohol use: Yes    Comment: OCCASIONAL WINE   Drug use: No    Home Medications Prior to Admission medications   Medication Sig Start Date End Date Taking? Authorizing Provider  acetaminophen (  TYLENOL) 650 MG CR tablet Take 1,300 mg by mouth daily as needed for pain.     [provider]  aspirin EC 81 MG tablet Take 81 mg by mouth daily.    [provider]  brinzolamide (AZOPT) 1 % ophthalmic suspension Place 1 drop into both eyes 2 (two) times daily.    [provider]  Cyanocobalamin (VITAMIN B 12) 500 MCG TABS Take 2 tablets by mouth daily.    [provider]  esomeprazole (NEXIUM) 40 MG capsule Take 40 mg by mouth daily. 11/14/15   [provider]  famotidine (PEPCID) 20 MG tablet 1 tablet at bedtime as needed 07/09/19   [provider]  levothyroxine (SYNTHROID, LEVOTHROID) 175 MCG tablet Take 175 mcg by mouth See admin instructions. Take 1 tablet Monday through Friday, 1/2 tablet on Saturday, and no tablets on Sunday 10/22/11   [provider]  Probiotic Product (ALIGN PO) Take 1 tablet by mouth  daily at 12 noon.    [provider]  rosuvastatin (CRESTOR) 5 MG tablet Take 1 tablet by mouth 2 (two) times a week. On Sunday and Wednesday 01/09/19   [provider]  Vitamin D, Ergocalciferol, (DRISDOL) 50000 units CAPS capsule Take 50,000 Units by mouth every Friday.    [provider]  Wheat Dextrin (BENEFIBER) POWD See admin instructions.    [provider]    Allergies    Patient has no known allergies.  Review of Systems   Review of Systems  Constitutional:  Negative for chills and fever.  HENT:  Negative for ear pain and sore throat.   Eyes:  Negative for pain and visual disturbance.  Respiratory:  Negative for cough and shortness of breath.   Cardiovascular:  Negative for chest pain and palpitations.  Gastrointestinal:  Negative for abdominal pain and vomiting.  Genitourinary:  Negative for dysuria and hematuria.  Musculoskeletal:  Negative for arthralgias and back pain.  Skin:  Negative for color change and rash.  Neurological:  Positive for weakness and headaches. Negative for seizures and syncope.  All other systems reviewed and are negative.  Physical Exam Updated Vital Signs BP (!) 159/86    Pulse 72    Temp 98.5 F (36.9 C) (Oral)    Resp (!) 23    SpO2 98%   Physical Exam Vitals and nursing note reviewed.  Constitutional:      General: She is not in acute distress.    Appearance: She is well-developed.  HENT:     Head: Normocephalic and atraumatic.  Eyes:     General: Lids are normal. Vision grossly intact.     Conjunctiva/sclera: Conjunctivae normal.     Pupils: Pupils are equal, round, and reactive to light.     Visual Fields: Right eye visual fields normal and left eye visual fields normal.  Cardiovascular:     Rate and Rhythm: Normal rate and regular rhythm.     Heart sounds: No murmur heard. Pulmonary:     Effort: Pulmonary effort is normal. No respiratory distress.     Breath sounds: Normal breath sounds.   Abdominal:     Palpations: Abdomen is soft.     Tenderness: There is no abdominal tenderness.  Musculoskeletal:        General: No swelling.     Cervical back: Neck supple.  Skin:    General: Skin is warm and dry.     Capillary Refill: Capillary refill takes less than 2 seconds.  Neurological:  Mental Status: She is alert and oriented to person, place, and time.     GCS: GCS eye subscore is 4. GCS verbal subscore is 5. GCS motor subscore is 6.     Cranial Nerves: Cranial nerves 2-12 are intact.     Sensory: Sensation is intact.     Motor: Weakness present.     Coordination: Coordination is intact.  Psychiatric:        Mood and Affect: Mood normal.    ED Results / Procedures / Treatments   Labs (all labs ordered are listed, but only abnormal results are displayed) Labs Reviewed  CBC - Abnormal; Notable for the following components:      Result Value   RBC 5.45 (*)    HCT 47.6 (*)    All other components within normal limits  BASIC METABOLIC PANEL  TROPONIN I (HIGH SENSITIVITY)    EKG None  Radiology No results found.  Procedures Procedures   Medications Ordered in ED Medications - No data to display  ED Course  I have reviewed the triage vital signs and the nursing notes.  Pertinent labs & imaging results that were available during my care of the patient were reviewed by me and considered in my medical decision making (see chart for details).    MDM Rules/Calculators/A&P                         8:17 AM 75 yo female presenting for headache with radiation to left neck and left arm. Associated with left upper extremity and left lower extremity weakness.   Symptom onset: 10:30 pm 03/19/2021 NIH stroke scale: 2 Risk factors: Denies hx of HTN, DM, Tobacco use, PVD, or prior strokes  CT head non contrast demonstrates: No acute process. No hemorrhage. 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  No associated edema. Chronic right sphenoid sinusitis.  Patient  will not receive TPA for the following reason(s): Did not meet the time window for tPA. Thorough discussion had with patient and/or family/guardian on risks/benefits of tPA and why patient is not a candidate for thrombolytic therapy at this time. tPA will not be given to this patient. ASA given.   Recommended MRI. Patient to [facility]. Accepting ED physician Dr. Ronnald Nian.    Final Clinical Impression(s) / ED Diagnoses Final diagnoses:  Stroke-like symptom  Nonintractable headache, unspecified chronicity pattern, unspecified headache type  Weakness    Rx / DC Orders ED Discharge Orders     None        Lianne Cure, DO 33/38/32 [phone]

## 2021-03-20 NOTE — Discharge Instructions (Addendum)
At this time there does not appear to be the presence of an emergent medical condition, however there is always the potential for conditions to change. Please read and follow the below instructions.  Please return to the Emergency Department immediately for any new or worsening symptoms. Please be sure to follow up with your Primary Care Provider within one week regarding your visit today; please call their office to schedule an appointment even if you are feeling better for a follow-up visit. Please take the medication Medrol as prescribed to help with your symptoms.  You may use the pain medication tramadol as prescribed to help with your symptoms.  Do not drink alcohol or drive a vehicle after taking tramadol as will make you drowsy.  Do not take any other sedating medications with tramadol as it will worsen side effects and be dangerous. Please call Dr. Raeford Razor under discharge paperwork for further evaluation treatment of your pain.  Go to the nearest Emergency Department immediately if: You have fever or chills Have sudden weakness on one side of your face or body. Have chest pain. Have trouble breathing or shortness of breath. Have problems with your vision. Have trouble talking or swallowing. Have trouble standing or walking. Are light-headed. Pass out (lose consciousness). You have any new/concerning or worsening of symptoms   Please read the additional information packets attached to your discharge summary.  Do not take your medicine if  develop an itchy rash, swelling in your mouth or lips, or difficulty breathing; call 911 and seek immediate emergency medical attention if this occurs.  You may review your lab tests and imaging results in their entirety on your MyChart account.  Please discuss all results of fully with your primary care provider and other specialist at your follow-up visit.  Note: Portions of this text may have been transcribed using voice recognition software.  Every effort was made to ensure accuracy; however, inadvertent computerized transcription errors may still be present.

## 2021-03-20 NOTE — ED Triage Notes (Signed)
Pt here from [place] for L sided weakness and HA x 2 days. Here for MRI

## 2021-03-20 NOTE — ED Notes (Signed)
Patient transported to MRI 

## 2021-03-20 NOTE — ED Triage Notes (Signed)
Pt has had left arm/left leg weakness for 2 days. Pt started with headache last night. Pt  reports bp was 175/115 at 0530.

## 2021-03-20 NOTE — ED Provider Notes (Signed)
75 year old female arrived as transfer for MRI of the brain to evaluate for cause of left upper extremity left lower extremity weakness.  Patient has been experiencing pain rating from her head down her left neck and arm x2 days.  CT Noncon head showed no acute process but a likely meningioma in the falx.  Patient did not meet tPA criteria and no MRI available at the outside hospital.  Patient was sent over for MRI of the brain.    Physical Exam  BP (!) 115/100    Pulse 94    Temp 98.6 F (37 C) (Oral)    Resp 19    Ht 5' 2.5" (1.588 m)    Wt 103.9 kg    SpO2 99%    BMI 41.22 kg/m   Physical Exam Constitutional:      General: She is not in acute distress.    Appearance: Normal appearance. She is well-developed. She is not ill-appearing or diaphoretic.  HENT:     Head: Normocephalic and atraumatic.  Eyes:     General: Vision grossly intact. Gaze aligned appropriately.     Pupils: Pupils are equal, round, and reactive to light.  Neck:     Trachea: Trachea and phonation normal.   Cardiovascular:     Rate and Rhythm: Normal rate and regular rhythm.     Pulses:          Radial pulses are 2+ on the right side and 2+ on the left side.       Dorsalis pedis pulses are 2+ on the right side and 2+ on the left side.  Pulmonary:     Effort: Pulmonary effort is normal. No respiratory distress.  Abdominal:     General: There is no distension.     Palpations: Abdomen is soft.     Tenderness: There is no abdominal tenderness. There is no guarding or rebound.  Musculoskeletal:        General: Normal range of motion.     Cervical back: Normal range of motion. Muscular tenderness present. No spinous process tenderness.  Skin:    General: Skin is warm and dry.  Neurological:     Mental Status: She is alert.     GCS: GCS eye subscore is 4. GCS verbal subscore is 5. GCS motor subscore is 6.     Comments: Speech is clear and goal oriented, follows commands Major Cranial nerves without deficit, no  facial droop Moves extremities without ataxia, coordination intact 4/5 strength with movements of the left upper extremity increased pain with all movements  Psychiatric:        Behavior: Behavior normal.    ED Course/Procedures     Procedures  CT Head:  IMPRESSION:  No acute intracranial abnormality.     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  No associated edema.     Chronic right sphenoid sinusitis.   CXR:  IMPRESSION:  Very low lung volumes. No acute findings.   EKG: Shows no acute ischemic changes confirmed by Dr. Ronnald Nian.  CBC without leukocytosis, anemia or thrombocytopenia. BMP shows no emergent Electra derangement, AKI or gap. Troponin within normal limits.  MDM  MRI Brain:  IMPRESSION:  1. No acute intracranial pathology.  2. Probable meningioma again seen along the left aspect of the falx  anteriorly.    MRI reassuring today.  On my evaluation patient reports her headache is resolved.  She has some tenderness of the left trapezius muscle and left deltoid, question if patient  is experiencing some level of radiculopathy.  She denied any history concerning for cauda equina, negative Hoffmann sign.  Low suspicion for myelopathy at this point. --------------------------------- Patient seen and evaluated by Dr. Ronnald Nian, it appears patient has been experiencing radicular symptoms to her left leg for some time.  May have developed some radicular symptoms to the left arm as well.  Recommendation Medrol Dosepak, follow-up with Dr. Rosiland Oz, tramadol for pain control.  Low suspicion for meningitis or other emergent cause of patient's symptoms today, vital signs are reassuring, no meningismus, no headache or vision changes.  Additionally with negative troponin and reassuring EKG low suspicion for cardiopulmonary cause of left shoulder/neck pain.  On reassessment patient well-appearing no acute distress sitting up in bed.    At this time there does not appear to be  any evidence of an acute emergency medical condition and the patient appears stable for discharge with appropriate outpatient follow up. Diagnosis was discussed with patient who verbalizes understanding of care plan and is agreeable to discharge. I have discussed return precautions with patient who verbalizes understanding. Patient encouraged to follow-up with their PCP. All questions answered.  Patient's case discussed with Dr. Ronnald Nian who agrees with plan to discharge with follow-up.    Patient denies history diabetes or adverse reaction to steroids in the past.  PMD P reviewed, no narcotic prescriptions x6 months.  I discussed narcotic precautions with the patient regarding tramadol and she stated understanding.  Note: Portions of this report may have been transcribed using voice recognition software. Every effort was made to ensure accuracy; however, inadvertent computerized transcription errors may still be present.    Deliah Boston, PA-C 03/20/21 [place], [place], DO 03/20/21 1605

## 2021-03-20 NOTE — ED Notes (Signed)
Pt refused MRI even after Ativan was given in her IV because she was still "awake & aware" EDP informed.

## 2021-03-20 NOTE — ED Notes (Signed)
[facility] at bedside 

## 2021-03-20 NOTE — ED Notes (Signed)
ED Provider at bedside. 

## 2021-03-28 ENCOUNTER — Ambulatory Visit (INDEPENDENT_AMBULATORY_CARE_PROVIDER_SITE_OTHER): Payer: Medicare Other | Admitting: Family Medicine

## 2021-03-28 ENCOUNTER — Ambulatory Visit (HOSPITAL_BASED_OUTPATIENT_CLINIC_OR_DEPARTMENT_OTHER)
Admission: RE | Admit: 2021-03-28 | Discharge: 2021-03-28 | Disposition: A | Discharge status: Home / Self Care | Payer: Medicare Other | Source: Ambulatory Visit | Attending: Family Medicine | Admitting: Family Medicine

## 2021-03-28 ENCOUNTER — Encounter: Payer: Self-pay | Admitting: Family Medicine

## 2021-03-28 ENCOUNTER — Other Ambulatory Visit: Payer: Self-pay

## 2021-03-28 VITALS — BP 140/90 | Ht 62.5 in | Wt 229.0 lb

## 2021-03-28 DIAGNOSIS — M5412 Radiculopathy, cervical region: Secondary | ICD-10-CM | POA: Diagnosis not present

## 2021-03-28 DIAGNOSIS — M542 Cervicalgia: Secondary | ICD-10-CM | POA: Diagnosis not present

## 2021-03-28 MED ORDER — HYDROCODONE-ACETAMINOPHEN 5-325 MG PO TABS: 1.0000 | ORAL_TABLET | Freq: Three times a day (TID) | ORAL | 0 refills | Status: DC | PRN

## 2021-03-28 NOTE — Assessment & Plan Note (Signed)
Pain resembles radiculopathy at the cervical spine.  Less likely to be related to the rotator cuff on the left shoulder. -Counseled on home exercise therapy and supportive care. -Counseled on Norco. -Counseled on Lyrica and weaning off if no improvement. -X-ray of the cervical spine. -Referral to physical therapy. -Could consider injection or further imaging.

## 2021-03-28 NOTE — Progress Notes (Signed)
Elizabeth Barron - 76 y.o. female MRN [id number]  Date of birth: 1946/01/01  SUBJECTIVE:  Including CC & ROS.  No chief complaint on file.   Elizabeth Barron is a 76 y.o. female that is presenting with acute left arm and neck pain.  The pain is been ongoing for a few weeks.  The pain is severe and constant.  No history of similar pain.  It starts at the neck and radiates down to the left arm.  She does feel weakness in the left arm..  Review of the CT head from 12/27 shows no acute intracranial abnormality.  Review of the MRI brain from 12/27 shows no acute changes   Review of Systems See HPI   HISTORY: Past Medical, Surgical, Social, and Family History Reviewed & Updated per EMR.   Pertinent Historical Findings include:  Past Medical History:  Diagnosis Date   Arthritis    knees. back   Colon polyps    GERD (gastroesophageal reflux disease)    Glaucoma    Headache    Hypercholesteremia    Hypothyroidism    thyroid removed   Insomnia    Low back pain    Obesity    Osteoarthritis    r hip, left hip   Restless leg syndrome     Past Surgical History:  Procedure Laterality Date   ANTERIOR CRUCIATE LIGAMENT REPAIR     BREAST LUMPECTOMY     L breast- benign   BUNIONECTOMY Left 2012   COLONOSCOPY WITH PROPOFOL N/A 10/11/2014   Procedure: COLONOSCOPY WITH PROPOFOL;  Surgeon: Garlan Fair, MD;  Location: [facility];  Service: Endoscopy;  Laterality: N/A;   KNEE SURGERY     bilateral scope surgery   MAXIMUM ACCESS (MAS)POSTERIOR LUMBAR INTERBODY FUSION (PLIF) 2 LEVEL N/A 12/06/2015   Procedure: Lumbar four-five, Lumbar five-Sacral one MAXIMUM ACCESS POSTERIOR LUMBAR INTERBODY FUSION   ;  Surgeon: Eustace Moore, MD;  Location: [place] [facility];  Service: Neurosurgery;  Laterality: N/A;   RADIOLOGY WITH ANESTHESIA Left 09/05/2016   Procedure: MRI OF LEFT HIP WITHOUT;  Surgeon: Radiologist, Medication, MD;  Location: [place];  Service: Radiology;  Laterality: Left;    THYROIDECTOMY     TONSILLECTOMY  1955   child   TUBAL LIGATION     VAGINAL HYSTERECTOMY  1981    Family History  Problem Relation Age of Onset   Cancer Mother        pancreatic   Diabetes Mother    Hypertension Mother    Cancer Father        esophageal   Diabetes Father    Hypertension Father    Diabetes Sister    Other Sister        dialysis    Social History   Socioeconomic History   Marital status: Married    Spouse name: Not on file   Number of children: 1   Years of education: Not on file   Highest education level: High school graduate  Occupational History    Employer: RETIRED  Tobacco Use   Smoking status: Never   Smokeless tobacco: Never  Vaping Use   Vaping Use: Never used  Substance and Sexual Activity   Alcohol use: Yes    Comment: OCCASIONAL WINE   Drug use: No   Sexual activity: Not on file  Other Topics Concern   Not on file  Social History Narrative   Lives with souse   caffeine none   Social Determinants of  Health   Financial Resource Strain: Not on file  Food Insecurity: Not on file  Transportation Needs: Not on file  Physical Activity: Not on file  Stress: Not on file  Social Connections: Not on file  Intimate Partner Violence: Not on file     PHYSICAL EXAM:  VS: BP 140/90 (BP Location: Right Arm, Patient Position: Sitting)    Ht 5' 2.5" (1.588 m)    Wt 229 lb (103.9 kg)    BMI 41.22 kg/m  Physical Exam Gen: NAD, alert, cooperative with exam, well-appearing   ASSESSMENT & PLAN:   Cervical radiculopathy Pain resembles radiculopathy at the cervical spine.  Less likely to be related to the rotator cuff on the left shoulder. -Counseled on home exercise therapy and supportive care. -Counseled on Norco. -Counseled on Lyrica and weaning off if no improvement. -X-ray of the cervical spine. -Referral to physical therapy. -Could consider injection or further imaging.

## 2021-03-28 NOTE — Patient Instructions (Signed)
Nice to meet you  Please try heat on the area  Please try the exercises  Please try physical therapy  Please use the pain medicine as needed  Please do not take more than 3 grams of tylenol per day  You can wean off of the lyrica if it's not working Please send me a message in [place] with any questions or updates.  Please see me back in 2-3 weeks.   --Dr. Raeford Razor

## 2021-03-29 DIAGNOSIS — G8929 Other chronic pain: Secondary | ICD-10-CM | POA: Diagnosis not present

## 2021-03-29 DIAGNOSIS — M5442 Lumbago with sciatica, left side: Secondary | ICD-10-CM | POA: Diagnosis not present

## 2021-03-29 DIAGNOSIS — R350 Frequency of micturition: Secondary | ICD-10-CM | POA: Diagnosis not present

## 2021-03-30 ENCOUNTER — Telehealth: Payer: Self-pay | Admitting: Family Medicine

## 2021-03-30 NOTE — Telephone Encounter (Signed)
Informed of results.   Rosemarie Ax, MD [facility] 03/30/2021, 1:28 PM

## 2021-04-04 ENCOUNTER — Other Ambulatory Visit: Payer: Self-pay | Admitting: Internal Medicine

## 2021-04-04 DIAGNOSIS — Z1231 Encounter for screening mammogram for malignant neoplasm of breast: Secondary | ICD-10-CM

## 2021-04-04 DIAGNOSIS — R300 Dysuria: Secondary | ICD-10-CM | POA: Diagnosis not present

## 2021-04-11 ENCOUNTER — Other Ambulatory Visit: Payer: Self-pay

## 2021-04-11 ENCOUNTER — Ambulatory Visit: Payer: Medicare Other | Attending: Family Medicine

## 2021-04-11 DIAGNOSIS — M5412 Radiculopathy, cervical region: Secondary | ICD-10-CM | POA: Diagnosis not present

## 2021-04-11 DIAGNOSIS — M25512 Pain in left shoulder: Secondary | ICD-10-CM | POA: Insufficient documentation

## 2021-04-11 DIAGNOSIS — R2689 Other abnormalities of gait and mobility: Secondary | ICD-10-CM | POA: Diagnosis not present

## 2021-04-11 DIAGNOSIS — M542 Cervicalgia: Secondary | ICD-10-CM | POA: Diagnosis not present

## 2021-04-11 DIAGNOSIS — M6281 Muscle weakness (generalized): Secondary | ICD-10-CM | POA: Diagnosis not present

## 2021-04-11 NOTE — Therapy (Signed)
OUTPATIENT PHYSICAL THERAPY CERVICAL EVALUATION   Patient Name: Elizabeth Barron MRN: [id number] DOB:03/11/1946, 76 y.o., female Today's Date: 04/11/2021   PT End of Session - 04/11/21 1059     Visit Number 1    Number of Visits 25    Date for PT Re-Evaluation 07/04/21    Authorization Type [employer] Medicare - FOTO 6th and 10th    PT Start Time 1100    PT Stop Time 1150    PT Time Calculation (min) 50 min    Activity Tolerance Patient limited by pain    Behavior During Therapy [facility] for tasks assessed/performed             Past Medical History:  Diagnosis Date   Arthritis    knees. back   Colon polyps    GERD (gastroesophageal reflux disease)    Glaucoma    Headache    Hypercholesteremia    Hypothyroidism    thyroid removed   Insomnia    Low back pain    Obesity    Osteoarthritis    r hip, left hip   Restless leg syndrome    Past Surgical History:  Procedure Laterality Date   ANTERIOR CRUCIATE LIGAMENT REPAIR     BREAST LUMPECTOMY     L breast- benign   BUNIONECTOMY Left 2012   COLONOSCOPY WITH PROPOFOL N/A 10/11/2014   Procedure: COLONOSCOPY WITH PROPOFOL;  Surgeon: Garlan Fair, MD;  Location: [facility];  Service: Endoscopy;  Laterality: N/A;   KNEE SURGERY     bilateral scope surgery   MAXIMUM ACCESS (MAS)POSTERIOR LUMBAR INTERBODY FUSION (PLIF) 2 LEVEL N/A 12/06/2015   Procedure: Lumbar four-five, Lumbar five-Sacral one MAXIMUM ACCESS POSTERIOR LUMBAR INTERBODY FUSION   ;  Surgeon: Eustace Moore, MD;  Location: [place] [facility];  Service: Neurosurgery;  Laterality: N/A;   RADIOLOGY WITH ANESTHESIA Left 09/05/2016   Procedure: MRI OF LEFT HIP WITHOUT;  Surgeon: Radiologist, Medication, MD;  Location: [place];  Service: Radiology;  Laterality: Left;   THYROIDECTOMY     TONSILLECTOMY  1955   child   TUBAL LIGATION     VAGINAL HYSTERECTOMY  1981   Patient Active Problem List   Diagnosis Date Noted   Cervical radiculopathy 03/28/2021   S/P lumbar spinal  fusion 12/06/2015   Cough 11/28/2011    PCP: Seward Carol, MD  REFERRING PROVIDER: Rosemarie Ax, MD  REFERRING DIAG: [phone] (ICD-10-CM) - Cervical radiculopathy  THERAPY DIAG:  Cervicalgia  Acute pain of left shoulder  Muscle weakness (generalized)  Other abnormalities of gait and mobility  ONSET DATE: December 2022  SUBJECTIVE:  SUBJECTIVE STATEMENT: Pt presents to PT with reports of fairly acute neck pain that radiates into L UE. Denies MOI, had gradual onset that sent her to the ED on 03/20/22. Pt is Lt hand dominant and has had limitation in functional ability. Denies paresthesias down L UE, also denies b/b changes or saddle anesthesia. She also c/o chronic LBP and has past history of lumbar fusion, would like to address this with physical therapy as well.   PERTINENT HISTORY:  Recent onset of neck and L UE pain; also reports chronic lower back pain and balance deficits  PAIN:  Are you having pain? Yes NPRS scale: 5/10 (8/10) Pain location: L side of neck, L UE PAIN TYPE: aching Pain description: intermittent  Aggravating factors: reaching, driving Relieving factors: heat  PRECAUTIONS: None  WEIGHT BEARING RESTRICTIONS No  FALLS:  Has patient fallen in last 6 months? No Number of falls: N/A  LIVING ENVIRONMENT: Lives with: lives with their family Lives in: House/apartment Stairs: Yes; Internal: 6 steps; can reach both Has following equipment at home: Single point cane and Walker - 4 wheeled  OCCUPATION: Retired - used to work HR at [employer]  PLOF: Independent and Independent with basic ADLs  PATIENT GOALS: Pt wants to decrease pain  OBJECTIVE:   DIAGNOSTIC FINDINGS:  CLINICAL DATA:  Neck pain.   EXAM: CERVICAL SPINE - 2-3 VIEW   COMPARISON:  None.    FINDINGS: There is no acute fracture or subluxation of the cervical spine. Multilevel degenerative changes with disc space narrowing and endplate irregularity and spurring. The visualized posterior elements and odontoid appear intact. There is anatomic alignment of the lateral masses of C1 and C2. The soft tissues are unremarkable. Multiple surgical clips noted over the neck.   IMPRESSION: No acute/traumatic cervical spine pathology.   PATIENT SURVEYS:  FOTO 59% function; 67% predicted  COGNITION: Overall cognitive status: Within functional limits for tasks assessed   SENSATION: Light touch: Appears intact  POSTURE:  Medium body habitus; rounded shoulders, fwd head - difficulty with lying supine  PALPATION: TTP to L upper trap, L cervical paraspinals   CERVICAL AROM/PROM  A/PROM A/PROM (deg) 04/11/2021  Flexion   Extension   Right lateral flexion   Left lateral flexion   Right rotation 48  Left rotation 60   (Blank rows = not tested)  UE AROM/PROM:  A/PROM Right 04/11/2021 Left 04/11/2021  Shoulder flexion  150  Shoulder extension    Shoulder abduction  80 p!  Shoulder adduction    Shoulder extension    Shoulder internal rotation    Shoulder external rotation    Elbow flexion    Elbow extension    Wrist flexion    Wrist extension    Wrist ulnar deviation    Wrist radial deviation    Wrist pronation    Wrist supination     (Blank rows = not tested)  UPPER EXTREMITY MMT:  MMT Right 04/11/2021 Left 04/11/2021  Shoulder flexion 5/5 3/5  Shoulder abduction (C5)  2+/5  Shoulder ER    Shoulder IR    Middle trapezius    Lower trapezius    Shoulder extension    Grip strength 10# 20#   (Blank rows = not tested, score listed is out of 5 possible points.  N = WNL, D = diminished, C = clear for gross weakness with myotome testing, * = concordant pain with testing)   CERVICAL SPECIAL TESTS:  Upper limb tension test (ULTT): Negative and Spurling's test:  Negative   FUNCTIONAL TESTS:  N/A   TODAY'S TREATMENT:  [facility] Adult PT Treatment:                                                DATE: 04/11/2021 Therapeutic Exercise: L upper trap stretch x 30" Seated scapular retraction x 10 - 3" hold Supine chin tuck x 5 - 5" Manual Therapy: Suboccipital release  Positional release L upper trap Neuromuscular re-ed: N/A Therapeutic Activity: N/A Modalities: N/A Self Care: N/A    PATIENT EDUCATION:  Education details: eval findings, FOTO, HEP, POC Person educated: Patient Education method: Explanation, Demonstration, and Handouts Education comprehension: verbalized understanding and returned demonstration  HOME EXERCISE PROGRAM: Access Code: [email or web address]  ASSESSMENT:  CLINICAL IMPRESSION: Patient is a 76 y.o. F who was seen today for physical therapy evaluation and treatment for acute onset of L sided neck and L UE pain and discomfort. Objective impairments include Abnormal gait, decreased activity tolerance, decreased balance, decreased ROM, decreased strength, and pain. These impairments are limiting patient from cleaning and community activity. Personal factors including Age, Fitness, and Time since onset of injury/illness/exacerbation are also affecting patient's functional outcome. Due to s/s, unable to rule out the possibility of cervical radiculopathy affecting pt's neck and L UE. Patient will benefit from skilled PT to address above impairments and improve overall function.  REHAB POTENTIAL: Good  CLINICAL DECISION MAKING: Evolving/moderate complexity  EVALUATION COMPLEXITY: Moderate   GOALS: Goals reviewed with patient? No  SHORT TERM GOALS:  STG Name Target Date Goal status  1 Pt will be compliant and knowledgeable with initial HEP for improved comfort and carryover Baseline: initial HEP given 05/02/2021 INITIAL  2 Pt will self report neck and L UE pain no greater than 6/10 for improved comfort and functional  ability Baseline: 8/10 at worst 05/02/2021 INITIAL   LONG TERM GOALS:   LTG Name Target Date Goal status  1 Pt will improve FOTO function score to no less than 67% as proxy for functional improvement Baseline: 59% function 07/04/2021 INITIAL  2 Pt will self report neck and L UE pain no greater than 2/10 for improved comfort and functional ability Baseline: 8/10 at worst 07/04/2021 INITIAL  3 Pt will improve R cervical rotation to no less than 60 deg for improved functional ability Baseline: 48 deg 07/04/2021 INITIAL   PLAN: PT FREQUENCY: 2x/week  PT DURATION: 12 weeks  PLANNED INTERVENTIONS: Therapeutic exercises, Therapeutic activity, Neuro Muscular re-education, Balance training, Gait training, Patient/Family education, Joint mobilization, Aquatic Therapy, Dry Needling, Cryotherapy, Moist heat, and Manual therapy  PLAN FOR NEXT SESSION: assess response to HEP; progress periscapular and DNF exercises   Ward Chatters 04/11/2021, 1:54 PM

## 2021-04-16 ENCOUNTER — Other Ambulatory Visit: Payer: Self-pay

## 2021-04-16 ENCOUNTER — Ambulatory Visit: Payer: Medicare Other | Admitting: Physical Therapy

## 2021-04-16 ENCOUNTER — Encounter: Payer: Self-pay | Admitting: Physical Therapy

## 2021-04-16 DIAGNOSIS — M5412 Radiculopathy, cervical region: Secondary | ICD-10-CM | POA: Diagnosis not present

## 2021-04-16 DIAGNOSIS — M25512 Pain in left shoulder: Secondary | ICD-10-CM | POA: Diagnosis not present

## 2021-04-16 DIAGNOSIS — M542 Cervicalgia: Secondary | ICD-10-CM

## 2021-04-16 DIAGNOSIS — M6281 Muscle weakness (generalized): Secondary | ICD-10-CM | POA: Diagnosis not present

## 2021-04-16 DIAGNOSIS — R2689 Other abnormalities of gait and mobility: Secondary | ICD-10-CM

## 2021-04-16 NOTE — Therapy (Signed)
OUTPATIENT PHYSICAL THERAPY TREATMENT NOTE   Patient Name: Elizabeth Barron MRN: [id number] DOB:[birth year], 76 y.o., female Today's Date: 04/16/2021  PCP: Seward Carol, MD REFERRING PROVIDER: Seward Carol, MD   PT End of Session - 04/16/21 1105     Visit Number 2    Number of Visits 25    Date for PT Re-Evaluation 07/04/21    Authorization Type [employer] Medicare - FOTO 6th and 10th    PT Start Time 1101    PT Stop Time 1150    PT Time Calculation (min) 49 min             Past Medical History:  Diagnosis Date   Arthritis    knees. back   Colon polyps    GERD (gastroesophageal reflux disease)    Glaucoma    Headache    Hypercholesteremia    Hypothyroidism    thyroid removed   Insomnia    Low back pain    Obesity    Osteoarthritis    r hip, left hip   Restless leg syndrome    Past Surgical History:  Procedure Laterality Date   ANTERIOR CRUCIATE LIGAMENT REPAIR     BREAST LUMPECTOMY     L breast- benign   BUNIONECTOMY Left 2012   COLONOSCOPY WITH PROPOFOL N/A 10/11/2014   Procedure: COLONOSCOPY WITH PROPOFOL;  Surgeon: Garlan Fair, MD;  Location: [facility];  Service: Endoscopy;  Laterality: N/A;   KNEE SURGERY     bilateral scope surgery   MAXIMUM ACCESS (MAS)POSTERIOR LUMBAR INTERBODY FUSION (PLIF) 2 LEVEL N/A 12/06/2015   Procedure: Lumbar four-five, Lumbar five-Sacral one MAXIMUM ACCESS POSTERIOR LUMBAR INTERBODY FUSION   ;  Surgeon: Eustace Moore, MD;  Location: [facility];  Service: Neurosurgery;  Laterality: N/A;   RADIOLOGY WITH ANESTHESIA Left 09/05/2016   Procedure: MRI OF LEFT HIP WITHOUT;  Surgeon: Radiologist, Medication, MD;  Location: [place];  Service: Radiology;  Laterality: Left;   THYROIDECTOMY     TONSILLECTOMY  1955   child   TUBAL LIGATION     VAGINAL HYSTERECTOMY  1981   Patient Active Problem List   Diagnosis Date Noted   Cervical radiculopathy 03/28/2021   S/P lumbar spinal fusion 12/06/2015   Cough 11/28/2011  PCP:  Seward Carol, MD   REFERRING PROVIDER: Rosemarie Ax, MD   REFERRING DIAG: [phone] (ICD-10-CM) - Cervical radiculopathy  THERAPY DIAG:  Cervicalgia  Acute pain of left shoulder  Muscle weakness (generalized)  Other abnormalities of gait and mobility  ONSET DATE: December 2022   PERTINENT HISTORY:  Recent onset of neck and L UE pain; also reports chronic lower back pain and balance deficits  PRECAUTIONS: None   WEIGHT BEARING RESTRICTIONS No  SUBJECTIVE: Pt reports compliance with HEP and has questions with chin tuck.   PAIN:  Are you having pain? Yes NPRS scale: 0/10, 7/10 when up and active Pain location: L side of neck, L UE PAIN TYPE: aching Pain description: intermittent  Aggravating factors: reaching, driving Relieving factors: heat     (OBJECTIVE:    DIAGNOSTIC FINDINGS:  CLINICAL DATA:  Neck pain.   EXAM: CERVICAL SPINE - 2-3 VIEW   COMPARISON:  None.   FINDINGS: There is no acute fracture or subluxation of the cervical spine. Multilevel degenerative changes with disc space narrowing and endplate irregularity and spurring. The visualized posterior elements and odontoid appear intact. There is anatomic alignment of the lateral masses of C1 and C2. The soft tissues are unremarkable.  Multiple surgical clips noted over the neck.   IMPRESSION: No acute/traumatic cervical spine pathology.    PATIENT SURVEYS:  FOTO 59% function; 67% predicted   COGNITION: Overall cognitive status: Within functional limits for tasks assessed            SENSATION: Light touch: Appears intact   POSTURE:  Medium body habitus; rounded shoulders, fwd head - difficulty with lying supine   PALPATION: TTP to L upper trap, L cervical paraspinals          CERVICAL AROM/PROM   A/PROM A/PROM (deg) 04/11/2021  Flexion    Extension    Right lateral flexion    Left lateral flexion    Right rotation 48  Left rotation 60   (Blank rows = not tested)   UE  AROM/PROM:   A/PROM Right 04/11/2021 Left 04/11/2021  Shoulder flexion   150  Shoulder extension      Shoulder abduction   80 p!  Shoulder adduction      Shoulder extension      Shoulder internal rotation      Shoulder external rotation      Elbow flexion      Elbow extension      Wrist flexion      Wrist extension      Wrist ulnar deviation      Wrist radial deviation      Wrist pronation      Wrist supination       (Blank rows = not tested)   UPPER EXTREMITY MMT:   MMT Right 04/11/2021 Left 04/11/2021  Shoulder flexion 5/5 3/5  Shoulder abduction (C5)   2+/5  Shoulder ER      Shoulder IR      Middle trapezius      Lower trapezius      Shoulder extension      Grip strength 10# 20#    (Blank rows = not tested, score listed is out of 5 possible points.  N = WNL, D = diminished, C = clear for gross weakness with myotome testing, * = concordant pain with testing)    CERVICAL SPECIAL TESTS:  Upper limb tension test (ULTT): Negative and Spurling's test: Negative     FUNCTIONAL TESTS:  N/A     TODAY'S TREATMENT:    [facility] Adult PT Treatment:                                                DATE: 04/16/2021 Therapeutic Exercise: R/L upper trap stretch 3 x 30" R/L levator stretch Seated scapular retraction x 10 - 3" hold Seated chin tuck 5 sec x 10 Seated vs standing row red band 10 reps 3 sets Supine chin tuck x 10- 5"  Manual Therapy: Manual cervical traction Positional release L/R upper trap Passive levator stretch Trigger point release (manual) x 2 left upper trap  Neuromuscular re-ed: N/A Therapeutic Activity: N/A Modalities: HMP x 10 min, cervical  Self Care: N/A   [facility] Adult PT Treatment:                                                DATE: 04/11/2021 Therapeutic Exercise: L upper trap stretch x 30" Seated scapular retraction x 10 -  3" hold Supine chin tuck x 5 - 5" Manual Therapy: Suboccipital release  Positional release L upper  trap Neuromuscular re-ed: N/A Therapeutic Activity: N/A Modalities: N/A Self Care: N/A       PATIENT EDUCATION:  Education details: updated  HEP Person educated: Patient Education method: Explanation, Demonstration, and Handouts Education comprehension: verbalized understanding and returned demonstration   HOME EXERCISE PROGRAM: Access Code: [email or web address] URL: [email or web address] Date: 04/16/2021 Prepared by: Hessie Diener  Exercises Seated Upper Trapezius Stretch (Mirrored) - 2 x daily - 7 x weekly - 3 reps - 20-30 sec hold Seated Scapular Retraction - 2 x daily - 7 x weekly - 3 sets - 10 reps - 3 sec hold Seated Cervical Retraction - 2 x daily - 7 x weekly - 2 sets - 10 reps - 3 sec hold Supine Chin Tuck - 2 x daily - 7 x weekly - 2 sets - 10 reps - 3 sec hold Seated Shoulder Row with Anchored Resistance - 2 x daily - 7 x weekly - 2-3 sets - 10 reps - 3-5 hold    ASSESSMENT:   CLINICAL IMPRESSION: Pt arrives reporting compliance with HEP. Reviewed HEP and progressed with scapular resistance bands. Began manual trigger point release to right upper trap. Briefly discussed TPDN. She was given an updated HEP.    REHAB POTENTIAL: Good   CLINICAL DECISION MAKING: Evolving/moderate complexity   EVALUATION COMPLEXITY: Moderate     GOALS: Goals reviewed with patient? No   SHORT TERM GOALS:   STG Name Target Date Goal status  1 Pt will be compliant and knowledgeable with initial HEP for improved comfort and carryover Baseline: initial HEP given 05/02/2021 INITIAL  2 Pt will self report neck and L UE pain no greater than 6/10 for improved comfort and functional ability Baseline: 8/10 at worst 05/02/2021 INITIAL    LONG TERM GOALS:    LTG Name Target Date Goal status  1 Pt will improve FOTO function score to no less than 67% as proxy for functional improvement Baseline: 59% function 07/04/2021 INITIAL  2 Pt will self report neck and L UE pain no greater than  2/10 for improved comfort and functional ability Baseline: 8/10 at worst 07/04/2021 INITIAL  3 Pt will improve R cervical rotation to no less than 60 deg for improved functional ability Baseline: 48 deg 07/04/2021 INITIAL    PLAN: PT FREQUENCY: 2x/week   PT DURATION: 12 weeks   PLANNED INTERVENTIONS: Therapeutic exercises, Therapeutic activity, Neuro Muscular re-education, Balance training, Gait training, Patient/Family education, Joint mobilization, Aquatic Therapy, Dry Needling, Cryotherapy, Moist heat, and Manual therapy   PLAN FOR NEXT SESSION: assess response to HEP; progress periscapular and DNF exercises, assess response to manual/HMP     Hessie Diener, PTA 04/16/21 2:02 PM Phone: [phone] Fax: [phone]

## 2021-04-17 DIAGNOSIS — G4733 Obstructive sleep apnea (adult) (pediatric): Secondary | ICD-10-CM | POA: Diagnosis not present

## 2021-04-18 ENCOUNTER — Ambulatory Visit: Payer: Medicare Other | Admitting: Family Medicine

## 2021-04-20 ENCOUNTER — Ambulatory Visit: Payer: Medicare Other | Admitting: Physical Therapy

## 2021-04-20 ENCOUNTER — Other Ambulatory Visit: Payer: Self-pay

## 2021-04-20 ENCOUNTER — Encounter: Payer: Self-pay | Admitting: Physical Therapy

## 2021-04-20 DIAGNOSIS — R2689 Other abnormalities of gait and mobility: Secondary | ICD-10-CM | POA: Diagnosis not present

## 2021-04-20 DIAGNOSIS — M5412 Radiculopathy, cervical region: Secondary | ICD-10-CM | POA: Diagnosis not present

## 2021-04-20 DIAGNOSIS — M542 Cervicalgia: Secondary | ICD-10-CM

## 2021-04-20 DIAGNOSIS — M6281 Muscle weakness (generalized): Secondary | ICD-10-CM | POA: Diagnosis not present

## 2021-04-20 DIAGNOSIS — M25512 Pain in left shoulder: Secondary | ICD-10-CM | POA: Diagnosis not present

## 2021-04-20 NOTE — Therapy (Addendum)
OUTPATIENT PHYSICAL THERAPY TREATMENT NOTE   Patient Name: Elizabeth Barron MRN: [id number] DOB:1946-02-22, 76 y.o., female Today's Date: 04/20/2021  PCP: Seward Carol, MD REFERRING PROVIDER: Seward Carol, MD   PT End of Session - 04/20/21 1005     Visit Number 3    Number of Visits 26    Date for PT Re-Evaluation 07/04/21    Authorization Type [employer] Medicare - FOTO 6th and 10th    PT Start Time 1006    PT Stop Time 1049    PT Time Calculation (min) 43 min    Activity Tolerance Patient limited by pain    Behavior During Therapy [facility] for tasks assessed/performed             Past Medical History:  Diagnosis Date   Arthritis    knees. back   Colon polyps    GERD (gastroesophageal reflux disease)    Glaucoma    Headache    Hypercholesteremia    Hypothyroidism    thyroid removed   Insomnia    Low back pain    Obesity    Osteoarthritis    r hip, left hip   Restless leg syndrome    Past Surgical History:  Procedure Laterality Date   ANTERIOR CRUCIATE LIGAMENT REPAIR     BREAST LUMPECTOMY     L breast- benign   BUNIONECTOMY Left 2012   COLONOSCOPY WITH PROPOFOL N/A 10/11/2014   Procedure: COLONOSCOPY WITH PROPOFOL;  Surgeon: Garlan Fair, MD;  Location: [facility];  Service: Endoscopy;  Laterality: N/A;   KNEE SURGERY     bilateral scope surgery   MAXIMUM ACCESS (MAS)POSTERIOR LUMBAR INTERBODY FUSION (PLIF) 2 LEVEL N/A 12/06/2015   Procedure: Lumbar four-five, Lumbar five-Sacral one MAXIMUM ACCESS POSTERIOR LUMBAR INTERBODY FUSION   ;  Surgeon: Eustace Moore, MD;  Location: [place] [facility];  Service: Neurosurgery;  Laterality: N/A;   RADIOLOGY WITH ANESTHESIA Left 09/05/2016   Procedure: MRI OF LEFT HIP WITHOUT;  Surgeon: Radiologist, Medication, MD;  Location: [place];  Service: Radiology;  Laterality: Left;   THYROIDECTOMY     TONSILLECTOMY  1955   child   TUBAL LIGATION     VAGINAL HYSTERECTOMY  1981   Patient Active Problem List   Diagnosis Date Noted    Cervical radiculopathy 03/28/2021   S/P lumbar spinal fusion 12/06/2015   Cough 11/28/2011  PCP: Seward Carol, MD   REFERRING PROVIDER: Rosemarie Ax, MD   REFERRING DIAG: [id number] (ICD-10-CM) - Cervical radiculopathy  THERAPY DIAG:  Cervicalgia  Acute pain of left shoulder  ONSET DATE: December 2022   PERTINENT HISTORY:  Recent onset of neck and L UE pain; also reports chronic lower back pain and balance deficits  PRECAUTIONS: None   WEIGHT BEARING RESTRICTIONS No  SUBJECTIVE: I'm feeling really stiff today and hurting kind of all over.  PAIN:  Are you having pain? Yes NPRS scale: 7/10 Pain location: L side of neck, L UE, lower back PAIN TYPE: aching Pain description: intermittent  Aggravating factors: reaching, driving Relieving factors: heat     (OBJECTIVE:    DIAGNOSTIC FINDINGS:  CLINICAL DATA:  Neck pain.   EXAM: CERVICAL SPINE - 2-3 VIEW   COMPARISON:  None.   FINDINGS: There is no acute fracture or subluxation of the cervical spine. Multilevel degenerative changes with disc space narrowing and endplate irregularity and spurring. The visualized posterior elements and odontoid appear intact. There is anatomic alignment of the lateral masses of C1 and C2. The  soft tissues are unremarkable. Multiple surgical clips noted over the neck.   IMPRESSION: No acute/traumatic cervical spine pathology.    PATIENT SURVEYS:  FOTO 59% function; 67% predicted   COGNITION: Overall cognitive status: Within functional limits for tasks assessed            SENSATION: Light touch: Appears intact   POSTURE:  Medium body habitus; rounded shoulders, fwd head - difficulty with lying supine   PALPATION: TTP to L upper trap, L cervical paraspinals          CERVICAL AROM/PROM   A/PROM A/PROM (deg) 04/11/2021  Flexion    Extension    Right lateral flexion    Left lateral flexion    Right rotation 48  Left rotation 60   (Blank rows = not tested)   UE  AROM/PROM:   A/PROM Right 04/11/2021 Left 04/11/2021  Shoulder flexion   150  Shoulder extension      Shoulder abduction   80 p!  Shoulder adduction      Shoulder extension      Shoulder internal rotation      Shoulder external rotation      Elbow flexion      Elbow extension      Wrist flexion      Wrist extension      Wrist ulnar deviation      Wrist radial deviation      Wrist pronation      Wrist supination       (Blank rows = not tested)   UPPER EXTREMITY MMT:   MMT Right 04/11/2021 Left 04/11/2021  Shoulder flexion 5/5 3/5  Shoulder abduction (C5)   2+/5  Shoulder ER      Shoulder IR      Middle trapezius      Lower trapezius      Shoulder extension      Grip strength 10# 20#    (Blank rows = not tested, score listed is out of 5 possible points.  N = WNL, D = diminished, C = clear for gross weakness with myotome testing, * = concordant pain with testing)    CERVICAL SPECIAL TESTS:  Upper limb tension test (ULTT): Negative and Spurling's test: Negative     FUNCTIONAL TESTS:  N/A     TODAY'S TREATMENT:   [facility] Adult PT Treatment:                                                DATE: 04/20/2021 Therapeutic Exercise: R/L upper trap stretch 3 x 30" R/L levator stretch 3 x 30 Seated scapular retraction x 10 - 3" hold Seated row RTB 2 x 10 Supine chin tuck x 10- 5" over towel Supine horizontal abduction RTB 2 x 10 Supine diagonals RTB x 10 bil Manual Therapy: (pt in supine) Manual cervical traction Positional release L/R upper trap STM L upper trap Trigger point release (manual) x 2 left upper trap  Modalities: HMP x 10 min, cervical    [facility] Adult PT Treatment:                                                DATE: 04/16/2021 Therapeutic Exercise: R/L upper trap stretch 3 x 30"  R/L levator stretch Seated scapular retraction x 10 - 3" hold Seated chin tuck 5 sec x 10 Seated vs standing row red band 10 reps 3 sets Supine chin tuck x 10- 5"  Manual  Therapy: Manual cervical traction Positional release L/R upper trap Passive levator stretch Trigger point release (manual) x 2 left upper trap  Neuromuscular re-ed: N/A Therapeutic Activity: N/A Modalities: HMP x 10 min, cervical  Self Care: N/A   [facility] Adult PT Treatment:                                                DATE: 04/11/2021 Therapeutic Exercise: L upper trap stretch x 30" Seated scapular retraction x 10 - 3" hold Supine chin tuck x 5 - 5" Manual Therapy: Suboccipital release  Positional release L upper trap Neuromuscular re-ed: N/A Therapeutic Activity: N/A Modalities: N/A Self Care: N/A       PATIENT EDUCATION:  Education details: continue   HEP Person educated: Patient Education method: Explanation, Demonstration, and Handouts Education comprehension: verbalized understanding and returned demonstration   HOME EXERCISE PROGRAM: Access Code: [email or web address] URL: [email or web address] Date: 04/16/2021 Prepared by: Hessie Diener  Exercises Seated Upper Trapezius Stretch (Mirrored) - 2 x daily - 7 x weekly - 3 reps - 20-30 sec hold Seated Scapular Retraction - 2 x daily - 7 x weekly - 3 sets - 10 reps - 3 sec hold Seated Cervical Retraction - 2 x daily - 7 x weekly - 2 sets - 10 reps - 3 sec hold Supine Chin Tuck - 2 x daily - 7 x weekly - 2 sets - 10 reps - 3 sec hold Seated Shoulder Row with Anchored Resistance - 2 x daily - 7 x weekly - 2-3 sets - 10 reps - 3-5 hold    ASSESSMENT:   CLINICAL IMPRESSION: Pt arrives reporting compliance with HEP and added band exercises.She experiences pain in L neck/shoulder with resistance exercises and performs sets with increased time and frequent rest breaks. Therapist performed manual trigger point release to right upper trap and briefly discussed TPDN. Patient continues to benefit from skilled PT services and should be progressed as able to improve functional independence.   REHAB POTENTIAL: Good    CLINICAL DECISION MAKING: Evolving/moderate complexity   EVALUATION COMPLEXITY: Moderate     GOALS: Goals reviewed with patient? No   SHORT TERM GOALS:   STG Name Target Date Goal status  1 Pt will be compliant and knowledgeable with initial HEP for improved comfort and carryover Baseline: initial HEP given 05/02/2021 INITIAL  2 Pt will self report neck and L UE pain no greater than 6/10 for improved comfort and functional ability Baseline: 8/10 at worst 05/02/2021 INITIAL    LONG TERM GOALS:    LTG Name Target Date Goal status  1 Pt will improve FOTO function score to no less than 67% as proxy for functional improvement Baseline: 59% function 07/04/2021 INITIAL  2 Pt will self report neck and L UE pain no greater than 2/10 for improved comfort and functional ability Baseline: 8/10 at worst 07/04/2021 INITIAL  3 Pt will improve R cervical rotation to no less than 60 deg for improved functional ability Baseline: 48 deg 07/04/2021 INITIAL    PLAN: PT FREQUENCY: 2x/week   PT DURATION: 12 weeks   PLANNED INTERVENTIONS: Therapeutic exercises, Therapeutic activity, Neuro Muscular re-education, Balance  training, Gait training, Patient/Family education, Joint mobilization, Aquatic Therapy, Dry Needling, Cryotherapy, Moist heat, and Manual therapy   PLAN FOR NEXT SESSION: progress periscapular and DNF exercises, assess response to manual/HMP, may be interested in [facility] next session for L upper trap     Evelene Croon, PTA 04/20/21 11:05 AM

## 2021-04-23 ENCOUNTER — Ambulatory Visit: Payer: Medicare Other

## 2021-04-23 NOTE — Therapy (Incomplete)
OUTPATIENT PHYSICAL THERAPY TREATMENT NOTE   Patient Name: Elizabeth Barron MRN: [id number] DOB:01-06-1946, 76 y.o., female Today's Date: 04/23/2021  PCP: Seward Carol, MD REFERRING PROVIDER: Seward Carol, MD     Past Medical History:  Diagnosis Date   Arthritis    knees. back   Colon polyps    GERD (gastroesophageal reflux disease)    Glaucoma    Headache    Hypercholesteremia    Hypothyroidism    thyroid removed   Insomnia    Low back pain    Obesity    Osteoarthritis    r hip, left hip   Restless leg syndrome    Past Surgical History:  Procedure Laterality Date   ANTERIOR CRUCIATE LIGAMENT REPAIR     BREAST LUMPECTOMY     L breast- benign   BUNIONECTOMY Left 2012   COLONOSCOPY WITH PROPOFOL N/A 10/11/2014   Procedure: COLONOSCOPY WITH PROPOFOL;  Surgeon: Garlan Fair, MD;  Location: [facility];  Service: Endoscopy;  Laterality: N/A;   KNEE SURGERY     bilateral scope surgery   MAXIMUM ACCESS (MAS)POSTERIOR LUMBAR INTERBODY FUSION (PLIF) 2 LEVEL N/A 12/06/2015   Procedure: Lumbar four-five, Lumbar five-Sacral one MAXIMUM ACCESS POSTERIOR LUMBAR INTERBODY FUSION   ;  Surgeon: Eustace Moore, MD;  Location: [place] [facility];  Service: Neurosurgery;  Laterality: N/A;   RADIOLOGY WITH ANESTHESIA Left 09/05/2016   Procedure: MRI OF LEFT HIP WITHOUT;  Surgeon: Radiologist, Medication, MD;  Location: [place];  Service: Radiology;  Laterality: Left;   THYROIDECTOMY     TONSILLECTOMY  1955   child   TUBAL LIGATION     VAGINAL HYSTERECTOMY  1981   Patient Active Problem List   Diagnosis Date Noted   Cervical radiculopathy 03/28/2021   S/P lumbar spinal fusion 12/06/2015   Cough 11/28/2011  PCP: Seward Carol, MD   REFERRING PROVIDER: Rosemarie Ax, MD   REFERRING DIAG: [phone] (ICD-10-CM) - Cervical radiculopathy  THERAPY DIAG:  No diagnosis found.  ONSET DATE: December 2022   PERTINENT HISTORY:  Recent onset of neck and L UE pain; also reports  chronic lower back pain and balance deficits  PRECAUTIONS: None   WEIGHT BEARING RESTRICTIONS No  SUBJECTIVE: I'm feeling really stiff today and hurting kind of all over.  PAIN:  Are you having pain? Yes NPRS scale: 7/10 Pain location: L side of neck, L UE, lower back PAIN TYPE: aching Pain description: intermittent  Aggravating factors: reaching, driving Relieving factors: heat     (OBJECTIVE:    PATIENT SURVEYS:  FOTO 59% function; 67% predicted   CERVICAL AROM/PROM   A/PROM A/PROM (deg) 04/11/2021  Flexion    Extension    Right lateral flexion    Left lateral flexion    Right rotation 48  Left rotation 60   (Blank rows = not tested)   UE AROM/PROM:   A/PROM Right 04/11/2021 Left 04/11/2021  Shoulder flexion   150  Shoulder extension      Shoulder abduction   80 p!  Shoulder adduction      Shoulder extension      Shoulder internal rotation      Shoulder external rotation      Elbow flexion      Elbow extension      Wrist flexion      Wrist extension      Wrist ulnar deviation      Wrist radial deviation      Wrist pronation  Wrist supination       (Blank rows = not tested)   UPPER EXTREMITY MMT:   MMT Right 04/11/2021 Left 04/11/2021  Shoulder flexion 5/5 3/5  Shoulder abduction (C5)   2+/5  Shoulder ER      Shoulder IR      Middle trapezius      Lower trapezius      Shoulder extension      Grip strength 10# 20#    (Blank rows = not tested, score listed is out of 5 possible points.  N = WNL, D = diminished, C = clear for gross weakness with myotome testing, * = concordant pain with testing)    CERVICAL SPECIAL TESTS:  Upper limb tension test (ULTT): Negative and Spurling's test: Negative     FUNCTIONAL TESTS:  N/A     TODAY'S TREATMENT:   [facility] Adult PT Treatment:                                                DATE: 04/23/2021 Therapeutic Exercise: R/L upper trap stretch 3 x 30" R/L levator stretch 3 x 30 Seated scapular  retraction x 10 - 3" hold Seated row RTB 2 x 10 Supine chin tuck x 10- 5" over towel Supine horizontal abduction RTB 2 x 10 Supine diagonals RTB x 10 bil Manual Therapy: (pt in supine) Manual cervical traction Positional release L/R upper trap STM L upper trap Trigger point release (manual) x 2 left upper trap  Modalities: HMP x 10 min, cervical   [facility] Adult PT Treatment:                                                DATE: 04/20/2021 Therapeutic Exercise: R/L upper trap stretch 3 x 30" R/L levator stretch 3 x 30 Seated scapular retraction x 10 - 3" hold Seated row RTB 2 x 10 Supine chin tuck x 10- 5" over towel Supine horizontal abduction RTB 2 x 10 Supine diagonals RTB x 10 bil Manual Therapy: (pt in supine) Manual cervical traction Positional release L/R upper trap STM L upper trap Trigger point release (manual) x 2 left upper trap  Modalities: HMP x 10 min, cervical    [facility] Adult PT Treatment:                                      DATE: 04/16/2021 Therapeutic Exercise: R/L upper trap stretch 3 x 30" R/L levator stretch Seated scapular retraction x 10 - 3" hold Seated chin tuck 5 sec x 10 Seated vs standing row red band 10 reps 3 sets Supine chin tuck x 10- 5"  Manual Therapy: Manual cervical traction Positional release L/R upper trap Passive levator stretch Trigger point release (manual) x 2 left upper trap  Neuromuscular re-ed: N/A Therapeutic Activity: N/A Modalities: HMP x 10 min, cervical  Self Care: N/A   PATIENT EDUCATION:  Education details: continue   HEP Person educated: Patient Education method: Explanation, Demonstration, and Handouts Education comprehension: verbalized understanding and returned demonstration   HOME EXERCISE PROGRAM: Access Code: [email or web address] URL: [email or web address] Date: 04/16/2021 Prepared by: Hessie Diener  Exercises Seated Upper Trapezius  Stretch (Mirrored) - 2 x daily - 7 x weekly - 3 reps - 20-30 sec  hold Seated Scapular Retraction - 2 x daily - 7 x weekly - 3 sets - 10 reps - 3 sec hold Seated Cervical Retraction - 2 x daily - 7 x weekly - 2 sets - 10 reps - 3 sec hold Supine Chin Tuck - 2 x daily - 7 x weekly - 2 sets - 10 reps - 3 sec hold Seated Shoulder Row with Anchored Resistance - 2 x daily - 7 x weekly - 2-3 sets - 10 reps - 3-5 hold    ASSESSMENT:   CLINICAL IMPRESSION: ***   REHAB POTENTIAL: Good   CLINICAL DECISION MAKING: Evolving/moderate complexity   EVALUATION COMPLEXITY: Moderate     GOALS: Goals reviewed with patient? No   SHORT TERM GOALS:   STG Name Target Date Goal status  1 Pt will be compliant and knowledgeable with initial HEP for improved comfort and carryover Baseline: initial HEP given 05/02/2021 INITIAL  2 Pt will self report neck and L UE pain no greater than 6/10 for improved comfort and functional ability Baseline: 8/10 at worst 05/02/2021 INITIAL    LONG TERM GOALS:    LTG Name Target Date Goal status  1 Pt will improve FOTO function score to no less than 67% as proxy for functional improvement Baseline: 59% function 07/04/2021 INITIAL  2 Pt will self report neck and L UE pain no greater than 2/10 for improved comfort and functional ability Baseline: 8/10 at worst 07/04/2021 INITIAL  3 Pt will improve R cervical rotation to no less than 60 deg for improved functional ability Baseline: 48 deg 07/04/2021 INITIAL    PLAN: PT FREQUENCY: 2x/week   PT DURATION: 12 weeks   PLANNED INTERVENTIONS: Therapeutic exercises, Therapeutic activity, Neuro Muscular re-education, Balance training, Gait training, Patient/Family education, Joint mobilization, Aquatic Therapy, Dry Needling, Cryotherapy, Moist heat, and Manual therapy   PLAN FOR NEXT SESSION: progress periscapular and DNF exercises, assess response to manual/HMP, may be interested in TPDN next session for L upper trap    Ward Chatters, PT 04/23/21 7:58 AM

## 2021-04-24 ENCOUNTER — Ambulatory Visit: Payer: Medicare Other | Admitting: Family Medicine

## 2021-04-25 ENCOUNTER — Ambulatory Visit: Payer: Medicare Other

## 2021-04-30 ENCOUNTER — Other Ambulatory Visit: Payer: Self-pay

## 2021-04-30 ENCOUNTER — Ambulatory Visit: Payer: Medicare Other | Attending: Family Medicine

## 2021-04-30 DIAGNOSIS — M545 Low back pain, unspecified: Secondary | ICD-10-CM | POA: Insufficient documentation

## 2021-04-30 DIAGNOSIS — R2689 Other abnormalities of gait and mobility: Secondary | ICD-10-CM | POA: Insufficient documentation

## 2021-04-30 DIAGNOSIS — M542 Cervicalgia: Secondary | ICD-10-CM | POA: Insufficient documentation

## 2021-04-30 DIAGNOSIS — M25512 Pain in left shoulder: Secondary | ICD-10-CM | POA: Diagnosis not present

## 2021-04-30 DIAGNOSIS — M6281 Muscle weakness (generalized): Secondary | ICD-10-CM | POA: Insufficient documentation

## 2021-04-30 DIAGNOSIS — G8929 Other chronic pain: Secondary | ICD-10-CM | POA: Diagnosis not present

## 2021-04-30 NOTE — Therapy (Signed)
OUTPATIENT PHYSICAL THERAPY TREATMENT NOTE   Patient Name: Elizabeth Barron MRN: [id number] DOB:1946/01/10, 76 y.o., female Today's Date: 04/30/2021  PCP: Seward Carol, MD REFERRING PROVIDER: Seward Carol, MD   PT End of Session - 04/30/21 1215     Visit Number 4    Number of Visits 26    Date for PT Re-Evaluation 07/04/21    Authorization Type [employer] - FOTO 6th and 10th    PT Start Time 1215    PT Stop Time 1255    PT Time Calculation (min) 40 min    Activity Tolerance Patient limited by pain    Behavior During Therapy [facility] for tasks assessed/performed              Past Medical History:  Diagnosis Date   Arthritis    knees. back   Colon polyps    GERD (gastroesophageal reflux disease)    Glaucoma    Headache    Hypercholesteremia    Hypothyroidism    thyroid removed   Insomnia    Low back pain    Obesity    Osteoarthritis    r hip, left hip   Restless leg syndrome    Past Surgical History:  Procedure Laterality Date   ANTERIOR CRUCIATE LIGAMENT REPAIR     BREAST LUMPECTOMY     L breast- benign   BUNIONECTOMY Left 2012   COLONOSCOPY WITH PROPOFOL N/A 10/11/2014   Procedure: COLONOSCOPY WITH PROPOFOL;  Surgeon: Garlan Fair, MD;  Location: [facility];  Service: Endoscopy;  Laterality: N/A;   KNEE SURGERY     bilateral scope surgery   MAXIMUM ACCESS (MAS)POSTERIOR LUMBAR INTERBODY FUSION (PLIF) 2 LEVEL N/A 12/06/2015   Procedure: Lumbar four-five, Lumbar five-Sacral one MAXIMUM ACCESS POSTERIOR LUMBAR INTERBODY FUSION   ;  Surgeon: Eustace Moore, MD;  Location: [place] [facility];  Service: Neurosurgery;  Laterality: N/A;   RADIOLOGY WITH ANESTHESIA Left 09/05/2016   Procedure: MRI OF LEFT HIP WITHOUT;  Surgeon: Radiologist, Medication, MD;  Location: [place];  Service: Radiology;  Laterality: Left;   THYROIDECTOMY     TONSILLECTOMY  1955   child   TUBAL LIGATION     VAGINAL HYSTERECTOMY  1981   Patient Active Problem List   Diagnosis Date Noted    Cervical radiculopathy 03/28/2021   S/P lumbar spinal fusion 12/06/2015   Cough 11/28/2011  PCP: Seward Carol, MD   REFERRING PROVIDER: Rosemarie Ax, MD   REFERRING DIAG: [phone] (ICD-10-CM) - Cervical radiculopathy  THERAPY DIAG:  Cervicalgia  Acute pain of left shoulder  Muscle weakness (generalized)  Other abnormalities of gait and mobility  ONSET DATE: December 2022   PERTINENT HISTORY:  Recent onset of neck and L UE pain; also reports chronic lower back pain and balance deficits  PRECAUTIONS: None   WEIGHT BEARING RESTRICTIONS No  SUBJECTIVE:  Pt presents to PT with reports of neck pain and discomfort. Notes that she had to drive to [place] a few days ago and feels like this has increased her neck pain. Also notes some lower back discomfort as well. She is ready to begin PT treatment at this time.   PAIN:  Are you having pain? Yes NPRS scale: 9/10 Pain location: L side of neck, L UE, lower back PAIN TYPE: aching Pain description: intermittent  Aggravating factors: reaching, driving Relieving factors: heat     (OBJECTIVE:    PATIENT SURVEYS:  FOTO 59% function; 67% predicted   CERVICAL AROM/PROM   A/PROM  A/PROM (deg) 04/11/2021  Flexion    Extension    Right lateral flexion    Left lateral flexion    Right rotation 48  Left rotation 60   (Blank rows = not tested)   UE AROM/PROM:   A/PROM Right 04/11/2021 Left 04/11/2021  Shoulder flexion   150  Shoulder extension      Shoulder abduction   80 p!  Shoulder adduction      Shoulder extension      Shoulder internal rotation      Shoulder external rotation      Elbow flexion      Elbow extension      Wrist flexion      Wrist extension      Wrist ulnar deviation      Wrist radial deviation      Wrist pronation      Wrist supination       (Blank rows = not tested)   UPPER EXTREMITY MMT:   MMT Right 04/11/2021 Left 04/11/2021  Shoulder flexion 5/5 3/5  Shoulder abduction (C5)    2+/5  Shoulder ER      Shoulder IR      Middle trapezius      Lower trapezius      Shoulder extension      Grip strength 10# 20#    (Blank rows = not tested, score listed is out of 5 possible points.  N = WNL, D = diminished, C = clear for gross weakness with myotome testing, * = concordant pain with testing)    CERVICAL SPECIAL TESTS:  Upper limb tension test (ULTT): Negative and Spurling's test: Negative     FUNCTIONAL TESTS:  N/A     TODAY'S TREATMENT:   [facility] Adult PT Treatment:                                                DATE: 04/30/2021 Therapeutic Exercise: NuStep lvl 6 UE/LE x 5 min while taking subjective  Seated scapular retraction x 10 - 3" hold Seated row GTB 2x10 Supine chin tuck x 10- 5" over towel Supine horizontal abduction GTB 2 x 10 Supine D2 flexion L UE 2x10 RTB Manual Therapy: (pt in supine) Manual cervical traction Positional release L/R upper trap STM L upper trap Trigger point release (manual) L upper trap Modalities: HMP x 10 min, cervical   [facility] Adult PT Treatment:                                                DATE: 04/20/2021 Therapeutic Exercise: R/L upper trap stretch 3 x 30" R/L levator stretch 3 x 30 Seated scapular retraction x 10 - 3" hold Seated row RTB 2 x 10 Supine chin tuck x 10- 5" over towel Supine horizontal abduction RTB 2 x 10 Supine diagonals RTB x 10 bil Manual Therapy: (pt in supine) Manual cervical traction Positional release L/R upper trap STM L upper trap Trigger point release (manual) x 2 left upper trap  Modalities: HMP x 10 min, cervical    [facility] Adult PT Treatment:  DATE: 04/16/2021 Therapeutic Exercise: R/L upper trap stretch 3 x 30" R/L levator stretch Seated scapular retraction x 10 - 3" hold Seated chin tuck 5 sec x 10 Seated vs standing row red band 10 reps 3 sets Supine chin tuck x 10- 5"  Manual Therapy: Manual cervical traction Positional release L/R upper  trap Passive levator stretch Trigger point release (manual) x 2 left upper trap  Neuromuscular re-ed: N/A Therapeutic Activity: N/A Modalities: HMP x 10 min, cervical  Self Care: N/A   PATIENT EDUCATION:  Education details: continue   HEP Person educated: Patient Education method: Explanation, Demonstration, and Handouts Education comprehension: verbalized understanding and returned demonstration   HOME EXERCISE PROGRAM: Access Code: [email or web address] URL: [email or web address] Date: 04/16/2021 Prepared by: Hessie Diener  Exercises Seated Upper Trapezius Stretch (Mirrored) - 2 x daily - 7 x weekly - 3 reps - 20-30 sec hold Seated Scapular Retraction - 2 x daily - 7 x weekly - 3 sets - 10 reps - 3 sec hold Seated Cervical Retraction - 2 x daily - 7 x weekly - 2 sets - 10 reps - 3 sec hold Supine Chin Tuck - 2 x daily - 7 x weekly - 2 sets - 10 reps - 3 sec hold Seated Shoulder Row with Anchored Resistance - 2 x daily - 7 x weekly - 2-3 sets - 10 reps - 3-5 hold    ASSESSMENT:   CLINICAL IMPRESSION: Pt was able to complete all prescribed exercises with no adverse effect. She responded fair to manual therapy interventions, but continued to note severe pain post session. PT will continue to progress exercises as able focused on improving DNF endurance and periscapular strength. Will also ask referring physician about adding referral to work with pt regarding her LBP per her request.    REHAB POTENTIAL: Good   CLINICAL DECISION MAKING: Evolving/moderate complexity   EVALUATION COMPLEXITY: Moderate     GOALS: Goals reviewed with patient? No   SHORT TERM GOALS:   STG Name Target Date Goal status  1 Pt will be compliant and knowledgeable with initial HEP for improved comfort and carryover Baseline: initial HEP given 05/02/2021 MET  2 Pt will self report neck and L UE pain no greater than 6/10 for improved comfort and functional ability Baseline: 8/10 at worst  05/02/2021 ONGOING    LONG TERM GOALS:    LTG Name Target Date Goal status  1 Pt will improve FOTO function score to no less than 67% as proxy for functional improvement Baseline: 59% function 07/04/2021 ONGOING  2 Pt will self report neck and L UE pain no greater than 2/10 for improved comfort and functional ability Baseline: 8/10 at worst 07/04/2021 ONGOING  3 Pt will improve R cervical rotation to no less than 60 deg for improved functional ability Baseline: 48 deg 07/04/2021 ONGOING    PLAN: PT FREQUENCY: 2x/week   PT DURATION: 12 weeks   PLANNED INTERVENTIONS: Therapeutic exercises, Therapeutic activity, Neuro Muscular re-education, Balance training, Gait training, Patient/Family education, Joint mobilization, Aquatic Therapy, Dry Needling, Cryotherapy, Moist heat, and Manual therapy   PLAN FOR NEXT SESSION: progress periscapular and DNF exercises, assess response to manual/HMP, may be interested in TPDN next session for L upper trap    Ward Chatters, PT 04/30/21 1:45 PM

## 2021-05-01 ENCOUNTER — Other Ambulatory Visit: Payer: Self-pay | Admitting: Family Medicine

## 2021-05-01 DIAGNOSIS — M545 Low back pain, unspecified: Secondary | ICD-10-CM

## 2021-05-02 ENCOUNTER — Other Ambulatory Visit: Payer: Self-pay

## 2021-05-02 ENCOUNTER — Ambulatory Visit: Payer: Medicare Other

## 2021-05-02 DIAGNOSIS — G8929 Other chronic pain: Secondary | ICD-10-CM

## 2021-05-02 DIAGNOSIS — M545 Low back pain, unspecified: Secondary | ICD-10-CM

## 2021-05-02 DIAGNOSIS — M6281 Muscle weakness (generalized): Secondary | ICD-10-CM | POA: Diagnosis not present

## 2021-05-02 DIAGNOSIS — R2689 Other abnormalities of gait and mobility: Secondary | ICD-10-CM | POA: Diagnosis not present

## 2021-05-02 DIAGNOSIS — M25512 Pain in left shoulder: Secondary | ICD-10-CM

## 2021-05-02 DIAGNOSIS — M542 Cervicalgia: Secondary | ICD-10-CM | POA: Diagnosis not present

## 2021-05-02 NOTE — Therapy (Addendum)
OUTPATIENT PHYSICAL THERAPY TREATMENT NOTE/RE-EVALUATION   Patient Name: Elizabeth Barron MRN: [id number] DOB:1945-09-26, 76 y.o., female Today's Date: 05/02/2021  PCP: Seward Carol, MD REFERRING PROVIDER: Rosemarie Ax, MD   PT End of Session - 05/02/21 1214     Visit Number 5    Number of Visits 26    Date for PT Re-Evaluation 07/04/21    Authorization Type [employer] Medicare - FOTO 6th and 10th    PT Start Time 1215    PT Stop Time 1258    PT Time Calculation (min) 43 min    Activity Tolerance Patient limited by pain    Behavior During Therapy [facility] for tasks assessed/performed              Past Medical History:  Diagnosis Date   Arthritis    knees. back   Colon polyps    GERD (gastroesophageal reflux disease)    Glaucoma    Headache    Hypercholesteremia    Hypothyroidism    thyroid removed   Insomnia    Low back pain    Obesity    Osteoarthritis    r hip, left hip   Restless leg syndrome    Past Surgical History:  Procedure Laterality Date   ANTERIOR CRUCIATE LIGAMENT REPAIR     BREAST LUMPECTOMY     L breast- benign   BUNIONECTOMY Left 2012   COLONOSCOPY WITH PROPOFOL N/A 10/11/2014   Procedure: COLONOSCOPY WITH PROPOFOL;  Surgeon: Garlan Fair, MD;  Location: [facility];  Service: Endoscopy;  Laterality: N/A;   KNEE SURGERY     bilateral scope surgery   MAXIMUM ACCESS (MAS)POSTERIOR LUMBAR INTERBODY FUSION (PLIF) 2 LEVEL N/A 12/06/2015   Procedure: Lumbar four-five, Lumbar five-Sacral one MAXIMUM ACCESS POSTERIOR LUMBAR INTERBODY FUSION   ;  Surgeon: Eustace Moore, MD;  Location: [place] [facility];  Service: Neurosurgery;  Laterality: N/A;   RADIOLOGY WITH ANESTHESIA Left 09/05/2016   Procedure: MRI OF LEFT HIP WITHOUT;  Surgeon: Radiologist, Medication, MD;  Location: [place];  Service: Radiology;  Laterality: Left;   THYROIDECTOMY     TONSILLECTOMY  1955   child   TUBAL LIGATION     VAGINAL HYSTERECTOMY  1981   Patient Active Problem List    Diagnosis Date Noted   Cervical radiculopathy 03/28/2021   S/P lumbar spinal fusion 12/06/2015   Cough 11/28/2011  PCP: Seward Carol, MD   REFERRING PROVIDER: Rosemarie Ax, MD   REFERRING DIAG: [phone] (ICD-10-CM) - Cervical radiculopathy  THERAPY DIAG:  Cervicalgia  Acute pain of left shoulder  Muscle weakness (generalized)  Other abnormalities of gait and mobility  Chronic bilateral low back pain, unspecified whether sciatica present  ONSET DATE: December 2022   PERTINENT HISTORY:  Recent onset of neck and L UE pain; also reports chronic lower back pain and balance deficits  PRECAUTIONS: None   WEIGHT BEARING RESTRICTIONS No  SUBJECTIVE:  Pt presents to PT with reports of continued severe L shoulder and lower back pain. Her lower back is not as bad as the shoulder, but has been a chronic issue for her. Walking tends to sharply increase pain with referral into bilateral LE, L>R. Pt denies N/T down either LE, mainly notes pain. Pt would like to increase walking distance and with decreased assistance needed by AD in order to improve mobility and safety.  Pain: Are you having pain? Yes NPRS: 9/10 Pain Location: left shoulder, lower back PAIN TYPE: aching Pain description: intermittent  Aggravating  factors: reaching, driving Relieving factors: heat   (OBJECTIVE:    PATIENT SURVEYS:  FOTO 59% function; 67% predicted   CERVICAL AROM/PROM   A/PROM A/PROM (deg) 04/11/2021  Flexion    Extension    Right lateral flexion    Left lateral flexion    Right rotation 48  Left rotation 60   (Blank rows = not tested)   UE AROM/PROM:   A/PROM Right 04/11/2021 Left 04/11/2021  Shoulder flexion   150  Shoulder extension      Shoulder abduction   80 p!  Shoulder adduction      Shoulder extension      Shoulder internal rotation      Shoulder external rotation      Elbow flexion      Elbow extension      Wrist flexion      Wrist extension      Wrist ulnar  deviation      Wrist radial deviation      Wrist pronation      Wrist supination       (Blank rows = not tested)   UPPER EXTREMITY MMT:   MMT Right 04/11/2021 Left 04/11/2021  Shoulder flexion 5/5 3/5  Shoulder abduction (C5)   2+/5  Shoulder ER      Shoulder IR      Middle trapezius      Lower trapezius      Shoulder extension      Grip strength 10# 20#    (Blank rows = not tested, score listed is out of 5 possible points.  N = WNL, D = diminished, C = clear for gross weakness with myotome testing, * = concordant pain with testing)    CERVICAL SPECIAL TESTS:  Upper limb tension test (ULTT): Negative and Spurling's test: Negative     FUNCTIONAL TESTS:  30 Second Sit to Stand: 4 reps 2MWT: 12f (44.8 meters)    TODAY'S TREATMENT:   [facility] Adult PT Treatment:                                                DATE: 05/02/2021 Therapeutic Exercise: Supine clamshell x 10 GTB  Supine PPT x 10 - 5" Supine chin tuck x 10 - 5" Supine chin tuck with rotation 2x5 Supine horizontal abd 2x10 YTB Supine D2 L UE flexion with YTB Seated scapular retraction x 10  Therapeutic Activity: (pt in supine) Assessment of tests/measures and outcomes for new referral and re-eval for chronic LBP Modalities: HMP x 10 min, cervical   [facility] Adult PT Treatment:                                                DATE: 04/30/2021 Therapeutic Exercise: NuStep lvl 6 UE/LE x 5 min while taking subjective  Seated scapular retraction x 10 - 3" hold Seated row GTB 2x10 Supine chin tuck x 10- 5" over towel Supine horizontal abduction GTB 2 x 10 Supine D2 flexion L UE 2x10 RTB Manual Therapy: (pt in supine) Manual cervical traction Positional release L/R upper trap STM L upper trap Trigger point release (manual) L upper trap Modalities: HMP x 10 min, cervical   PATIENT EDUCATION:  Education details: new HEP for  lower back Person educated: Patient Education method: Explanation, Demonstration, and  Handouts Education comprehension: verbalized understanding and returned demonstration   HOME EXERCISE PROGRAM: Access Code: [email or web address] URL: [email or web address] Date: 05/02/2021 Prepared by: Octavio Manns  Exercises Seated Upper Trapezius Stretch (Mirrored) - 2 x daily - 7 x weekly - 3 reps - 20-30 sec hold Seated Scapular Retraction - 2 x daily - 7 x weekly - 3 sets - 10 reps - 3 sec hold Seated Cervical Retraction - 2 x daily - 7 x weekly - 2 sets - 10 reps - 3 sec hold Supine Chin Tuck - 2 x daily - 7 x weekly - 2 sets - 10 reps - 3 sec hold Seated Shoulder Row with Anchored Resistance - 2 x daily - 7 x weekly - 2-3 sets - 10 reps - 3-5 hold Hooklying Clamshell with Resistance - 1 x daily - 7 x weekly - 2 sets - 10 reps - green theraband hold Supine Posterior Pelvic Tilt - 1 x daily - 7 x weekly - 2 sets - 10 reps - 5 sec hold   ASSESSMENT:   CLINICAL IMPRESSION: Pt was able to complete all prescribed exercises with no adverse effect, but does continued to be limited by L shoulder pain. Therapy today incorporated tests/measures regarding new referral for chronic LBP. She is well below age related norms for both the 30 Second Sit to Stand and 2MWT, indicating she is operating below desired and safe level of functional mobility. She continues to benefit from skilled PT services working on improving DNF endurance, periscapular strength, and now core and proximal hip strengthening as well to reduce neck and LBP.     GOALS: Goals reviewed with patient? No   SHORT TERM GOALS:   STG Name Target Date Goal status  1 Pt will be compliant and knowledgeable with initial HEP for improved comfort and carryover Baseline: initial HEP given 05/02/2021 MET  2 Pt will self report neck and L UE pain no greater than 6/10 for improved comfort and functional ability Baseline: 8/10 at worst 05/02/2021 ONGOING    LONG TERM GOALS:    LTG Name Target Date Goal status  1 Pt will improve FOTO  function score to no less than 67% as proxy for functional improvement Baseline: 59% function 07/04/2021 ONGOING  2 Pt will self report neck and L UE pain no greater than 2/10 for improved comfort and functional ability Baseline: 8/10 at worst 07/04/2021 ONGOING  3 Pt will improve R cervical rotation to no less than 60 deg for improved functional ability Baseline: 48 deg 07/04/2021 ONGOING  4 Pt will improve 2MWT to 140 meters to be more in line with age related norm for improved functional ability Baseline: 44.61m4/02/2022 ONGOING  5 Pt will increase reps in 30 Second Sit to Stand to no less than 7 for improved balance and functional mobility Baseline: 4 reps (MCID 2 reps) 07/04/2021 ONGOING    PLAN: PT FREQUENCY: 2x/week   PT DURATION: 12 weeks   PLANNED INTERVENTIONS: Therapeutic exercises, Therapeutic activity, Neuro Muscular re-education, Balance training, Gait training, Patient/Family education, Joint mobilization, Aquatic Therapy, Dry Needling, Cryotherapy, Moist heat, and Manual therapy   PLAN FOR NEXT SESSION: perform FOTO; assess response to new HEP for lower back; progress periscapular and DNF exercises, assess response to manual/HMP    DWard Chatters PT 05/02/21 4:01 PM

## 2021-05-04 ENCOUNTER — Encounter: Payer: Self-pay | Admitting: Family Medicine

## 2021-05-04 ENCOUNTER — Ambulatory Visit: Payer: Self-pay

## 2021-05-04 ENCOUNTER — Ambulatory Visit (INDEPENDENT_AMBULATORY_CARE_PROVIDER_SITE_OTHER): Payer: Medicare Other | Admitting: Family Medicine

## 2021-05-04 VITALS — BP 128/82 | Ht 62.5 in | Wt 229.0 lb

## 2021-05-04 DIAGNOSIS — M5412 Radiculopathy, cervical region: Secondary | ICD-10-CM

## 2021-05-04 DIAGNOSIS — M7918 Myalgia, other site: Secondary | ICD-10-CM

## 2021-05-04 MED ORDER — BACLOFEN 10 MG PO TABS: 5.0000 mg | ORAL_TABLET | Freq: Two times a day (BID) | ORAL | 1 refills | Status: DC | PRN

## 2021-05-04 MED ORDER — METHYLPREDNISOLONE ACETATE 40 MG/ML IJ SUSP
40.0000 mg | Freq: Once | INTRAMUSCULAR | Status: AC
  Administered 2021-05-04: 40 mg via INTRAMUSCULAR

## 2021-05-04 NOTE — Progress Notes (Signed)
Elizabeth Barron - 76 y.o. female MRN [id number]  Date of birth: 1945-04-25  SUBJECTIVE:  Including CC & ROS.  No chief complaint on file.   Elizabeth Barron is a 76 y.o. female that is presenting with worsening of her periscapular pain.  She still has the intermittent pain that extends down to the hand.  Has been through physical therapy.   Review of Systems See HPI   HISTORY: Past Medical, Surgical, Social, and Family History Reviewed & Updated per EMR.   Pertinent Historical Findings include:  Past Medical History:  Diagnosis Date   Arthritis    knees. back   Colon polyps    GERD (gastroesophageal reflux disease)    Glaucoma    Headache    Hypercholesteremia    Hypothyroidism    thyroid removed   Insomnia    Low back pain    Obesity    Osteoarthritis    r hip, left hip   Restless leg syndrome     Past Surgical History:  Procedure Laterality Date   ANTERIOR CRUCIATE LIGAMENT REPAIR     BREAST LUMPECTOMY     L breast- benign   BUNIONECTOMY Left 2012   COLONOSCOPY WITH PROPOFOL N/A 10/11/2014   Procedure: COLONOSCOPY WITH PROPOFOL;  Surgeon: Garlan Fair, MD;  Location: [facility];  Service: Endoscopy;  Laterality: N/A;   KNEE SURGERY     bilateral scope surgery   MAXIMUM ACCESS (MAS)POSTERIOR LUMBAR INTERBODY FUSION (PLIF) 2 LEVEL N/A 12/06/2015   Procedure: Lumbar four-five, Lumbar five-Sacral one MAXIMUM ACCESS POSTERIOR LUMBAR INTERBODY FUSION   ;  Surgeon: Eustace Moore, MD;  Location: [place] [facility];  Service: Neurosurgery;  Laterality: N/A;   RADIOLOGY WITH ANESTHESIA Left 09/05/2016   Procedure: MRI OF LEFT HIP WITHOUT;  Surgeon: Radiologist, Medication, MD;  Location: [place];  Service: Radiology;  Laterality: Left;   THYROIDECTOMY     TONSILLECTOMY  1955   child   TUBAL LIGATION     VAGINAL HYSTERECTOMY  1981     PHYSICAL EXAM:  VS: BP 128/82 (BP Location: Left Arm, Patient Position: Sitting)    Ht 5' 2.5" (1.588 m)    Wt 229 lb (103.9 kg)    BMI  41.22 kg/m  Physical Exam Gen: NAD, alert, cooperative with exam, well-appearing MSK:  Neurovascularly intact    Limited ultrasound: Left trapezius:  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  This is benign in appearance  Summary: Soft tissue nodule.  Ultrasound and interpretation by Clearance Coots, MD   Aspiration/Injection Procedure Note Elizabeth Barron Jun 15, 1945  Procedure: Injection Indications: Left periscapular pain  Procedure Details Consent: Risks of procedure as well as the alternatives and risks of each were explained to the (patient/caregiver).  Consent for procedure obtained. Time Out: Verified patient identification, verified procedure, site/side was marked, verified correct patient position, special equipment/implants available, medications/allergies/relevent history reviewed, required imaging and test results available.  Performed.  The area was cleaned with iodine and alcohol swabs.    The left trapezius, infraspinatus, supraspinatus  trigger points was injected using 1 cc's of 40 mg Depo-Medrol and 4 cc's of 0.25% bupivacaine with a 25 1 1/2" needle.  Ultrasound was used. Images were obtained in short views showing the injection.     A sterile dressing was applied.  Patient did tolerate procedure well.    ASSESSMENT & PLAN:   Cervical radiculopathy Acutely occurring.  Pain is still ongoing on the left arm. -Counseled on  home exercise therapy and supportive care. -Could consider further imaging.  Myofascial pain on left side Acutely occurring.  Having pain in trapezius and periscapular region. -Counseled on home exercise therapy and supportive care. -Trigger point injections today. -Continue physical therapy.

## 2021-05-04 NOTE — Assessment & Plan Note (Signed)
Acutely occurring.  Pain is still ongoing on the left arm. -Counseled on home exercise therapy and supportive care. -Could consider further imaging.

## 2021-05-04 NOTE — Assessment & Plan Note (Signed)
Acutely occurring.  Having pain in trapezius and periscapular region. -Counseled on home exercise therapy and supportive care. -Trigger point injections today. -Continue physical therapy.

## 2021-05-04 NOTE — Patient Instructions (Signed)
Good to see you Please use heat  Please try the baclofen   Please send me a message in MyChart with any questions or updates.  Please see me back in 4 weeks.   --Dr. Raeford Razor

## 2021-05-07 DIAGNOSIS — H43813 Vitreous degeneration, bilateral: Secondary | ICD-10-CM | POA: Diagnosis not present

## 2021-05-07 DIAGNOSIS — H35451 Secondary pigmentary degeneration, right eye: Secondary | ICD-10-CM | POA: Diagnosis not present

## 2021-05-09 ENCOUNTER — Other Ambulatory Visit: Payer: Self-pay

## 2021-05-09 ENCOUNTER — Ambulatory Visit: Payer: Medicare Other

## 2021-05-09 ENCOUNTER — Ambulatory Visit
Admission: RE | Admit: 2021-05-09 | Discharge: 2021-05-09 | Disposition: A | Discharge status: Home / Self Care | Payer: Medicare Other | Source: Ambulatory Visit | Attending: Internal Medicine | Admitting: Internal Medicine

## 2021-05-09 DIAGNOSIS — M6281 Muscle weakness (generalized): Secondary | ICD-10-CM | POA: Diagnosis not present

## 2021-05-09 DIAGNOSIS — G8929 Other chronic pain: Secondary | ICD-10-CM | POA: Diagnosis not present

## 2021-05-09 DIAGNOSIS — M545 Low back pain, unspecified: Secondary | ICD-10-CM

## 2021-05-09 DIAGNOSIS — Z1231 Encounter for screening mammogram for malignant neoplasm of breast: Secondary | ICD-10-CM

## 2021-05-09 DIAGNOSIS — M25512 Pain in left shoulder: Secondary | ICD-10-CM

## 2021-05-09 DIAGNOSIS — R2689 Other abnormalities of gait and mobility: Secondary | ICD-10-CM

## 2021-05-09 DIAGNOSIS — M542 Cervicalgia: Secondary | ICD-10-CM

## 2021-05-09 DIAGNOSIS — G4733 Obstructive sleep apnea (adult) (pediatric): Secondary | ICD-10-CM | POA: Diagnosis not present

## 2021-05-09 NOTE — Therapy (Signed)
OUTPATIENT PHYSICAL THERAPY TREATMENT NOTE/RE-EVALUATION   Patient Name: Elizabeth Barron MRN: [id number] DOB:[birth year], 76 y.o., female Today's Date: 05/09/2021  PCP: Seward Carol, MD REFERRING PROVIDER: Rosemarie Ax, MD   PT End of Session - 05/09/21 1214     Visit Number 6    Number of Visits 26    Date for PT Re-Evaluation 07/04/21    Authorization Type [employer] Medicare - FOTO 6th and 10th    PT Start Time 1215    PT Stop Time 1256   additional 10 min MHP on cervical   PT Time Calculation (min) 41 min    Activity Tolerance Patient limited by pain    Behavior During Therapy [facility] for tasks assessed/performed               Past Medical History:  Diagnosis Date   Arthritis    knees. back   Colon polyps    GERD (gastroesophageal reflux disease)    Glaucoma    Headache    Hypercholesteremia    Hypothyroidism    thyroid removed   Insomnia    Low back pain    Obesity    Osteoarthritis    r hip, left hip   Restless leg syndrome    Past Surgical History:  Procedure Laterality Date   ANTERIOR CRUCIATE LIGAMENT REPAIR     BREAST LUMPECTOMY     L breast- benign   BUNIONECTOMY Left 2012   COLONOSCOPY WITH PROPOFOL N/A 10/11/2014   Procedure: COLONOSCOPY WITH PROPOFOL;  Surgeon: Garlan Fair, MD;  Location: [facility];  Service: Endoscopy;  Laterality: N/A;   KNEE SURGERY     bilateral scope surgery   MAXIMUM ACCESS (MAS)POSTERIOR LUMBAR INTERBODY FUSION (PLIF) 2 LEVEL N/A 12/06/2015   Procedure: Lumbar four-five, Lumbar five-Sacral one MAXIMUM ACCESS POSTERIOR LUMBAR INTERBODY FUSION   ;  Surgeon: Eustace Moore, MD;  Location: [place] [facility];  Service: Neurosurgery;  Laterality: N/A;   RADIOLOGY WITH ANESTHESIA Left 09/05/2016   Procedure: MRI OF LEFT HIP WITHOUT;  Surgeon: Radiologist, Medication, MD;  Location: [place];  Service: Radiology;  Laterality: Left;   THYROIDECTOMY     TONSILLECTOMY  1955   child   TUBAL LIGATION     VAGINAL HYSTERECTOMY  1981    Patient Active Problem List   Diagnosis Date Noted   Myofascial pain on left side 05/04/2021   Cervical radiculopathy 03/28/2021   S/P lumbar spinal fusion 12/06/2015   Cough 11/28/2011  PCP: Seward Carol, MD   REFERRING PROVIDER: Rosemarie Ax, MD   REFERRING DIAG: [phone] (ICD-10-CM) - Cervical radiculopathy  THERAPY DIAG:  Cervicalgia  Acute pain of left shoulder  Muscle weakness (generalized)  Other abnormalities of gait and mobility  Chronic bilateral low back pain, unspecified whether sciatica present  ONSET DATE: December 2022    SUBJECTIVE:  Pt presents to PT with continued reports of continued severe L shoulder pain. Pt is ready to begin PT treatment at this time.  Pain: Are you having pain? Yes NPRS: 9/10 Pain Location: left shoulder, lower back PAIN TYPE: aching Pain description: intermittent  Aggravating factors: reaching, driving Relieving factors: heat   (OBJECTIVE:    PATIENT SURVEYS:  FOTO 59% function; 67% predicted   CERVICAL AROM/PROM   A/PROM A/PROM (deg) 04/11/2021  Flexion    Extension    Right lateral flexion    Left lateral flexion    Right rotation 48  Left rotation 60   (Blank rows = not tested)  UE AROM/PROM:   A/PROM Right 04/11/2021 Left 04/11/2021  Shoulder flexion   150  Shoulder extension      Shoulder abduction   80 p!  Shoulder adduction      Shoulder extension      Shoulder internal rotation      Shoulder external rotation      Elbow flexion      Elbow extension      Wrist flexion      Wrist extension      Wrist ulnar deviation      Wrist radial deviation      Wrist pronation      Wrist supination       (Blank rows = not tested)   UPPER EXTREMITY MMT:   MMT Right 04/11/2021 Left 04/11/2021  Shoulder flexion 5/5 3/5  Shoulder abduction (C5)   2+/5  Shoulder ER      Shoulder IR      Middle trapezius      Lower trapezius      Shoulder extension      Grip strength 10# 20#    (Blank rows =  not tested, score listed is out of 5 possible points.  N = WNL, D = diminished, C = clear for gross weakness with myotome testing, * = concordant pain with testing)    CERVICAL SPECIAL TESTS:  Upper limb tension test (ULTT): Negative and Spurling's test: Negative     FUNCTIONAL TESTS:  30 Second Sit to Stand: 4 reps 2MWT: 164f (44.8 meters)    TODAY'S TREATMENT:  [facility] Adult PT Treatment:                                                DATE: 05/09/2021 Therapeutic Exercise: NuStep lvl 5 UE/LE x 4 min while taking subjective Supine clamshell 2x10 GTB  Supine PPT x 10 - 5" Supine PPT w/ ball squeeze 2x10  Supine chin tuck x 10 - 5" Supine horizontal abd 2x10 GTB Supine serratus punch 2x10 2# L Manual Therapy: (pt in supine) Suboccipital release Positional release bilateral upper traps Supine PA glides C7-T2 Modalities: HMP x 10 min, cervical   [facility] Adult PT Treatment:                                                DATE: 05/02/2021 Therapeutic Exercise: Supine clamshell x 10 GTB  Supine PPT x 10 - 5" Supine chin tuck x 10 - 5" Supine chin tuck with rotation 2x5 Supine horizontal abd 2x10 YTB Supine D2 L UE flexion with YTB Seated scapular retraction x 10  Therapeutic Activity: Assessment of tests/measures and outcomes for new referral and re-eval for chronic LBP Modalities: HMP x 10 min, cervical   [facility] Adult PT Treatment:                                                DATE: 04/30/2021 Therapeutic Exercise: NuStep lvl 6 UE/LE x 5 min while taking subjective  Seated scapular retraction x 10 - 3" hold Seated row GTB 2x10 Supine chin tuck x 10- 5" over towel  Supine horizontal abduction GTB 2 x 10 Supine D2 flexion L UE 2x10 RTB Manual Therapy: (pt in supine) Manual cervical traction Positional release L/R upper trap STM L upper trap Trigger point release (manual) L upper trap Modalities: HMP x 10 min, cervical   PATIENT EDUCATION:  Education details: new HEP for lower  back Person educated: Patient Education method: Explanation, Demonstration, and Handouts Education comprehension: verbalized understanding and returned demonstration   HOME EXERCISE PROGRAM: Access Code: [email or web address] URL: [email or web address] Date: 05/02/2021 Prepared by: Octavio Manns  Exercises Seated Upper Trapezius Stretch (Mirrored) - 2 x daily - 7 x weekly - 3 reps - 20-30 sec hold Seated Scapular Retraction - 2 x daily - 7 x weekly - 3 sets - 10 reps - 3 sec hold Seated Cervical Retraction - 2 x daily - 7 x weekly - 2 sets - 10 reps - 3 sec hold Supine Chin Tuck - 2 x daily - 7 x weekly - 2 sets - 10 reps - 3 sec hold Seated Shoulder Row with Anchored Resistance - 2 x daily - 7 x weekly - 2-3 sets - 10 reps - 3-5 hold Hooklying Clamshell with Resistance - 1 x daily - 7 x weekly - 2 sets - 10 reps - green theraband hold Supine Posterior Pelvic Tilt - 1 x daily - 7 x weekly - 2 sets - 10 reps - 5 sec hold   ASSESSMENT: Pt tolerated treatment fair, but continues to be limited secondary to L shoulder and periscapular pain. Therapy today focused on improving core/proximal hip and proximal shoulder strength for decreasing pain. She responded well to manual therapy interventions, noting slight reduction in pain post. PT will continue to progress exercises as tolerated. Also discussed TPDN with pt, will potentially attempt in future sessions.      GOALS: Goals reviewed with patient? No   SHORT TERM GOALS:   STG Name Target Date Goal status  1 Pt will be compliant and knowledgeable with initial HEP for improved comfort and carryover Baseline: initial HEP given 05/02/2021 MET  2 Pt will self report neck and L UE pain no greater than 6/10 for improved comfort and functional ability Baseline: 8/10 at worst 05/02/2021 ONGOING    LONG TERM GOALS:    LTG Name Target Date Goal status  1 Pt will improve FOTO function score to no less than 67% as proxy for functional  improvement Baseline: 59% function 07/04/2021 ONGOING  2 Pt will self report neck and L UE pain no greater than 2/10 for improved comfort and functional ability Baseline: 8/10 at worst 07/04/2021 ONGOING  3 Pt will improve R cervical rotation to no less than 60 deg for improved functional ability Baseline: 48 deg 07/04/2021 ONGOING  4 Pt will improve 2MWT to 140 meters to be more in line with age related norm for improved functional ability Baseline: 44.4m4/02/2022 ONGOING  5 Pt will increase reps in 30 Second Sit to Stand to no less than 7 for improved balance and functional mobility Baseline: 4 reps (MCID 2 reps) 07/04/2021 ONGOING    PLAN: PT FREQUENCY: 2x/week   PT DURATION: 12 weeks   PLANNED INTERVENTIONS: Therapeutic exercises, Therapeutic activity, Neuro Muscular re-education, Balance training, Gait training, Patient/Family education, Joint mobilization, Aquatic Therapy, Dry Needling, Cryotherapy, Moist heat, and Manual therapy   PLAN FOR NEXT SESSION: perform FOTO; assess response to new HEP for lower back; progress periscapular and DNF exercises, assess response to manual/HMP    DWard Chatters PT 05/09/21  2:19 PM

## 2021-05-10 ENCOUNTER — Telehealth: Payer: Self-pay | Admitting: Family Medicine

## 2021-05-10 ENCOUNTER — Other Ambulatory Visit: Payer: Self-pay | Admitting: Internal Medicine

## 2021-05-10 DIAGNOSIS — M5412 Radiculopathy, cervical region: Secondary | ICD-10-CM

## 2021-05-10 DIAGNOSIS — R928 Other abnormal and inconclusive findings on diagnostic imaging of breast: Secondary | ICD-10-CM

## 2021-05-10 NOTE — Telephone Encounter (Signed)
Having ongoing pain. Positive spurling's test. Weakness with left hand grip. Pursue MRI of the cervical spine for nerve impingement and consideration of epidural. Has been under 6 weeks or physician directed home exercise therapy. Cervical xrays have been unrevealing.   Rosemarie Ax, MD [facility] 05/10/2021, 1:39 PM

## 2021-05-10 NOTE — Telephone Encounter (Signed)
Pt cld states injection gvn 2/10 lasted only temporary, the pain returned w/ full intensity. ---Patient says an MRI was discussed @ [facility] .  ---Patient ask doctor to put in order for MRI to find out reason for pain. ---Forwarding request to med asst for review w/provider.  -glh

## 2021-05-11 ENCOUNTER — Ambulatory Visit: Payer: Medicare Other

## 2021-05-13 ENCOUNTER — Encounter: Payer: Self-pay | Admitting: Family Medicine

## 2021-05-14 ENCOUNTER — Ambulatory Visit: Payer: Medicare Other

## 2021-05-15 ENCOUNTER — Other Ambulatory Visit (HOSPITAL_BASED_OUTPATIENT_CLINIC_OR_DEPARTMENT_OTHER): Payer: Self-pay

## 2021-05-15 ENCOUNTER — Ambulatory Visit (INDEPENDENT_AMBULATORY_CARE_PROVIDER_SITE_OTHER): Payer: Medicare Other | Admitting: Family Medicine

## 2021-05-15 ENCOUNTER — Encounter: Payer: Self-pay | Admitting: Family Medicine

## 2021-05-15 VITALS — BP 120/74 | Ht 62.5 in | Wt 229.0 lb

## 2021-05-15 DIAGNOSIS — M160 Bilateral primary osteoarthritis of hip: Secondary | ICD-10-CM

## 2021-05-15 DIAGNOSIS — M5416 Radiculopathy, lumbar region: Secondary | ICD-10-CM | POA: Insufficient documentation

## 2021-05-15 DIAGNOSIS — Z981 Arthrodesis status: Secondary | ICD-10-CM

## 2021-05-15 DIAGNOSIS — M5412 Radiculopathy, cervical region: Secondary | ICD-10-CM | POA: Diagnosis not present

## 2021-05-15 MED ORDER — HYDROCODONE-ACETAMINOPHEN 5-325 MG PO TABS
1.0000 | ORAL_TABLET | Freq: Three times a day (TID) | ORAL | 0 refills | Status: DC | PRN
  Filled 2021-05-15: qty 15, 5d supply, fill #0

## 2021-05-15 MED ORDER — KETOROLAC TROMETHAMINE 30 MG/ML IJ SOLN
30.0000 mg | Freq: Once | INTRAMUSCULAR | Status: AC
  Administered 2021-05-15: 30 mg via INTRAMUSCULAR

## 2021-05-15 MED ORDER — HYDROCODONE-ACETAMINOPHEN 5-325 MG PO TABS: 1.0000 | ORAL_TABLET | Freq: Three times a day (TID) | ORAL | 0 refills | Status: DC | PRN

## 2021-05-15 NOTE — Progress Notes (Signed)
Elizabeth Barron - 76 y.o. female MRN [id number]  Date of birth: October 31, 1945  SUBJECTIVE:  Including CC & ROS.  No chief complaint on file.   Elizabeth Barron is a 76 y.o. female that is presenting with acute on chronic neck pain with left-sided radicular pain down her right arm.  She is also presenting with acute on chronic low back pain with left-sided radicular pain down the left leg.  She has a history of lumbar fusion.  She is having weakness in the leg.   Review of Systems See HPI   HISTORY: Past Medical, Surgical, Social, and Family History Reviewed & Updated per EMR.   Pertinent Historical Findings include:  Past Medical History:  Diagnosis Date   Arthritis    knees. back   Colon polyps    GERD (gastroesophageal reflux disease)    Glaucoma    Headache    Hypercholesteremia    Hypothyroidism    thyroid removed   Insomnia    Low back pain    Obesity    Osteoarthritis    r hip, left hip   Restless leg syndrome     Past Surgical History:  Procedure Laterality Date   ANTERIOR CRUCIATE LIGAMENT REPAIR     BREAST LUMPECTOMY     L breast- benign   BUNIONECTOMY Left 2012   COLONOSCOPY WITH PROPOFOL N/A 10/11/2014   Procedure: COLONOSCOPY WITH PROPOFOL;  Surgeon: Garlan Fair, MD;  Location: [facility];  Service: Endoscopy;  Laterality: N/A;   KNEE SURGERY     bilateral scope surgery   MAXIMUM ACCESS (MAS)POSTERIOR LUMBAR INTERBODY FUSION (PLIF) 2 LEVEL N/A 12/06/2015   Procedure: Lumbar four-five, Lumbar five-Sacral one MAXIMUM ACCESS POSTERIOR LUMBAR INTERBODY FUSION   ;  Surgeon: Eustace Moore, MD;  Location: [place] [facility];  Service: Neurosurgery;  Laterality: N/A;   RADIOLOGY WITH ANESTHESIA Left 09/05/2016   Procedure: MRI OF LEFT HIP WITHOUT;  Surgeon: Radiologist, Medication, MD;  Location: [place];  Service: Radiology;  Laterality: Left;   THYROIDECTOMY     TONSILLECTOMY  1955   child   TUBAL LIGATION     VAGINAL HYSTERECTOMY  1981     PHYSICAL EXAM:   VS: BP 120/74 (BP Location: Left Arm, Patient Position: Sitting)    Ht 5' 2.5" (1.588 m)    Wt 229 lb (103.9 kg)    BMI 41.22 kg/m  Physical Exam Gen: NAD, alert, cooperative with exam, well-appearing MSK:  Neck/left arm: Limited range of motion lateral rotation to the right. Positive Spurling's test. Weakness with grip strength in the left hand. Diminished reflexes in the left arm compared to the right arm Back/left leg: Unable to bear weight without significant pain. Positive straight leg raise on the left. Weakness in the left leg compared to the right. Diminished reflexes in the left leg compared to the right Neurovascularly intact       ASSESSMENT & PLAN:   Cervical radiculopathy Acutely worsening.  Still having pain despite modalities today.  We have tried over 6 weeks of physician directed home exercise therapy starting 1/4.  We have tried medications and injection. -Counseled on home exercise therapy and supportive care. -Counseled on baclofen. -Counseled on Aleve. -Cervical soft collar. -Toradol injection. -MRI with general sedation to evaluate for nerve impingement and consideration of epidural.  Lumbar radiculopathy Acutely occurring.  She does have a history of lumbar fusion with previous CT scan showing concern for loosening of hardware.  She is having weakness on exam  today with significant pain and unable to bear weight with walking distance. -Counseled on home exercise therapy and supportive care. -Refilled Norco. -CT of the lumbar spine with myelogram to evaluate for nerve impingement or loosening of the surgical hardware.  For the consideration of presurgical planning or epidural.  Primary osteoarthritis of both hips This was demonstrated in previous imaging.  She does have some lateral hip pain that could be associated with weakness versus degenerative changes of her hips. -Could consider injection.  S/P lumbar spinal fusion Previous surgery with concern  for loosening hardware.  She reports worsening back pain and weakness in the left leg. -CT lumbar spine myelogram to evaluate for loosening hardware.

## 2021-05-15 NOTE — Assessment & Plan Note (Signed)
This was demonstrated in previous imaging.  She does have some lateral hip pain that could be associated with weakness versus degenerative changes of her hips. -Could consider injection.

## 2021-05-15 NOTE — Assessment & Plan Note (Addendum)
Acutely occurring.  She does have a history of lumbar fusion with previous CT scan showing concern for loosening of hardware.  She is having weakness on exam today with significant pain and unable to bear weight with walking distance. -Counseled on home exercise therapy and supportive care. -Refilled Norco. -CT of the lumbar spine with myelogram to evaluate for nerve impingement or loosening of the surgical hardware.  For the consideration of presurgical planning or epidural.

## 2021-05-15 NOTE — Addendum Note (Signed)
Addended by: Rosemarie Ax on: 05/15/2021 09:19 AM   Modules accepted: Orders

## 2021-05-15 NOTE — Assessment & Plan Note (Signed)
Previous surgery with concern for loosening hardware.  She reports worsening back pain and weakness in the left leg. -CT lumbar spine myelogram to evaluate for loosening hardware.

## 2021-05-15 NOTE — Assessment & Plan Note (Addendum)
Acutely worsening.  Still having pain despite modalities today.  We have tried over 6 weeks of physician directed home exercise therapy starting 1/4.  We have tried medications and injection. -Counseled on home exercise therapy and supportive care. -Counseled on baclofen. -Counseled on Aleve. -Cervical soft collar. -Toradol injection. -MRI with general sedation to evaluate for nerve impingement and consideration of epidural.

## 2021-05-15 NOTE — Patient Instructions (Signed)
Good to see you We'll get the MRI done at [facility]  You can call [facility] about the CT scan of the lumbar area. [phone].  You can take up to 500 mg of aleve every 12 hours.  You can take 10 mg of baclofen three times daily if needed.  You can take 10 mg of norco every 6-8 hours.  Please see if the soft collare helps Please send me a message in MyChart with any questions or updates.  We'll set up the virtual visit once we get the results in.   --Dr. Raeford Razor

## 2021-05-16 ENCOUNTER — Ambulatory Visit: Payer: Medicare Other

## 2021-05-16 DIAGNOSIS — K219 Gastro-esophageal reflux disease without esophagitis: Secondary | ICD-10-CM | POA: Diagnosis not present

## 2021-05-16 DIAGNOSIS — G4700 Insomnia, unspecified: Secondary | ICD-10-CM | POA: Diagnosis not present

## 2021-05-16 DIAGNOSIS — E7800 Pure hypercholesterolemia, unspecified: Secondary | ICD-10-CM | POA: Diagnosis not present

## 2021-05-16 DIAGNOSIS — G8929 Other chronic pain: Secondary | ICD-10-CM | POA: Diagnosis not present

## 2021-05-21 ENCOUNTER — Telehealth: Payer: Self-pay | Admitting: Family Medicine

## 2021-05-21 MED ORDER — CELECOXIB 200 MG PO CAPS: ORAL_CAPSULE | ORAL | 2 refills | Status: DC

## 2021-05-21 MED ORDER — OXYCODONE-ACETAMINOPHEN 7.5-325 MG PO TABS: 1.0000 | ORAL_TABLET | Freq: Three times a day (TID) | ORAL | 0 refills | Status: DC | PRN

## 2021-05-21 NOTE — Telephone Encounter (Signed)
Still having pain.  We will call in Percocet and Celebrex.  She is awaiting for the sedated MRI.  Advised to be placed on a cancellation list to be completed sooner.  Rosemarie Ax, MD [facility] 05/21/2021, 5:24 PM

## 2021-05-21 NOTE — Telephone Encounter (Signed)
I called pt- she states she doubled up on her baclofen and Lyrica and she was taking her Norco as prescribed on the bottle and her pain has not gotten any better. She states her MRI is scheduled 06/05/21 and she can not wait that long with the pain she is in. Please advise.

## 2021-05-21 NOTE — Telephone Encounter (Signed)
Patient called states she still in constant pain w neck & shoulder.  --Patient ask for either someone to call her re: Shoulder pain or an appt because the injection gvn on 2/21 did NOT work.  --Forwarding message to med asst.  -glh

## 2021-05-22 ENCOUNTER — Ambulatory Visit
Admission: RE | Admit: 2021-05-22 | Discharge: 2021-05-22 | Disposition: A | Discharge status: Home / Self Care | Payer: Medicare Other | Source: Ambulatory Visit | Attending: Family Medicine | Admitting: Family Medicine

## 2021-05-22 DIAGNOSIS — M4326 Fusion of spine, lumbar region: Secondary | ICD-10-CM | POA: Diagnosis not present

## 2021-05-22 DIAGNOSIS — M5416 Radiculopathy, lumbar region: Secondary | ICD-10-CM

## 2021-05-22 DIAGNOSIS — Z981 Arthrodesis status: Secondary | ICD-10-CM

## 2021-05-22 DIAGNOSIS — M4316 Spondylolisthesis, lumbar region: Secondary | ICD-10-CM | POA: Diagnosis not present

## 2021-05-22 DIAGNOSIS — M5126 Other intervertebral disc displacement, lumbar region: Secondary | ICD-10-CM | POA: Diagnosis not present

## 2021-05-22 MED ORDER — ONDANSETRON HCL 4 MG/2ML IJ SOLN
4.0000 mg | Freq: Once | INTRAMUSCULAR | Status: AC | PRN
  Administered 2021-05-22: 4 mg via INTRAMUSCULAR

## 2021-05-22 MED ORDER — MEPERIDINE HCL 50 MG/ML IJ SOLN
50.0000 mg | Freq: Once | INTRAMUSCULAR | Status: AC | PRN
  Administered 2021-05-22: 50 mg via INTRAMUSCULAR

## 2021-05-22 MED ORDER — IOPAMIDOL (ISOVUE-M 200) INJECTION 41%
20.0000 mL | Freq: Once | INTRAMUSCULAR | Status: AC
Start: 2021-05-22 — End: 2021-05-22
  Administered 2021-05-22: 20 mL via INTRATHECAL

## 2021-05-22 MED ORDER — DIAZEPAM 5 MG PO TABS
5.0000 mg | ORAL_TABLET | Freq: Once | ORAL | Status: AC
  Administered 2021-05-22: 5 mg via ORAL

## 2021-05-22 NOTE — Discharge Instr - Other Orders (Signed)
1040: pt reported pain prior to myelogram procedure. Per Dr. Laurence Ferrari, give PRN pain medication prior to starting myelo. See [facility]

## 2021-05-22 NOTE — Discharge Instructions (Signed)

## 2021-05-23 ENCOUNTER — Encounter: Payer: Self-pay | Admitting: Family Medicine

## 2021-05-23 ENCOUNTER — Ambulatory Visit: Payer: Medicare Other

## 2021-05-28 ENCOUNTER — Encounter: Payer: Self-pay | Admitting: Family Medicine

## 2021-05-28 ENCOUNTER — Telehealth (INDEPENDENT_AMBULATORY_CARE_PROVIDER_SITE_OTHER): Payer: Medicare Other | Admitting: Family Medicine

## 2021-05-28 DIAGNOSIS — M5416 Radiculopathy, lumbar region: Secondary | ICD-10-CM

## 2021-05-28 DIAGNOSIS — H40153 Residual stage of open-angle glaucoma, bilateral: Secondary | ICD-10-CM | POA: Diagnosis not present

## 2021-05-28 MED ORDER — DIAZEPAM 5 MG PO TABS: ORAL_TABLET | ORAL | 0 refills | Status: DC

## 2021-05-28 NOTE — Assessment & Plan Note (Addendum)
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  Does have significant degenerative changes in the lower aspect as well. ?-Counseled on home exercise therapy and supportive care. ?-Epidural.  Valium for procedure ?-Could consider facet injections. ?

## 2021-05-28 NOTE — Progress Notes (Signed)
Virtual Visit via Video Note ? ?I connected with Lorissa Kishbaugh Hopfer on 05/28/21 at 10:10 AM EST by a video enabled telemedicine application and verified that I am speaking with the correct person using two identifiers. ? ?Location: ?Patient: home ?Provider: office ?  ?I discussed the limitations of evaluation and management by telemedicine and the availability of in person appointments. The patient expressed understanding and agreed to proceed. ? ?History of Present Illness: ? ?Ms. Perezperez is a 76 year old female that is following up after the CT of her lumbar spine.  This was demonstrating stable posterior fusion.  She reports having right left-sided radicular pain which does seem to correlate to the L2-L3 and L3-L4 foraminal stenosis. ? ?Observations/Objective: ? ? ?Assessment and Plan: ? 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  Does have significant degenerative changes in the lower aspect as well. ?-Counseled on home exercise therapy and supportive care. ?-Epidural.  Evaluate for procedure ?-Could consider facet injections. ? ?Follow Up Instructions: ? ?  ?I discussed the assessment and treatment plan with the patient. The patient was provided an opportunity to ask questions and all were answered. The patient agreed with the plan and demonstrated an understanding of the instructions. ?  ?The patient was advised to call back or seek an in-person evaluation if the symptoms worsen or if the condition fails to improve as anticipated. ? ? ? ?Clearance Coots, MD ? ? ?

## 2021-05-30 ENCOUNTER — Telehealth: Payer: Medicare Other | Admitting: Family Medicine

## 2021-05-31 ENCOUNTER — Ambulatory Visit
Admission: RE | Admit: 2021-05-31 | Discharge: 2021-05-31 | Disposition: A | Discharge status: Home / Self Care | Payer: Medicare Other | Source: Ambulatory Visit | Attending: Internal Medicine | Admitting: Internal Medicine

## 2021-05-31 ENCOUNTER — Other Ambulatory Visit: Payer: Self-pay

## 2021-05-31 ENCOUNTER — Other Ambulatory Visit: Payer: Self-pay | Admitting: Internal Medicine

## 2021-05-31 DIAGNOSIS — R928 Other abnormal and inconclusive findings on diagnostic imaging of breast: Secondary | ICD-10-CM

## 2021-05-31 DIAGNOSIS — R922 Inconclusive mammogram: Secondary | ICD-10-CM | POA: Diagnosis not present

## 2021-05-31 DIAGNOSIS — R921 Mammographic calcification found on diagnostic imaging of breast: Secondary | ICD-10-CM

## 2021-06-01 ENCOUNTER — Ambulatory Visit
Admission: RE | Admit: 2021-06-01 | Discharge: 2021-06-01 | Disposition: A | Discharge status: Home / Self Care | Payer: Medicare Other | Source: Ambulatory Visit | Attending: Family Medicine | Admitting: Family Medicine

## 2021-06-01 ENCOUNTER — Encounter (HOSPITAL_COMMUNITY): Payer: Self-pay | Admitting: *Deleted

## 2021-06-01 ENCOUNTER — Other Ambulatory Visit: Payer: Self-pay

## 2021-06-01 ENCOUNTER — Other Ambulatory Visit: Payer: Self-pay | Admitting: Family Medicine

## 2021-06-01 ENCOUNTER — Ambulatory Visit: Payer: Medicare Other | Admitting: Family Medicine

## 2021-06-01 DIAGNOSIS — M5416 Radiculopathy, lumbar region: Secondary | ICD-10-CM

## 2021-06-01 DIAGNOSIS — M4727 Other spondylosis with radiculopathy, lumbosacral region: Secondary | ICD-10-CM | POA: Diagnosis not present

## 2021-06-01 MED ORDER — METHYLPREDNISOLONE ACETATE 40 MG/ML INJ SUSP (RADIOLOG
80.0000 mg | Freq: Once | INTRAMUSCULAR | Status: AC
  Administered 2021-06-01: 80 mg via EPIDURAL

## 2021-06-01 MED ORDER — IOPAMIDOL (ISOVUE-M 200) INJECTION 41%
1.0000 mL | Freq: Once | INTRAMUSCULAR | Status: AC
  Administered 2021-06-01: 1 mL via EPIDURAL

## 2021-06-01 NOTE — Progress Notes (Signed)
Spoke with pt for pre-op call. Pt denies cardiac history or HTN. Pt states she is pre-diabetic. Pt is not on medications and does not check her blood sugar at home. She said her most recent A1c was 5.9.  ? ?Pt's surgery is scheduled as ambulatory so no Covid test is required prior to surgery.  ?

## 2021-06-01 NOTE — Discharge Instructions (Signed)

## 2021-06-05 ENCOUNTER — Ambulatory Visit (HOSPITAL_COMMUNITY)
Admission: RE | Admit: 2021-06-05 | Discharge: 2021-06-05 | Disposition: A | Discharge status: Home / Self Care | Payer: Medicare Other | Attending: Family Medicine | Admitting: Family Medicine

## 2021-06-05 ENCOUNTER — Other Ambulatory Visit: Payer: Self-pay

## 2021-06-05 ENCOUNTER — Encounter (HOSPITAL_COMMUNITY): Admission: RE | Disposition: A | Discharge status: Home / Self Care | Payer: Self-pay | Source: Home / Self Care

## 2021-06-05 ENCOUNTER — Ambulatory Visit (HOSPITAL_COMMUNITY): Payer: Medicare Other | Admitting: Certified Registered Nurse Anesthetist

## 2021-06-05 ENCOUNTER — Ambulatory Visit (HOSPITAL_COMMUNITY)
Admission: RE | Admit: 2021-06-05 | Discharge: 2021-06-05 | Disposition: A | Discharge status: Home / Self Care | Payer: Medicare Other | Source: Ambulatory Visit | Attending: Family Medicine | Admitting: Family Medicine

## 2021-06-05 ENCOUNTER — Encounter (HOSPITAL_COMMUNITY): Payer: Self-pay

## 2021-06-05 ENCOUNTER — Ambulatory Visit (HOSPITAL_BASED_OUTPATIENT_CLINIC_OR_DEPARTMENT_OTHER): Payer: Medicare Other | Admitting: Certified Registered Nurse Anesthetist

## 2021-06-05 DIAGNOSIS — M5412 Radiculopathy, cervical region: Secondary | ICD-10-CM

## 2021-06-05 DIAGNOSIS — E039 Hypothyroidism, unspecified: Secondary | ICD-10-CM | POA: Diagnosis not present

## 2021-06-05 DIAGNOSIS — Z9989 Dependence on other enabling machines and devices: Secondary | ICD-10-CM

## 2021-06-05 DIAGNOSIS — M542 Cervicalgia: Secondary | ICD-10-CM | POA: Diagnosis not present

## 2021-06-05 DIAGNOSIS — G4733 Obstructive sleep apnea (adult) (pediatric): Secondary | ICD-10-CM

## 2021-06-05 HISTORY — DX: Prediabetes: R73.03

## 2021-06-05 HISTORY — DX: Sleep apnea, unspecified: G47.30

## 2021-06-05 LAB — BASIC METABOLIC PANEL
Anion gap: 8 (ref 5–15)
BUN: 16 mg/dL (ref 8–23)
CO2: 25 mmol/L (ref 22–32)
Calcium: 9.6 mg/dL (ref 8.9–10.3)
Chloride: 108 mmol/L (ref 98–111)
Creatinine, Ser: 0.71 mg/dL (ref 0.44–1.00)
GFR, Estimated: >60 mL/min (ref >60)
Glucose, Bld: 94 mg/dL (ref 70–99)
Potassium: 3.4 mmol/L — ABNORMAL LOW (ref 3.5–5.1)
Sodium: 141 mmol/L (ref 135–145)

## 2021-06-05 SURGERY — MRI WITH ANESTHESIA
Anesthesia: General

## 2021-06-05 MED ORDER — KETOROLAC TROMETHAMINE 15 MG/ML IJ SOLN: 15.0000 mg | Freq: Once | INTRAMUSCULAR | Status: DC | PRN

## 2021-06-05 MED ORDER — ONDANSETRON HCL 4 MG/2ML IJ SOLN
4.0000 mg | Freq: Once | INTRAMUSCULAR | Status: AC | PRN
  Administered 2021-06-05: 4 mg via INTRAVENOUS

## 2021-06-05 MED ORDER — LACTATED RINGERS IV SOLN: INTRAVENOUS | Status: DC

## 2021-06-05 MED ORDER — MIDAZOLAM HCL 5 MG/5ML IJ SOLN
INTRAMUSCULAR | Status: DC | PRN
  Administered 2021-06-05: 2 mg via INTRAVENOUS

## 2021-06-05 MED ORDER — AMISULPRIDE (ANTIEMETIC) 5 MG/2ML IV SOLN: 10.0000 mg | Freq: Once | INTRAVENOUS | Status: DC | PRN

## 2021-06-05 MED ORDER — FENTANYL CITRATE (PF) 100 MCG/2ML IJ SOLN: 25.0000 ug | INTRAMUSCULAR | Status: DC | PRN

## 2021-06-05 MED ORDER — PROPOFOL 10 MG/ML IV BOLUS
INTRAVENOUS | Status: DC | PRN
  Administered 2021-06-05: 200 mg via INTRAVENOUS

## 2021-06-05 MED ORDER — PHENYLEPHRINE 40 MCG/ML (10ML) SYRINGE FOR IV PUSH (FOR BLOOD PRESSURE SUPPORT)
PREFILLED_SYRINGE | INTRAVENOUS | Status: DC | PRN
  Administered 2021-06-05 (×4): 80 ug via INTRAVENOUS

## 2021-06-05 MED ORDER — LIDOCAINE 2% (20 MG/ML) 5 ML SYRINGE
INTRAMUSCULAR | Status: DC | PRN
  Administered 2021-06-05: 50 mg via INTRAVENOUS

## 2021-06-05 MED ORDER — CHLORHEXIDINE GLUCONATE 0.12 % MT SOLN
15.0000 mL | Freq: Once | OROMUCOSAL | Status: AC
  Administered 2021-06-05: 15 mL via OROMUCOSAL
  Filled 2021-06-05: qty 15

## 2021-06-05 MED ORDER — ACETAMINOPHEN 10 MG/ML IV SOLN: 1000.0000 mg | Freq: Once | INTRAVENOUS | Status: DC | PRN

## 2021-06-05 MED ORDER — ORAL CARE MOUTH RINSE: 15.0000 mL | Freq: Once | OROMUCOSAL | Status: AC

## 2021-06-05 NOTE — Transfer of Care (Signed)
Immediate Anesthesia Transfer of Care Note ? ?Patient: Elizabeth Barron ? ?Procedure(s) Performed: MRI WITH CERVICAL SPINE WITHOUT CONTRAST ? ?Patient Location: PACU ? ?Anesthesia Type:General ? ?Level of Consciousness: awake, alert  and oriented ? ?Airway & Oxygen Therapy: Patient Spontanous Breathing ? ?Post-op Assessment: Report given to RN and Post -op Vital signs reviewed and stable ? ?Post vital signs: Reviewed and stable ? ?Last Vitals:  ?Vitals Value Taken Time  ?BP 129/73 06/05/21 0922  ?Temp 36.4 ?C 06/05/21 0920  ?Pulse 67 06/05/21 0925  ?Resp 19 06/05/21 0925  ?SpO2 98 % 06/05/21 0925  ?Vitals shown include unvalidated device data. ? ?Last Pain:  ?Vitals:  ? 06/05/21 0655  ?TempSrc:   ?PainSc: 7   ?   ? ?  ? ?Complications: No notable events documented. ?

## 2021-06-05 NOTE — Anesthesia Preprocedure Evaluation (Addendum)
Anesthesia Evaluation  ?Patient identified by MRN, date of birth, ID band ?Patient awake ? ? ? ?Reviewed: ?Allergy & Precautions, NPO status , Patient's Chart, lab work & pertinent test results ? ?Airway ?Mallampati: II ? ?TM Distance: >3 FB ?Neck ROM: Full ? ? ? Dental ?no notable dental hx. ? ?  ?Pulmonary ?sleep apnea and Continuous Positive Airway Pressure Ventilation ,  ?  ?Pulmonary exam normal ?breath sounds clear to auscultation ? ? ? ? ? ? Cardiovascular ?negative cardio ROS ?Normal cardiovascular exam ?Rhythm:Regular Rate:Normal ? ?ECG: SR, rate 79 ?  ?Neuro/Psych ? Headaches,  Neuromuscular disease negative psych ROS  ? GI/Hepatic ?Neg liver ROS, GERD  Medicated and Controlled,  ?Endo/Other  ?Hypothyroidism Morbid obesity ? Renal/GU ?negative Renal ROS  ? ?  ?Musculoskeletal ? ?(+) Arthritis , Low back pain  ? Abdominal ?  ?Peds ? Hematology ?negative hematology ROS ?(+)   ?Anesthesia Other Findings ?CERVICAL RADICULOPATHY ? Reproductive/Obstetrics ? ?  ? ? ? ? ? ? ? ? ? ? ? ? ? ?  ?  ? ? ? ? ? ? ? ?Anesthesia Physical ?Anesthesia Plan ? ?ASA: 3 ? ?Anesthesia Plan: General  ? ?Post-op Pain Management:   ? ?Induction: Intravenous ? ?PONV Risk Score and Plan: 3 and Ondansetron, Dexamethasone, Midazolam and Treatment may vary due to age or medical condition ? ?Airway Management Planned: LMA ? ?Additional Equipment:  ? ?Intra-op Plan:  ? ?Post-operative Plan: Extubation in OR ? ?Informed Consent: I have reviewed the patients History and Physical, chart, labs and discussed the procedure including the risks, benefits and alternatives for the proposed anesthesia with the patient or authorized representative who has indicated his/her understanding and acceptance.  ? ? ? ?Dental advisory given ? ?Plan Discussed with: CRNA ? ?Anesthesia Plan Comments:   ? ? ? ? ? ?Anesthesia Quick Evaluation ? ?

## 2021-06-05 NOTE — Anesthesia Procedure Notes (Signed)
Procedure Name: LMA Insertion ?Date/Time: 06/05/2021 8:31 AM ?Performed by: Valda Favia, CRNA ?Pre-anesthesia Checklist: Patient identified, Emergency Drugs available, Suction available, Patient being monitored and Timeout performed ?Patient Re-evaluated:Patient Re-evaluated prior to induction ?Oxygen Delivery Method: Circle system utilized ?Preoxygenation: Pre-oxygenation with 100% oxygen ?Induction Type: IV induction ?LMA: LMA with gastric port inserted ?LMA Size: 4.0 ?Placement Confirmation: positive ETCO2 and breath sounds checked- equal and bilateral ?Tube secured with: Tape ?Dental Injury: Teeth and Oropharynx as per pre-operative assessment  ? ? ? ? ?

## 2021-06-05 NOTE — Anesthesia Postprocedure Evaluation (Signed)
Anesthesia Post Note ? ?Patient: Elizabeth Barron ? ?Procedure(s) Performed: MRI WITH CERVICAL SPINE WITHOUT CONTRAST ? ?  ? ?Patient location during evaluation: PACU ?Anesthesia Type: General ?Level of consciousness: awake and alert ?Pain management: pain level controlled ?Vital Signs Assessment: post-procedure vital signs reviewed and stable ?Respiratory status: spontaneous breathing, nonlabored ventilation, respiratory function stable and patient connected to nasal cannula oxygen ?Cardiovascular status: blood pressure returned to baseline and stable ?Postop Assessment: no apparent nausea or vomiting ?Anesthetic complications: no ? ? ?No notable events documented. ? ?Last Vitals:  ?Vitals:  ? 06/05/21 0935 06/05/21 0950  ?BP: 139/79 140/70  ?Pulse: 64 60  ?Resp: 20 18  ?Temp:  36.4 ?C  ?SpO2: 99% 96%  ?  ?Last Pain:  ?Vitals:  ? 06/05/21 0950  ?TempSrc:   ?PainSc: 0-No pain  ? ? ?  ?  ?  ?  ?  ?  ? ?Jobany Montellano L Anakin Varkey ? ? ? ? ?

## 2021-06-05 NOTE — H&P (Signed)
Anesthesia H&P Update: History and Physical Exam reviewed; patient is OK for planned anesthetic and procedure. ? ?

## 2021-06-06 ENCOUNTER — Encounter (HOSPITAL_COMMUNITY): Payer: Self-pay | Admitting: Radiology

## 2021-06-07 ENCOUNTER — Encounter: Payer: Self-pay | Admitting: Family Medicine

## 2021-06-07 ENCOUNTER — Ambulatory Visit (INDEPENDENT_AMBULATORY_CARE_PROVIDER_SITE_OTHER): Payer: Medicare Other | Admitting: Family Medicine

## 2021-06-07 ENCOUNTER — Ambulatory Visit: Payer: Self-pay

## 2021-06-07 ENCOUNTER — Telehealth (INDEPENDENT_AMBULATORY_CARE_PROVIDER_SITE_OTHER): Payer: Medicare Other | Admitting: Family Medicine

## 2021-06-07 VITALS — BP 130/76 | Ht 62.5 in | Wt 222.0 lb

## 2021-06-07 DIAGNOSIS — G4733 Obstructive sleep apnea (adult) (pediatric): Secondary | ICD-10-CM | POA: Diagnosis not present

## 2021-06-07 DIAGNOSIS — M5412 Radiculopathy, cervical region: Secondary | ICD-10-CM | POA: Diagnosis not present

## 2021-06-07 DIAGNOSIS — M7918 Myalgia, other site: Secondary | ICD-10-CM | POA: Diagnosis not present

## 2021-06-07 NOTE — Patient Instructions (Signed)
Good to see you ?Please use heat   ?Please send me a message in MyChart with any questions or updates.  ?Please see me back in 3-4 weeks.  ? ?--Dr. Raeford Razor ? ?

## 2021-06-07 NOTE — Assessment & Plan Note (Signed)
Having more periscapular thoracic pain on the right.  Her left-sided pain is improved. ?-Counseled on home exercise therapy and supportive care. ?-Pursue trigger point injections. ?

## 2021-06-07 NOTE — Progress Notes (Signed)
Virtual Visit via Video Note ? ?I connected with Elizabeth Barron on 06/07/21 at  1:30 PM EDT by a video enabled telemedicine application and verified that I am speaking with the correct person using two identifiers. ? ?Location: ?Patient: home ?Provider: office ?  ?I discussed the limitations of evaluation and management by telemedicine and the availability of in person appointments. The patient expressed understanding and agreed to proceed. ? ?History of Present Illness: ? ?Ms. Elizabeth Barron is a 76 year old female that is following up after the MRI of her cervical spine.  She reports having more periscapular pain on the right side currently.  The MRI was demonstrating mild neuroforaminal narrowing at C5-6 and C6/7 bilaterally.  Mainly on the right at C7-T1. ?  ?Observations/Objective: ? ? ?Assessment and Plan: ? ?Cervical radiculopathy: ?Having more periscapular thoracic pain on the right.  Her left-sided pain is improved. ?-Counseled on home exercise therapy and supportive care. ?-Pursue trigger point injections. ? ?Follow Up Instructions: ? ?  ?I discussed the assessment and treatment plan with the patient. The patient was provided an opportunity to ask questions and all were answered. The patient agreed with the plan and demonstrated an understanding of the instructions. ?  ?The patient was advised to call back or seek an in-person evaluation if the symptoms worsen or if the condition fails to improve as anticipated. ? ? ? ?Clearance Coots, MD ? ? ?

## 2021-06-07 NOTE — Progress Notes (Signed)
?ELYCIA WOODSIDE - 76 y.o. female MRN [id number]  Date of birth: [birth year] ? ?SUBJECTIVE:  Including CC & ROS.  ?No chief complaint on file. ? ? ?MYKA HITZ is a 76 y.o. female that is presenting for trigger point injections. ? ? ? ?Review of Systems ?See HPI  ? ?HISTORY: Past Medical, Surgical, Social, and Family History Reviewed & Updated per EMR.   ?Pertinent Historical Findings include: ? ?Past Medical History:  ?Diagnosis Date  ? Arthritis   ? knees. back  ? Colon polyps   ? GERD (gastroesophageal reflux disease)   ? Glaucoma   ? Headache   ? Hypercholesteremia   ? Hypothyroidism   ? thyroid removed  ? Insomnia   ? Low back pain   ? Obesity   ? Osteoarthritis   ? r hip, left hip  ? Pre-diabetes   ? Restless leg syndrome   ? Sleep apnea   ? uses cpap  ? ? ?Past Surgical History:  ?Procedure Laterality Date  ? ANTERIOR CRUCIATE LIGAMENT REPAIR    ? BREAST LUMPECTOMY    ? L breast- benign  ? BUNIONECTOMY Left 2012  ? COLONOSCOPY WITH PROPOFOL N/A 10/11/2014  ? Procedure: COLONOSCOPY WITH PROPOFOL;  Surgeon: Garlan Fair, MD;  Location: [facility];  Service: Endoscopy;  Laterality: N/A;  ? KNEE SURGERY    ? bilateral scope surgery  ? MAXIMUM ACCESS (MAS)POSTERIOR LUMBAR INTERBODY FUSION (PLIF) 2 LEVEL N/A 12/06/2015  ? Procedure: Lumbar four-five, Lumbar five-Sacral one MAXIMUM ACCESS POSTERIOR LUMBAR INTERBODY FUSION   ;  Surgeon: Eustace Moore, MD;  Location: [place] [facility];  Service: Neurosurgery;  Laterality: N/A;  ? RADIOLOGY WITH ANESTHESIA Left 09/05/2016  ? Procedure: MRI OF LEFT HIP WITHOUT;  Surgeon: Radiologist, Medication, MD;  Location: [place];  Service: Radiology;  Laterality: Left;  ? RADIOLOGY WITH ANESTHESIA N/A 06/05/2021  ? Procedure: MRI WITH CERVICAL SPINE WITHOUT CONTRAST;  Surgeon: Radiologist, Medication, MD;  Location: [place];  Service: Radiology;  Laterality: N/A;  ? THYROIDECTOMY    ? TONSILLECTOMY  1955  ? child  ? TUBAL LIGATION    ? VAGINAL HYSTERECTOMY  1981  ? ? ? ?PHYSICAL EXAM:   ?VS: BP 130/76 (BP Location: Left Arm, Patient Position: Sitting)   Ht 5' 2.5" (1.588 m)   Wt 222 lb (100.7 kg)   BMI 39.96 kg/m?  ?Physical Exam ?Gen: NAD, alert, cooperative with exam, well-appearing ?MSK:  ?Neurovascularly intact   ? ? ?Aspiration/Injection Procedure Note ?Meosha Castanon Lund ?1945/08/01 ? ?Procedure: Injection ?Indications: right periscapular pain  ? ?Procedure Details ?Consent: Risks of procedure as well as the alternatives and risks of each were explained to the (patient/caregiver).  Consent for procedure obtained. ?Time Out: Verified patient identification, verified procedure, site/side was marked, verified correct patient position, special equipment/implants available, medications/allergies/relevent history reviewed, required imaging and test results available.  Performed.  The area was cleaned with iodine and alcohol swabs.   ? ?The right trapezius, rhomboids, longissimus and sartorius on the right and was injected using 2 cc's of 1% lidocaine and 3 cc's of 0.25% bupivacaine with a 25 1 1/2" needle.  Ultrasound was used. Images were obtained in short views showing the injection.   ? ? ?A sterile dressing was applied. ? ?Patient did tolerate procedure well. ? ? ? ? ?ASSESSMENT & PLAN:  ? ?Myofascial pain on right side ?Acutely occurring.  Having more pain at the inferior medial border of the scapula. ?-Counseled on home exercise therapy and  supportive care. ?-Trigger point injections today.  ? ? ? ? ?

## 2021-06-07 NOTE — Assessment & Plan Note (Signed)
Acutely occurring.  Having more pain at the inferior medial border of the scapula. ?-Counseled on home exercise therapy and supportive care. ?-Trigger point injections today.  ?

## 2021-06-08 ENCOUNTER — Ambulatory Visit
Admission: RE | Admit: 2021-06-08 | Discharge: 2021-06-08 | Disposition: A | Discharge status: Home / Self Care | Payer: Medicare Other | Source: Ambulatory Visit | Attending: Internal Medicine | Admitting: Internal Medicine

## 2021-06-08 ENCOUNTER — Other Ambulatory Visit (HOSPITAL_COMMUNITY): Payer: Self-pay | Admitting: Diagnostic Radiology

## 2021-06-08 DIAGNOSIS — R921 Mammographic calcification found on diagnostic imaging of breast: Secondary | ICD-10-CM

## 2021-06-08 DIAGNOSIS — N62 Hypertrophy of breast: Secondary | ICD-10-CM | POA: Diagnosis not present

## 2021-06-25 DIAGNOSIS — G4730 Sleep apnea, unspecified: Secondary | ICD-10-CM | POA: Diagnosis not present

## 2021-06-25 DIAGNOSIS — I471 Supraventricular tachycardia: Secondary | ICD-10-CM | POA: Diagnosis not present

## 2021-06-25 DIAGNOSIS — E785 Hyperlipidemia, unspecified: Secondary | ICD-10-CM | POA: Diagnosis not present

## 2021-06-25 DIAGNOSIS — E559 Vitamin D deficiency, unspecified: Secondary | ICD-10-CM | POA: Diagnosis not present

## 2021-06-25 DIAGNOSIS — E890 Postprocedural hypothyroidism: Secondary | ICD-10-CM | POA: Diagnosis not present

## 2021-06-25 DIAGNOSIS — G894 Chronic pain syndrome: Secondary | ICD-10-CM | POA: Diagnosis not present

## 2021-06-25 DIAGNOSIS — D3500 Benign neoplasm of unspecified adrenal gland: Secondary | ICD-10-CM | POA: Diagnosis not present

## 2021-06-25 DIAGNOSIS — N6019 Diffuse cystic mastopathy of unspecified breast: Secondary | ICD-10-CM | POA: Diagnosis not present

## 2021-06-25 DIAGNOSIS — R7301 Impaired fasting glucose: Secondary | ICD-10-CM | POA: Diagnosis not present

## 2021-06-27 ENCOUNTER — Encounter: Payer: Self-pay | Admitting: Family Medicine

## 2021-06-27 ENCOUNTER — Ambulatory Visit: Payer: Medicare Other | Admitting: Family Medicine

## 2021-06-27 VITALS — BP 160/80 | Ht 62.5 in | Wt 218.0 lb

## 2021-06-27 DIAGNOSIS — M7918 Myalgia, other site: Secondary | ICD-10-CM | POA: Diagnosis not present

## 2021-06-27 DIAGNOSIS — M160 Bilateral primary osteoarthritis of hip: Secondary | ICD-10-CM

## 2021-06-27 NOTE — Assessment & Plan Note (Signed)
Has improved significantly since the injection.  No longer having periscapular pain. ?-Counseled on home exercise therapy and supportive care. ?-Could consider physical therapy. ?

## 2021-06-27 NOTE — Progress Notes (Signed)
?  Elizabeth Barron - 76 y.o. female MRN [id number]  Date of birth: April 18, 1945 ? ?SUBJECTIVE:  Including CC & ROS.  ?No chief complaint on file. ? ? ?Elizabeth Barron is a 76 y.o. female that is following up for her back pain.  She has been doing well since the injection.  She also has a history of arthritis of the hips and was told she needed replacement. ? ? ? ?Review of Systems ?See HPI  ? ?HISTORY: Past Medical, Surgical, Social, and Family History Reviewed & Updated per EMR.   ?Pertinent Historical Findings include: ? ?Past Medical History:  ?Diagnosis Date  ? Arthritis   ? knees. back  ? Colon polyps   ? GERD (gastroesophageal reflux disease)   ? Glaucoma   ? Headache   ? Hypercholesteremia   ? Hypothyroidism   ? thyroid removed  ? Insomnia   ? Low back pain   ? Obesity   ? Osteoarthritis   ? r hip, left hip  ? Pre-diabetes   ? Restless leg syndrome   ? Sleep apnea   ? uses cpap  ? ? ?Past Surgical History:  ?Procedure Laterality Date  ? ANTERIOR CRUCIATE LIGAMENT REPAIR    ? BREAST LUMPECTOMY    ? L breast- benign  ? BUNIONECTOMY Left 2012  ? COLONOSCOPY WITH PROPOFOL N/A 10/11/2014  ? Procedure: COLONOSCOPY WITH PROPOFOL;  Surgeon: Garlan Fair, MD;  [facility]: [facility];  Service: Endoscopy;  Laterality: N/A;  ? KNEE SURGERY    ? bilateral scope surgery  ? MAXIMUM ACCESS (MAS)POSTERIOR LUMBAR INTERBODY FUSION (PLIF) 2 LEVEL N/A 12/06/2015  ? Procedure: Lumbar four-five, Lumbar five-Sacral one MAXIMUM ACCESS POSTERIOR LUMBAR INTERBODY FUSION   ;  Surgeon: Eustace Moore, MD;  Location: [place] [facility];  Service: Neurosurgery;  Laterality: N/A;  ? RADIOLOGY WITH ANESTHESIA Left 09/05/2016  ? Procedure: MRI OF LEFT HIP WITHOUT;  Surgeon: Radiologist, Medication, MD;  Location: [place];  Service: Radiology;  Laterality: Left;  ? RADIOLOGY WITH ANESTHESIA N/A 06/05/2021  ? Procedure: MRI WITH CERVICAL SPINE WITHOUT CONTRAST;  Surgeon: Radiologist, Medication, MD;  Location: [place];  Service: Radiology;  Laterality: N/A;  ?  THYROIDECTOMY    ? TONSILLECTOMY  1955  ? child  ? TUBAL LIGATION    ? VAGINAL HYSTERECTOMY  1981  ? ? ? ?PHYSICAL EXAM:  ?VS: BP (!) 160/80 (BP Location: Left Arm, Patient Position: Sitting)   Ht 5' 2.5" (1.588 m)   Wt 218 lb (98.9 kg)   BMI 39.24 kg/m?  ?Physical Exam ?Gen: NAD, alert, cooperative with exam, well-appearing ?MSK:  ?Neurovascularly intact   ? ? ? ? ?ASSESSMENT & PLAN:  ? ?Myofascial pain on left side ?Has improved significantly since the injection.  No longer having periscapular pain. ?-Counseled on home exercise therapy and supportive care. ?-Could consider physical therapy. ? ?Primary osteoarthritis of both hips ?Acute on chronic in nature.  MRI in 2018 is showing moderate bilateral degenerative changes. ?-Counseled on home exercise therapy and supportive care. ?-Referral to orthopedic surgery. ? ? ? ? ?

## 2021-06-27 NOTE — Assessment & Plan Note (Signed)
Acute on chronic in nature.  MRI in 2018 is showing moderate bilateral degenerative changes. ?-Counseled on home exercise therapy and supportive care. ?-Referral to orthopedic surgery. ?

## 2021-07-09 ENCOUNTER — Ambulatory Visit: Payer: Medicare Other | Admitting: Family Medicine

## 2021-07-09 ENCOUNTER — Encounter: Payer: Self-pay | Admitting: Family Medicine

## 2021-07-09 VITALS — BP 137/83 | HR 66 | Ht 62.5 in | Wt 221.0 lb

## 2021-07-09 DIAGNOSIS — Z9989 Dependence on other enabling machines and devices: Secondary | ICD-10-CM

## 2021-07-09 DIAGNOSIS — G4733 Obstructive sleep apnea (adult) (pediatric): Secondary | ICD-10-CM | POA: Diagnosis not present

## 2021-07-09 NOTE — Patient Instructions (Addendum)
Please continue using your CPAP regularly. While your insurance requires that you use CPAP at least 4 hours each night on 70% of the nights, I recommend, that you not skip any nights and use it throughout the night if you can. Getting used to CPAP and staying with the treatment long term does take time and patience and discipline. Untreated obstructive sleep apnea when it is moderate to severe can have an adverse impact on cardiovascular health and raise her risk for heart disease, arrhythmias, hypertension, congestive heart failure, stroke and diabetes. Untreated obstructive sleep apnea causes sleep disruption, nonrestorative sleep, and sleep deprivation. This can have an impact on your day to day functioning and cause daytime sleepiness and impairment of cognitive function, memory loss, mood disturbance, and problems focussing. Using CPAP regularly can improve these symptoms. ? ?I recommend that you try a product containing immediate and extended release melatonin. You can get this over the counter. Consider cognitive behavior therapy for insomnia if needed. I do want you to keep using your CPAP machine to help manage the sleep apnea.  ? ?Follow up in 6 months ? ?

## 2021-07-09 NOTE — Progress Notes (Signed)
CM sent to [facility] for new order ?

## 2021-07-09 NOTE — Progress Notes (Signed)
? ? ?PATIENT: Elizabeth Barron ?DOB: May 11, 1945 ? ?REASON FOR VISIT: follow up ?HISTORY FROM: patient ? ?Chief Complaint  ?Patient presents with  ? Follow-up  ?  Pt alone, rm 16. The cpap doesn't help her sleep. She states her sleeping is still not sufficient. States that she wanders why she wears the machine if not gonna  ?  ? ?HISTORY OF PRESENT ILLNESS: ? ?07/09/21 ALL: ?Elizabeth Barron returns for follow up for OSA on CPAP. She was last seen by me in 11/2019 and reported having difficulty with compliance. Dr Rexene Alberts saw her last 06/2020 and compliance had improved. She was having difficulty initiating and maintaining sleep and advised to start melatonin '3mg'$  daily with titration to '6mg'$  if needed. She reports that it helped her get to sleep but she continued to wake every couple of hours. She has tried multiple rx sleep aids including Ambien and Lunesta without benefit. Sleep aids make her feel "hung over" the next day. She usually goes to bed around 12 midnight. She is usually asleep by 1-1:30. She wakes up a couple hours later and it takes her a little while to get back to sleep. She usually gets out of bed around 5:30-6am. She denies nocturia. She feels that her mind doesn't rest some days. 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 She does chores around the house when she gets home. She may nap for about 10 minutes a few times a week. She lives with her husband.  ? ?She has not been consistent with CPAP usage. She states that she doesn't understand the need if she is not sleeping any better using CPAP. She does not note any significant benefit of using CPAP.  ? ? ? ?07/05/2020 ALL:  ?Elizabeth Barron is a 76 y.o. female here today for follow up for recently diagnosed OSA started on CPAP. Sleep study in 06/2019 showed "moderate to severe obstructive sleep apnea, with a total AHI of 17.1/hour, REM AHI of 37.1/hour, and O2 nadir of 85%". CPAP titration showed adequate management of OSA at set pressure of  10cmH20. She reports that she places CPAP therapy every night. She continues to have a very difficult time getting to and staying asleep. She does feel that there has been some benefit to using CPAP. She feels she may sleep 3-4 hours each night. Sometimes only 2 hours. She does feel that sleep quality has improved since using CPAP. She wakes feeling more refreshed. She has struggled with insomnia for years. She has tried multiple sleep aids that only made her feel sluggish.  ? ?Compliance report dated 11/14/2019 3920 2021 reveals that she used CPAP 29 of the past 30 days for compliance of 97%.  She used CPAP greater than 4 hours 29 of the past 30 days for compliance of 97%.  Average usage was 5 hours and 31 minutes.  Residual AHI was 1.0 on 10 cm of water and an EPR of 3.  There is no significant leak noted. ? ?HISTORY: (copied from Dr Guadelupe Sabin note on 07/05/2019) ? ?Dear Dr. Forde Dandy,  ?  ?I saw your patient, Elizabeth Barron, upon your kind request, in my Neurologic clinic today for initial consultation of her sleep disorder, in particular, concern for underlying obstructive sleep apnea.  The patient is unaccompanied today.  As you know, Elizabeth Barron is a 76 year old right-handed woman with an underlying complex medical history of arthritis, low back pain, hypothyroidism, hyperlipidemia, glaucoma, reflux disease, restless leg syndrome,  and morbid obesity with a BMI of over 40, who reports chronic difficulty with sleep initiation and sleep maintenance for many years.  She had a sleep study many years ago, maybe 10 or 15 years ago which was negative for sleep apnea at the time.  Prior sleep study results are not available for my review today.  I reviewed your office note from 06/16/2019.  Her Epworth sleepiness score is 16 out of 24.  She is sleepy during the day and sleeps during the day often.  She has a bedtime typically after midnight and sometimes after 1 AM, sometimes she sleeps better in the recliner because of less leg  pain.  She is followed by pain management at [facility].  Her primary care physician has taken her off of her gabapentin recently in the past 2 weeks or so because of nausea.  She takes Aleve as needed.  She has tried over-the-counter sleep aids including p.m. type medication and melatonin some years ago.  She has tried Ambien which made her groggy or drowsy during the day, she may have tried [place], she is not sure if she tried Belsomra or trazodone.  She typically gets out of bed around 6 AM.  She goes to the bathroom once in the early morning hours.  She has had occasional morning headaches and has symptoms of restless legs but often she has leg pain which comes from the back.  Her sister has a diagnosis of sleep apnea but is not on a CPAP machine.  The patient is a non-smoker and does not drink alcohol and drinks no caffeine on a daily basis.  She is retired, she lives with her husband.  They have no pets at the house.  She has a TV on at night in her bedroom but typically turns it off when she is ready to fall asleep. ? ? ?REVIEW OF SYSTEMS: Out of a complete 14 system review of symptoms, the patient complains only of the following symptoms, insomnia and all other reviewed systems are negative. ? ?ALLERGIES: ?No Known Allergies ? ? ?HOME MEDICATIONS: ?Outpatient Medications Prior to Visit  ?Medication Sig Dispense Refill  ? acetaminophen (TYLENOL) 650 MG CR tablet Take 1,300 mg by mouth daily as needed (Arthritis).    ? aspirin EC 81 MG tablet Take 81 mg by mouth daily.    ? celecoxib (CELEBREX) 200 MG capsule One to 2 tablets by mouth daily as needed for pain. 60 capsule 2  ? Cyanocobalamin (VITAMIN B 12) 500 MCG TABS Take 1,000 mcg by mouth daily.    ? dorzolamide (TRUSOPT) 2 % ophthalmic solution Place 1 drop into both eyes 2 (two) times daily.    ? esomeprazole (NEXIUM) 40 MG capsule Take 40 mg by mouth every other day.    ? FAMOTIDINE PO Take by mouth every other day. Alternate with omeprazole    ?  levothyroxine (SYNTHROID, LEVOTHROID) 175 MCG tablet Take 175 mcg by mouth See admin instructions. Take 175 mg Monday through Friday,  ?Take 37.5 mg on Saturday,  None on Sunday    ? Probiotic Product (ALIGN PO) Take 1 tablet by mouth 2 (two) times a week.    ? rosuvastatin (CRESTOR) 5 MG tablet Take 5 mg by mouth 2 (two) times a week. On Sunday and Wednesday    ? Vitamin D, Ergocalciferol, (DRISDOL) 50000 units CAPS capsule Take 50,000 Units by mouth every Friday.    ? Wheat Dextrin (BENEFIBER) POWD Take 5 mLs by mouth daily.  Over meal    ? omeprazole (PRILOSEC) 40 MG capsule Take 40 mg by mouth every other day. Alternate with famodine    ? baclofen (LIORESAL) 10 MG tablet Take 0.5 tablets (5 mg total) by mouth 2 (two) times daily as needed for muscle spasms. (Patient not taking: Reported on 05/30/2021) 30 each 1  ? oxyCODONE-acetaminophen (PERCOCET) 7.5-325 MG tablet Take 1 tablet by mouth every 8 (eight) hours as needed. 15 tablet 0  ? ?No facility-administered medications prior to visit.  ? ? ?PAST MEDICAL HISTORY: ?Past Medical History:  ?Diagnosis Date  ? Arthritis   ? knees. back  ? Colon polyps   ? GERD (gastroesophageal reflux disease)   ? Glaucoma   ? Headache   ? Hypercholesteremia   ? Hypothyroidism   ? thyroid removed  ? Insomnia   ? Low back pain   ? Obesity   ? Osteoarthritis   ? r hip, left hip  ? Pre-diabetes   ? Restless leg syndrome   ? Sleep apnea   ? uses cpap  ? ? ?PAST SURGICAL HISTORY: ?Past Surgical History:  ?Procedure Laterality Date  ? ANTERIOR CRUCIATE LIGAMENT REPAIR    ? BREAST LUMPECTOMY    ? L breast- benign  ? BUNIONECTOMY Left 2012  ? COLONOSCOPY WITH PROPOFOL N/A 10/11/2014  ? Procedure: COLONOSCOPY WITH PROPOFOL;  Surgeon: Garlan Fair, MD;  Location: [facility];  Service: Endoscopy;  Laterality: N/A;  ? KNEE SURGERY    ? bilateral scope surgery  ? MAXIMUM ACCESS (MAS)POSTERIOR LUMBAR INTERBODY FUSION (PLIF) 2 LEVEL N/A 12/06/2015  ? Procedure: Lumbar four-five, Lumbar  five-Sacral one MAXIMUM ACCESS POSTERIOR LUMBAR INTERBODY FUSION   ;  Surgeon: Eustace Moore, MD;  Location: [place] [facility];  Service: Neurosurgery;  Laterality: N/A;  ? RADIOLOGY WITH ANESTHESIA Left 09/05/2016  ? Procedu

## 2021-07-10 DIAGNOSIS — M25552 Pain in left hip: Secondary | ICD-10-CM | POA: Diagnosis not present

## 2021-07-10 DIAGNOSIS — M25551 Pain in right hip: Secondary | ICD-10-CM | POA: Diagnosis not present

## 2021-07-30 DIAGNOSIS — G8929 Other chronic pain: Secondary | ICD-10-CM | POA: Diagnosis not present

## 2021-07-30 DIAGNOSIS — M1990 Unspecified osteoarthritis, unspecified site: Secondary | ICD-10-CM | POA: Diagnosis not present

## 2021-07-30 DIAGNOSIS — Z79899 Other long term (current) drug therapy: Secondary | ICD-10-CM | POA: Diagnosis not present

## 2021-07-30 DIAGNOSIS — Z5181 Encounter for therapeutic drug level monitoring: Secondary | ICD-10-CM | POA: Diagnosis not present

## 2021-07-30 DIAGNOSIS — I700 Atherosclerosis of aorta: Secondary | ICD-10-CM | POA: Diagnosis not present

## 2021-07-30 DIAGNOSIS — E7800 Pure hypercholesterolemia, unspecified: Secondary | ICD-10-CM | POA: Diagnosis not present

## 2021-08-06 DIAGNOSIS — M1612 Unilateral primary osteoarthritis, left hip: Secondary | ICD-10-CM | POA: Diagnosis not present

## 2021-08-08 DIAGNOSIS — M1612 Unilateral primary osteoarthritis, left hip: Secondary | ICD-10-CM | POA: Diagnosis not present

## 2021-08-15 ENCOUNTER — Ambulatory Visit
Admission: RE | Admit: 2021-08-15 | Discharge: 2021-08-15 | Disposition: A | Discharge status: Home / Self Care | Payer: Medicare Other | Source: Ambulatory Visit | Attending: Internal Medicine | Admitting: Internal Medicine

## 2021-08-15 DIAGNOSIS — Z780 Asymptomatic menopausal state: Secondary | ICD-10-CM | POA: Diagnosis not present

## 2021-08-15 DIAGNOSIS — M85831 Other specified disorders of bone density and structure, right forearm: Secondary | ICD-10-CM | POA: Diagnosis not present

## 2021-08-15 DIAGNOSIS — M8580 Other specified disorders of bone density and structure, unspecified site: Secondary | ICD-10-CM

## 2021-08-17 DIAGNOSIS — M25752 Osteophyte, left hip: Secondary | ICD-10-CM | POA: Diagnosis not present

## 2021-08-17 DIAGNOSIS — Z96642 Presence of left artificial hip joint: Secondary | ICD-10-CM | POA: Diagnosis not present

## 2021-08-17 DIAGNOSIS — M1612 Unilateral primary osteoarthritis, left hip: Secondary | ICD-10-CM | POA: Diagnosis not present

## 2021-08-21 DIAGNOSIS — M25652 Stiffness of left hip, not elsewhere classified: Secondary | ICD-10-CM | POA: Diagnosis not present

## 2021-08-21 DIAGNOSIS — Z96642 Presence of left artificial hip joint: Secondary | ICD-10-CM | POA: Diagnosis not present

## 2021-08-23 DIAGNOSIS — Z96642 Presence of left artificial hip joint: Secondary | ICD-10-CM | POA: Diagnosis not present

## 2021-08-23 DIAGNOSIS — M25652 Stiffness of left hip, not elsewhere classified: Secondary | ICD-10-CM | POA: Diagnosis not present

## 2021-08-28 DIAGNOSIS — Z96642 Presence of left artificial hip joint: Secondary | ICD-10-CM | POA: Diagnosis not present

## 2021-08-29 DIAGNOSIS — Z96642 Presence of left artificial hip joint: Secondary | ICD-10-CM | POA: Diagnosis not present

## 2021-08-29 DIAGNOSIS — M25652 Stiffness of left hip, not elsewhere classified: Secondary | ICD-10-CM | POA: Diagnosis not present

## 2021-08-31 DIAGNOSIS — M25652 Stiffness of left hip, not elsewhere classified: Secondary | ICD-10-CM | POA: Diagnosis not present

## 2021-08-31 DIAGNOSIS — Z96642 Presence of left artificial hip joint: Secondary | ICD-10-CM | POA: Diagnosis not present

## 2021-09-04 DIAGNOSIS — Z96642 Presence of left artificial hip joint: Secondary | ICD-10-CM | POA: Diagnosis not present

## 2021-09-04 DIAGNOSIS — M25652 Stiffness of left hip, not elsewhere classified: Secondary | ICD-10-CM | POA: Diagnosis not present

## 2021-09-05 DIAGNOSIS — Z96642 Presence of left artificial hip joint: Secondary | ICD-10-CM | POA: Diagnosis not present

## 2021-09-05 DIAGNOSIS — M25651 Stiffness of right hip, not elsewhere classified: Secondary | ICD-10-CM | POA: Diagnosis not present

## 2021-09-05 DIAGNOSIS — G4733 Obstructive sleep apnea (adult) (pediatric): Secondary | ICD-10-CM | POA: Diagnosis not present

## 2021-09-06 DIAGNOSIS — Z96642 Presence of left artificial hip joint: Secondary | ICD-10-CM | POA: Diagnosis not present

## 2021-09-06 DIAGNOSIS — M25652 Stiffness of left hip, not elsewhere classified: Secondary | ICD-10-CM | POA: Diagnosis not present

## 2021-09-11 DIAGNOSIS — Z96642 Presence of left artificial hip joint: Secondary | ICD-10-CM | POA: Diagnosis not present

## 2021-09-11 DIAGNOSIS — M25652 Stiffness of left hip, not elsewhere classified: Secondary | ICD-10-CM | POA: Diagnosis not present

## 2021-09-13 DIAGNOSIS — Z96642 Presence of left artificial hip joint: Secondary | ICD-10-CM | POA: Diagnosis not present

## 2021-09-13 DIAGNOSIS — M25652 Stiffness of left hip, not elsewhere classified: Secondary | ICD-10-CM | POA: Diagnosis not present

## 2021-09-18 DIAGNOSIS — M25652 Stiffness of left hip, not elsewhere classified: Secondary | ICD-10-CM | POA: Diagnosis not present

## 2021-09-18 DIAGNOSIS — Z96642 Presence of left artificial hip joint: Secondary | ICD-10-CM | POA: Diagnosis not present

## 2021-09-20 DIAGNOSIS — M25652 Stiffness of left hip, not elsewhere classified: Secondary | ICD-10-CM | POA: Diagnosis not present

## 2021-09-20 DIAGNOSIS — Z96642 Presence of left artificial hip joint: Secondary | ICD-10-CM | POA: Diagnosis not present

## 2021-09-27 DIAGNOSIS — M25652 Stiffness of left hip, not elsewhere classified: Secondary | ICD-10-CM | POA: Diagnosis not present

## 2021-09-27 DIAGNOSIS — Z96642 Presence of left artificial hip joint: Secondary | ICD-10-CM | POA: Diagnosis not present

## 2021-10-16 DIAGNOSIS — M1990 Unspecified osteoarthritis, unspecified site: Secondary | ICD-10-CM | POA: Diagnosis not present

## 2021-10-16 DIAGNOSIS — G8929 Other chronic pain: Secondary | ICD-10-CM | POA: Diagnosis not present

## 2021-10-16 DIAGNOSIS — E7800 Pure hypercholesterolemia, unspecified: Secondary | ICD-10-CM | POA: Diagnosis not present

## 2021-10-17 DIAGNOSIS — M1611 Unilateral primary osteoarthritis, right hip: Secondary | ICD-10-CM | POA: Diagnosis not present

## 2021-10-17 DIAGNOSIS — M25551 Pain in right hip: Secondary | ICD-10-CM | POA: Diagnosis not present

## 2021-10-26 DIAGNOSIS — M1611 Unilateral primary osteoarthritis, right hip: Secondary | ICD-10-CM | POA: Diagnosis not present

## 2021-10-26 DIAGNOSIS — Z96641 Presence of right artificial hip joint: Secondary | ICD-10-CM | POA: Diagnosis not present

## 2021-10-29 DIAGNOSIS — Z96641 Presence of right artificial hip joint: Secondary | ICD-10-CM | POA: Diagnosis not present

## 2021-10-29 DIAGNOSIS — M25651 Stiffness of right hip, not elsewhere classified: Secondary | ICD-10-CM | POA: Diagnosis not present

## 2021-10-30 DIAGNOSIS — Z96641 Presence of right artificial hip joint: Secondary | ICD-10-CM | POA: Diagnosis not present

## 2021-10-30 DIAGNOSIS — M25651 Stiffness of right hip, not elsewhere classified: Secondary | ICD-10-CM | POA: Diagnosis not present

## 2021-11-01 DIAGNOSIS — Z96641 Presence of right artificial hip joint: Secondary | ICD-10-CM | POA: Diagnosis not present

## 2021-11-01 DIAGNOSIS — M25651 Stiffness of right hip, not elsewhere classified: Secondary | ICD-10-CM | POA: Diagnosis not present

## 2021-11-05 DIAGNOSIS — M25651 Stiffness of right hip, not elsewhere classified: Secondary | ICD-10-CM | POA: Diagnosis not present

## 2021-11-05 DIAGNOSIS — Z96641 Presence of right artificial hip joint: Secondary | ICD-10-CM | POA: Diagnosis not present

## 2021-11-06 DIAGNOSIS — Z471 Aftercare following joint replacement surgery: Secondary | ICD-10-CM | POA: Diagnosis not present

## 2021-11-06 DIAGNOSIS — Z96641 Presence of right artificial hip joint: Secondary | ICD-10-CM | POA: Diagnosis not present

## 2021-11-09 DIAGNOSIS — M25651 Stiffness of right hip, not elsewhere classified: Secondary | ICD-10-CM | POA: Diagnosis not present

## 2021-11-09 DIAGNOSIS — Z96641 Presence of right artificial hip joint: Secondary | ICD-10-CM | POA: Diagnosis not present

## 2021-11-12 DIAGNOSIS — Z96641 Presence of right artificial hip joint: Secondary | ICD-10-CM | POA: Diagnosis not present

## 2021-11-12 DIAGNOSIS — M25651 Stiffness of right hip, not elsewhere classified: Secondary | ICD-10-CM | POA: Diagnosis not present

## 2021-11-14 DIAGNOSIS — Z96641 Presence of right artificial hip joint: Secondary | ICD-10-CM | POA: Diagnosis not present

## 2021-11-14 DIAGNOSIS — M25651 Stiffness of right hip, not elsewhere classified: Secondary | ICD-10-CM | POA: Diagnosis not present

## 2021-11-19 DIAGNOSIS — M25651 Stiffness of right hip, not elsewhere classified: Secondary | ICD-10-CM | POA: Diagnosis not present

## 2021-11-19 DIAGNOSIS — Z96641 Presence of right artificial hip joint: Secondary | ICD-10-CM | POA: Diagnosis not present

## 2021-11-23 DIAGNOSIS — Z96641 Presence of right artificial hip joint: Secondary | ICD-10-CM | POA: Diagnosis not present

## 2021-11-23 DIAGNOSIS — M25651 Stiffness of right hip, not elsewhere classified: Secondary | ICD-10-CM | POA: Diagnosis not present

## 2021-11-27 DIAGNOSIS — M25651 Stiffness of right hip, not elsewhere classified: Secondary | ICD-10-CM | POA: Diagnosis not present

## 2021-11-27 DIAGNOSIS — Z96641 Presence of right artificial hip joint: Secondary | ICD-10-CM | POA: Diagnosis not present

## 2021-12-03 DIAGNOSIS — H40153 Residual stage of open-angle glaucoma, bilateral: Secondary | ICD-10-CM | POA: Diagnosis not present

## 2021-12-04 DIAGNOSIS — Z96641 Presence of right artificial hip joint: Secondary | ICD-10-CM | POA: Diagnosis not present

## 2021-12-04 DIAGNOSIS — M25651 Stiffness of right hip, not elsewhere classified: Secondary | ICD-10-CM | POA: Diagnosis not present

## 2021-12-05 DIAGNOSIS — G4733 Obstructive sleep apnea (adult) (pediatric): Secondary | ICD-10-CM | POA: Diagnosis not present

## 2021-12-10 DIAGNOSIS — M25651 Stiffness of right hip, not elsewhere classified: Secondary | ICD-10-CM | POA: Diagnosis not present

## 2021-12-10 DIAGNOSIS — Z96641 Presence of right artificial hip joint: Secondary | ICD-10-CM | POA: Diagnosis not present

## 2021-12-11 DIAGNOSIS — M25651 Stiffness of right hip, not elsewhere classified: Secondary | ICD-10-CM | POA: Diagnosis not present

## 2021-12-11 DIAGNOSIS — Z96641 Presence of right artificial hip joint: Secondary | ICD-10-CM | POA: Diagnosis not present

## 2021-12-24 DIAGNOSIS — E559 Vitamin D deficiency, unspecified: Secondary | ICD-10-CM | POA: Diagnosis not present

## 2021-12-24 DIAGNOSIS — D649 Anemia, unspecified: Secondary | ICD-10-CM | POA: Diagnosis not present

## 2021-12-24 DIAGNOSIS — E785 Hyperlipidemia, unspecified: Secondary | ICD-10-CM | POA: Diagnosis not present

## 2021-12-24 DIAGNOSIS — Z13828 Encounter for screening for other musculoskeletal disorder: Secondary | ICD-10-CM | POA: Diagnosis not present

## 2021-12-24 DIAGNOSIS — G894 Chronic pain syndrome: Secondary | ICD-10-CM | POA: Diagnosis not present

## 2021-12-24 DIAGNOSIS — N6019 Diffuse cystic mastopathy of unspecified breast: Secondary | ICD-10-CM | POA: Diagnosis not present

## 2021-12-24 DIAGNOSIS — D3500 Benign neoplasm of unspecified adrenal gland: Secondary | ICD-10-CM | POA: Diagnosis not present

## 2021-12-24 DIAGNOSIS — I471 Supraventricular tachycardia, unspecified: Secondary | ICD-10-CM | POA: Diagnosis not present

## 2021-12-24 DIAGNOSIS — R7301 Impaired fasting glucose: Secondary | ICD-10-CM | POA: Diagnosis not present

## 2021-12-24 DIAGNOSIS — Z23 Encounter for immunization: Secondary | ICD-10-CM | POA: Diagnosis not present

## 2021-12-24 DIAGNOSIS — I700 Atherosclerosis of aorta: Secondary | ICD-10-CM | POA: Diagnosis not present

## 2021-12-24 DIAGNOSIS — E890 Postprocedural hypothyroidism: Secondary | ICD-10-CM | POA: Diagnosis not present

## 2022-01-07 NOTE — Progress Notes (Unsigned)
PATIENT: Elizabeth Barron DOB: Jul 28, 1945  REASON FOR VISIT: follow up HISTORY FROM: patient  No chief complaint on file.    HISTORY OF PRESENT ILLNESS:  01/07/22 ALL: Elizabeth Barron returns for follow up for OSA on CPAP. She was last seen 06/2021 and had not noted any improvement in sleep quality. She continued to have difficulty initiating and maintaining sleep. We discussed sleep apnea versus insomnia. She was encouraged to continue using CPAP and advised to try melatonin extended release. Since,   07/09/2021 ALL: Elizabeth Barron returns for follow up for OSA on CPAP. She was last seen by me in 11/2019 and reported having difficulty with compliance. Dr Rexene Alberts saw her last 06/2020 and compliance had improved. She was having difficulty initiating and maintaining sleep and advised to start melatonin '3mg'$  daily with titration to '6mg'$  if needed. She reports that it helped her get to sleep but she continued to wake every couple of hours. She has tried multiple rx sleep aids including Ambien and Lunesta without benefit. Sleep aids make her feel "hung over" the next day. She usually goes to bed around 12 midnight. She is usually asleep by 1-1:30. She wakes up a couple hours later and it takes her a little while to get back to sleep. She usually gets out of bed around 5:30-6am. She denies nocturia. She feels that her mind doesn't rest some days. 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 She does chores around the house when she gets home. She may nap for about 10 minutes a few times a week. She lives with her husband.   She has not been consistent with CPAP usage. She states that she doesn't understand the need if she is not sleeping any better using CPAP. She does not note any significant benefit of using CPAP.     07/05/2020 ALL:  Elizabeth Barron is a 76 y.o. female here today for follow up for recently diagnosed OSA started on CPAP. Sleep study in 06/2019 showed "moderate to severe obstructive  sleep apnea, with a total AHI of 17.1/hour, REM AHI of 37.1/hour, and O2 nadir of 85%". CPAP titration showed adequate management of OSA at set pressure of 10cmH20. She reports that she places CPAP therapy every night. She continues to have a very difficult time getting to and staying asleep. She does feel that there has been some benefit to using CPAP. She feels she may sleep 3-4 hours each night. Sometimes only 2 hours. She does feel that sleep quality has improved since using CPAP. She wakes feeling more refreshed. She has struggled with insomnia for years. She has tried multiple sleep aids that only made her feel sluggish.   Compliance report dated 11/14/2019 3920 2021 reveals that she used CPAP 29 of the past 30 days for compliance of 97%.  She used CPAP greater than 4 hours 29 of the past 30 days for compliance of 97%.  Average usage was 5 hours and 31 minutes.  Residual AHI was 1.0 on 10 cm of water and an EPR of 3.  There is no significant leak noted.  HISTORY: (copied from Dr Guadelupe Sabin note on 07/05/2019)  Dear Dr. Forde Dandy,    I saw your patient, Elizabeth Barron, upon your kind request, in my Neurologic clinic today for initial consultation of her sleep disorder, in particular, concern for underlying obstructive sleep apnea.  The patient is unaccompanied today.  As you know, Ms. Benbrook is a 76 year old right-handed woman  with an underlying complex medical history of arthritis, low back pain, hypothyroidism, hyperlipidemia, glaucoma, reflux disease, restless leg syndrome, and morbid obesity with a BMI of over 40, who reports chronic difficulty with sleep initiation and sleep maintenance for many years.  She had a sleep study many years ago, maybe 10 or 15 years ago which was negative for sleep apnea at the time.  Prior sleep study results are not available for my review today.  I reviewed your office note from 06/16/2019.  Her Epworth sleepiness score is 16 out of 24.  She is sleepy during the day and sleeps  during the day often.  She has a bedtime typically after midnight and sometimes after 1 AM, sometimes she sleeps better in the recliner because of less leg pain.  She is followed by pain management at [facility].  Her primary care physician has taken her off of her gabapentin recently in the past 2 weeks or so because of nausea.  She takes Aleve as needed.  She has tried over-the-counter sleep aids including p.m. type medication and melatonin some years ago.  She has tried Ambien which made her groggy or drowsy during the day, she may have tried [place], she is not sure if she tried Belsomra or trazodone.  She typically gets out of bed around 6 AM.  She goes to the bathroom once in the early morning hours.  She has had occasional morning headaches and has symptoms of restless legs but often she has leg pain which comes from the back.  Her sister has a diagnosis of sleep apnea but is not on a CPAP machine.  The patient is a non-smoker and does not drink alcohol and drinks no caffeine on a daily basis.  She is retired, she lives with her husband.  They have no pets at the house.  She has a TV on at night in her bedroom but typically turns it off when she is ready to fall asleep.   REVIEW OF SYSTEMS: Out of a complete 14 system review of symptoms, the patient complains only of the following symptoms, insomnia and all other reviewed systems are negative.  ALLERGIES: No Known Allergies   HOME MEDICATIONS: Outpatient Medications Prior to Visit  Medication Sig Dispense Refill   acetaminophen (TYLENOL) 650 MG CR tablet Take 1,300 mg by mouth daily as needed (Arthritis).     aspirin EC 81 MG tablet Take 81 mg by mouth daily.     celecoxib (CELEBREX) 200 MG capsule One to 2 tablets by mouth daily as needed for pain. 60 capsule 2   Cyanocobalamin (VITAMIN B 12) 500 MCG TABS Take 1,000 mcg by mouth daily.     dorzolamide (TRUSOPT) 2 % ophthalmic solution Place 1 drop into both eyes 2 (two) times daily.      esomeprazole (NEXIUM) 40 MG capsule Take 40 mg by mouth every other day.     FAMOTIDINE PO Take by mouth every other day. Alternate with omeprazole     levothyroxine (SYNTHROID, LEVOTHROID) 175 MCG tablet Take 175 mcg by mouth See admin instructions. Take 175 mg Monday through Friday,  Take 37.5 mg on Saturday,  None on Sunday     Probiotic Product (ALIGN PO) Take 1 tablet by mouth 2 (two) times a week.     rosuvastatin (CRESTOR) 5 MG tablet Take 5 mg by mouth 2 (two) times a week. On Sunday and Wednesday     Vitamin D, Ergocalciferol, (DRISDOL) 50000 units CAPS capsule Take 50,000  Units by mouth every Friday.     Wheat Dextrin (BENEFIBER) POWD Take 5 mLs by mouth daily. Over meal     No facility-administered medications prior to visit.    PAST MEDICAL HISTORY: Past Medical History:  Diagnosis Date   Arthritis    knees. back   Colon polyps    GERD (gastroesophageal reflux disease)    Glaucoma    Headache    Hypercholesteremia    Hypothyroidism    thyroid removed   Insomnia    Low back pain    Obesity    Osteoarthritis    r hip, left hip   Pre-diabetes    Restless leg syndrome    Sleep apnea    uses cpap    PAST SURGICAL HISTORY: Past Surgical History:  Procedure Laterality Date   ANTERIOR CRUCIATE LIGAMENT REPAIR     BREAST LUMPECTOMY     L breast- benign   BUNIONECTOMY Left 2012   COLONOSCOPY WITH PROPOFOL N/A 10/11/2014   Procedure: COLONOSCOPY WITH PROPOFOL;  Surgeon: Garlan Fair, MD;  Location: [facility];  Service: Endoscopy;  Laterality: N/A;   KNEE SURGERY     bilateral scope surgery   MAXIMUM ACCESS (MAS)POSTERIOR LUMBAR INTERBODY FUSION (PLIF) 2 LEVEL N/A 12/06/2015   Procedure: Lumbar four-five, Lumbar five-Sacral one MAXIMUM ACCESS POSTERIOR LUMBAR INTERBODY FUSION   ;  Surgeon: Eustace Moore, MD;  Location: [place] [facility];  Service: Neurosurgery;  Laterality: N/A;   RADIOLOGY WITH ANESTHESIA Left 09/05/2016   Procedure: MRI OF LEFT HIP WITHOUT;  Surgeon:  Radiologist, Medication, MD;  Location: [place];  Service: Radiology;  Laterality: Left;   RADIOLOGY WITH ANESTHESIA N/A 06/05/2021   Procedure: MRI WITH CERVICAL SPINE WITHOUT CONTRAST;  Surgeon: Radiologist, Medication, MD;  Location: [place];  Service: Radiology;  Laterality: N/A;   THYROIDECTOMY     TONSILLECTOMY  1955   child   TUBAL LIGATION     VAGINAL HYSTERECTOMY  1981    FAMILY HISTORY: Family History  Problem Relation Age of Onset   Cancer Mother        pancreatic   Diabetes Mother    Hypertension Mother    Cancer Father        esophageal   Diabetes Father    Hypertension Father    Diabetes Sister    Other Sister        dialysis    SOCIAL HISTORY: Social History   Socioeconomic History   Marital status: Married    Spouse name: Not on file   Number of children: 1   Years of education: Not on file   Highest education level: High school graduate  Occupational History    Employer: RETIRED  Tobacco Use   Smoking status: Never   Smokeless tobacco: Never  Vaping Use   Vaping Use: Never used  Substance and Sexual Activity   Alcohol use: Yes    Comment: OCCASIONAL WINE   Drug use: No   Sexual activity: Not on file  Other Topics Concern   Not on file  Social History Narrative   Lives with souse   caffeine none   Social Determinants of Health   Financial Resource Strain: Not on file  Food Insecurity: Not on file  Transportation Needs: Not on file  Physical Activity: Not on file  Stress: Not on file  Social Connections: Not on file  Intimate Partner Violence: Not on file    PHYSICAL EXAM  There were no vitals filed for this  visit.   There is no height or weight on file to calculate BMI.  Generalized: Well developed, in no acute distress  Cardiology: normal rate and rhythm, no murmur noted Respiratory: clear to auscultation bilaterally  Neurological examination  Mentation: Alert oriented to time, place, history taking. Follows all commands speech  and language fluent Cranial nerve II-XII: Pupils were equal round reactive to light. Extraocular movements were full, visual field were full  Motor: The motor testing reveals 5 over 5 strength of all 4 extremities. Good symmetric motor tone is noted throughout.  Gait and station: Gait is stable with Rolator     DIAGNOSTIC DATA (LABS, IMAGING, TESTING) - I reviewed patient records, labs, notes, testing and imaging myself where available.      No data to display           Lab Results  Component Value Date   WBC 7.4 03/20/2021   HGB 14.9 03/20/2021   HCT 47.6 (H) 03/20/2021   MCV 87.3 03/20/2021   PLT 233 03/20/2021      Component Value Date/Time   NA 141 06/05/2021 0719   K 3.4 (L) 06/05/2021 0719   CL 108 06/05/2021 0719   CO2 25 06/05/2021 0719   GLUCOSE 94 06/05/2021 0719   BUN 16 06/05/2021 0719   CREATININE 0.71 06/05/2021 0719   CALCIUM 9.6 06/05/2021 0719   GFRNONAA >60 06/05/2021 0719   GFRAA >60 06/22/2019 1033   No results found for: "CHOL", "HDL", "LDLCALC", "LDLDIRECT", "TRIG", "CHOLHDL" No results found for: "HGBA1C" No results found for: "VITAMINB12" No results found for: "TSH"     ASSESSMENT AND PLAN 76 y.o. year old female  has a past medical history of Arthritis, Colon polyps, GERD (gastroesophageal reflux disease), Glaucoma, Headache, Hypercholesteremia, Hypothyroidism, Insomnia, Low back pain, Obesity, Osteoarthritis, Pre-diabetes, Restless leg syndrome, and Sleep apnea. here with   No diagnosis found.    Skarleth reports that she has not used CPAP consistently as she has not noted any benefit in using therapy. She continues to have difficulty getting to sleep and staying asleep. We have discussed two separate diagnoses of sleep apnea and insomnia. I have explained the importance of using CPAP consistently to manage sleep apnea. She was encouraged to use CPAP nightly for at least 4 hours, or at least anytime she is asleep. She does report melatonin  seemed to help her get to sleep a little faster but she continued to wake up a few hours later. I have shown her several products online with both immediate and extended release melatonin. I have advised she try this medication. Sleep hygiene recommended. I will update DME orders. She will continue to follow-up with primary care.  Healthy lifestyle habits encouraged.  She will follow-up with [place] in 6 months, sooner if needed.  She expresses a desire to continue seeing Dr Rexene Alberts at some interval. I have advised that, routinely, she will follow up with me but we can always ask Dr Rexene Alberts to check in as needed. She verbalizes understanding and agreement with this plan.   No orders of the defined types were placed in this encounter.    No orders of the defined types were placed in this encounter.      Debbora Presto, FNP-C 01/07/2022, 8:35 AM [facility] [address] [place], [place] [address] [phone]

## 2022-01-07 NOTE — Patient Instructions (Incomplete)
Please continue using your CPAP regularly. While your insurance requires that you use CPAP at least 4 hours each night on 70% of the nights, I recommend, that you not skip any nights and use it throughout the night if you can. Getting used to CPAP and staying with the treatment long term does take time and patience and discipline. Untreated obstructive sleep apnea when it is moderate to severe can have an adverse impact on cardiovascular health and raise her risk for heart disease, arrhythmias, hypertension, congestive heart failure, stroke and diabetes. Untreated obstructive sleep apnea causes sleep disruption, nonrestorative sleep, and sleep deprivation. This can have an impact on your day to day functioning and cause daytime sleepiness and impairment of cognitive function, memory loss, mood disturbance, and problems focussing. Using CPAP regularly can improve these symptoms.   Continue working on meeting compliance. I am glad that you are feeling better. You can continue Tylenol PM but the benadryl in this med could cause restless legs. Try taking 12.5mg  of Benadryl with melatonin 10mg  at bedtime.   Follow up in 6 months

## 2022-01-08 ENCOUNTER — Ambulatory Visit: Payer: Medicare Other | Admitting: Family Medicine

## 2022-01-08 ENCOUNTER — Encounter: Payer: Self-pay | Admitting: Family Medicine

## 2022-01-08 VITALS — BP 142/94 | HR 71 | Ht 61.0 in | Wt 214.0 lb

## 2022-01-08 DIAGNOSIS — G4700 Insomnia, unspecified: Secondary | ICD-10-CM | POA: Diagnosis not present

## 2022-01-08 DIAGNOSIS — G4733 Obstructive sleep apnea (adult) (pediatric): Secondary | ICD-10-CM

## 2022-01-21 DIAGNOSIS — M5416 Radiculopathy, lumbar region: Secondary | ICD-10-CM | POA: Diagnosis not present

## 2022-01-28 ENCOUNTER — Other Ambulatory Visit: Payer: Self-pay | Admitting: Physical Medicine and Rehabilitation

## 2022-01-28 DIAGNOSIS — M5416 Radiculopathy, lumbar region: Secondary | ICD-10-CM

## 2022-02-01 ENCOUNTER — Ambulatory Visit
Admission: RE | Admit: 2022-02-01 | Discharge: 2022-02-01 | Disposition: A | Discharge status: Home / Self Care | Payer: Medicare Other | Source: Ambulatory Visit | Attending: Physical Medicine and Rehabilitation | Admitting: Physical Medicine and Rehabilitation

## 2022-02-01 DIAGNOSIS — Z981 Arthrodesis status: Secondary | ICD-10-CM | POA: Diagnosis not present

## 2022-02-01 DIAGNOSIS — M5126 Other intervertebral disc displacement, lumbar region: Secondary | ICD-10-CM | POA: Diagnosis not present

## 2022-02-01 DIAGNOSIS — M5416 Radiculopathy, lumbar region: Secondary | ICD-10-CM

## 2022-02-01 DIAGNOSIS — M4316 Spondylolisthesis, lumbar region: Secondary | ICD-10-CM | POA: Diagnosis not present

## 2022-02-01 MED ORDER — IOPAMIDOL (ISOVUE-M 200) INJECTION 41%
20.0000 mL | Freq: Once | INTRAMUSCULAR | Status: AC
  Administered 2022-02-01: 20 mL via INTRATHECAL

## 2022-02-01 MED ORDER — ONDANSETRON HCL 4 MG/2ML IJ SOLN: 4.0000 mg | Freq: Once | INTRAMUSCULAR | Status: DC | PRN

## 2022-02-01 MED ORDER — MEPERIDINE HCL 50 MG/ML IJ SOLN: 50.0000 mg | Freq: Once | INTRAMUSCULAR | Status: DC | PRN

## 2022-02-01 MED ORDER — DIAZEPAM 5 MG PO TABS
5.0000 mg | ORAL_TABLET | Freq: Once | ORAL | Status: AC
  Administered 2022-02-01: 5 mg via ORAL

## 2022-02-01 NOTE — Discharge Instructions (Signed)

## 2022-02-05 DIAGNOSIS — M5416 Radiculopathy, lumbar region: Secondary | ICD-10-CM | POA: Diagnosis not present

## 2022-02-06 ENCOUNTER — Ambulatory Visit: Payer: Medicare Other | Admitting: Cardiology

## 2022-02-22 ENCOUNTER — Encounter: Payer: Self-pay | Admitting: Cardiology

## 2022-02-22 ENCOUNTER — Ambulatory Visit: Payer: Medicare Other | Admitting: Cardiology

## 2022-02-22 VITALS — BP 148/80 | HR 74 | Resp 16 | Ht 61.0 in | Wt 214.2 lb

## 2022-02-22 DIAGNOSIS — E7800 Pure hypercholesterolemia, unspecified: Secondary | ICD-10-CM

## 2022-02-22 DIAGNOSIS — I700 Atherosclerosis of aorta: Secondary | ICD-10-CM | POA: Diagnosis not present

## 2022-02-22 DIAGNOSIS — I452 Bifascicular block: Secondary | ICD-10-CM

## 2022-02-22 DIAGNOSIS — I471 Supraventricular tachycardia, unspecified: Secondary | ICD-10-CM

## 2022-02-22 NOTE — Progress Notes (Signed)
Date:  02/22/2022   ID:  Elizabeth Barron, DOB 06-19-1945, MRN [id number]  PCP:  Seward Carol, MD  Cardiologist:  Rex Kras, DO, [facility] (established care 01/05/2020)  Date: 02/22/22 Last Office Visit: 02/05/2021   Chief Complaint  Patient presents with   Follow-up   Hyperlipidemia   paroxysmal supraventricular tachycardia    HPI  Elizabeth Barron is a 76 y.o. female whose past medical history and cardiovascular risk factors include: Sleep Apnea on CPAP, Right bundle branch block, left anterior fascicular block, hyperlipidemia, hypothyroidism, aortic atherosclerosis (06/22/2019), bilateral adrenal gland nodules (06/22/2019), postmenopausal female, advanced age.  She is referred to the office at the request of her endocrinologist for evaluation of bifascicular block, LVH, fatigue.  Since establishing care patient did undergo cardiovascular workup as outlined below. Her Holter monitor noted underlying rhythm to be sinus and had asymptomatic episodes of PSVT and one episodes of asymptomatic NSVT 4 beats in duration.  We discussed initiating pharmacological therapy; however, since she was asymptomatic she chose conservative therapy.  She now presents for 1 year follow-up visit.  Over the last 1 year patient is doing well from a cardiovascular standpoint.  She denies anginal discomfort or heart failure symptoms.  No ER visits or urgent care encounters for cardiovascular symptoms.  She is not experiencing any palpitations, near-syncope or syncope.  Since last office visit patient has been diagnosed with sleep apnea and is currently on CPAP.  She is making the best effort on using her CPAP on a regular basis but thinks its not helping much.  FUNCTIONAL STATUS: No structured exercise program or daily routine. Limited factors are back and legs.    ALLERGIES: No Known Allergies  MEDICATION LIST PRIOR TO VISIT: Current Meds  Medication Sig   aspirin EC 81 MG tablet Take 81 mg by mouth daily.    Cyanocobalamin (VITAMIN B 12) 500 MCG TABS Take 1,000 mcg by mouth daily.   dorzolamide (TRUSOPT) 2 % ophthalmic solution Place 1 drop into both eyes 2 (two) times daily.   esomeprazole (NEXIUM) 40 MG capsule Take 40 mg by mouth every other day.   FAMOTIDINE PO Take by mouth every other day. Alternate with omeprazole   levothyroxine (SYNTHROID, LEVOTHROID) 175 MCG tablet Take 175 mcg by mouth See admin instructions. Take 175 mg Monday through Friday,  Take 37.5 mg on Saturday,  None on Sunday   Probiotic Product (ALIGN PO) Take 1 tablet by mouth 2 (two) times a week.   rosuvastatin (CRESTOR) 5 MG tablet Take 5 mg by mouth 2 (two) times a week. On Sunday and Wednesday   Vitamin D, Ergocalciferol, (DRISDOL) 50000 units CAPS capsule Take 50,000 Units by mouth every Friday.   Wheat Dextrin (BENEFIBER) POWD Take 5 mLs by mouth daily. Over meal     PAST MEDICAL HISTORY: Past Medical History:  Diagnosis Date   Arthritis    knees. back   Colon polyps    GERD (gastroesophageal reflux disease)    Glaucoma    Headache    Hypercholesteremia    Hypothyroidism    thyroid removed   Insomnia    Low back pain    Obesity    Osteoarthritis    r hip, left hip   Pre-diabetes    Restless leg syndrome    Sleep apnea    uses cpap    PAST SURGICAL HISTORY: Past Surgical History:  Procedure Laterality Date   ANTERIOR CRUCIATE LIGAMENT REPAIR     BREAST LUMPECTOMY  L breast- benign   BUNIONECTOMY Left 2012   COLONOSCOPY WITH PROPOFOL N/A 10/11/2014   Procedure: COLONOSCOPY WITH PROPOFOL;  Surgeon: Garlan Fair, MD;  Location: [facility];  Service: Endoscopy;  Laterality: N/A;   HIP SURGERY Bilateral    08/17/21  10/26/21   KNEE SURGERY     bilateral scope surgery   MAXIMUM ACCESS (MAS)POSTERIOR LUMBAR INTERBODY FUSION (PLIF) 2 LEVEL N/A 12/06/2015   Procedure: Lumbar four-five, Lumbar five-Sacral one MAXIMUM ACCESS POSTERIOR LUMBAR INTERBODY FUSION   ;  Surgeon: Eustace Moore, MD;   Location: [facility];  Service: Neurosurgery;  Laterality: N/A;   RADIOLOGY WITH ANESTHESIA Left 09/05/2016   Procedure: MRI OF LEFT HIP WITHOUT;  Surgeon: Radiologist, Medication, MD;  Location: [place];  Service: Radiology;  Laterality: Left;   RADIOLOGY WITH ANESTHESIA N/A 06/05/2021   Procedure: MRI WITH CERVICAL SPINE WITHOUT CONTRAST;  Surgeon: Radiologist, Medication, MD;  Location: [place];  Service: Radiology;  Laterality: N/A;   THYROIDECTOMY     TONSILLECTOMY  1955   child   TUBAL LIGATION     VAGINAL HYSTERECTOMY  1981    FAMILY HISTORY: The patient family history includes Cancer in her father and mother; Congestive Heart Failure in her sister; Diabetes in her father, mother, and sister; Hypertension in her father and mother; Other in her sister.  SOCIAL HISTORY:  The patient  reports that she has never smoked. She has never used smokeless tobacco. She reports current alcohol use. She reports that she does not use drugs.  REVIEW OF SYSTEMS: Review of Systems  Cardiovascular:  Negative for chest pain, claudication, dyspnea on exertion, irregular heartbeat, leg swelling, near-syncope, orthopnea, palpitations, paroxysmal nocturnal dyspnea and syncope.  Respiratory:  Negative for shortness of breath.   Hematologic/Lymphatic: Negative for bleeding problem.  Musculoskeletal:  Negative for muscle cramps and myalgias.  Neurological:  Negative for dizziness and light-headedness.    PHYSICAL EXAM:    02/22/2022    1:33 PM 02/01/2022   11:46 AM 02/01/2022   10:15 AM  Vitals with BMI  Height _0     Weight 214 lbs 3 oz    BMI 85.63    Systolic 149 702 637  Diastolic 80 65 78  Pulse 74 66 72   CONSTITUTIONAL: Well-developed and well-nourished. No acute distress.  SKIN: Skin is warm and dry. No rash noted. No cyanosis. No pallor. No jaundice HEAD: Normocephalic and atraumatic.  EYES: No scleral icterus MOUTH/THROAT: Moist oral membranes.  NECK: No JVD present. No thyromegaly  noted. No carotid bruits  CHEST Normal respiratory effort. No intercostal retractions  LUNGS: Clear to auscultation bilaterally. No stridor. No wheezes. No rales.  CARDIOVASCULAR: Regular rate and rhythm, positive S1-S2, no murmurs rubs or gallops appreciated. ABDOMINAL: Obese, soft, nontender, nondistended, positive bowel sounds all 4 quadrants. No apparent ascites.  EXTREMITIES: No peripheral edema, compression stockings present, warm to touch, trace left ankle edema HEMATOLOGIC: No significant bruising NEUROLOGIC: Oriented to person, place, and time. Nonfocal. Normal muscle tone.  PSYCHIATRIC: Normal mood and affect. Normal behavior. Cooperative  CARDIAC DATABASE: EKG: 02/22/22: Sinus Rhythm, 74bpm, RBBB,  LAD, LAFB, LAE.  Echocardiogram: 01/14/2020: Normal LV systolic function with visual EF 60-65%. Left ventricle cavity is normal in size. Mild left ventricular hypertrophy.  Normal global wall motion. Normal diastolic filling pattern, normal LAP.  Trace aortic regurgitation. Mild tricuspid regurgitation. No evidence of pulmonary hypertension. No prior study for comparison.   Stress Testing: Lexiscan Tetrofosmin Stress Test  01/17/2020: Nondiagnostic ECG  stress. Mild breast attenuation in the anterior wall noted.   Normal myocardial perfusion. Gated SPECT imaging of the left ventricle was normal. All segments of left ventricle demonstrated normal wall motion and thickening. Stress LV EF is normal 59%.  No previous exam available for comparison. Low risk.   Heart Catheterization: None  7 day extended Holter monitor: Dominant rhythm normal sinus rhythm. Heart rate 53-210 bpm.  Avg HR 83 bpm. No atrial fibrillation, high grade AV block, pauses (3 seconds or longer). Total ventricular ectopic burden <1%.  Longest episode of ventricular bigeminy was 3.9 seconds.   1 episode of NSVT that was 54 beats in duration and at HR ranging between (128-158bpm).  Total supraventricular  ectopic burden <1%.   7 episodes of SVT with average HR of 154 bpm and the longest episode was 6 beats in duration at a rate of 210 bpm. Patient triggered events: 0.    LABORATORY DATA:    Latest Ref Rng & Units 03/20/2021    7:50 AM 06/22/2019   10:33 AM 09/05/2016    6:56 AM  CBC  WBC 4.0 - 10.5 K/uL 7.4  7.5  6.5   Hemoglobin 12.0 - 15.0 g/dL 14.9  15.0  14.9   Hematocrit 36.0 - 46.0 % 47.6  49.2  45.5   Platelets 150 - 400 K/uL 233  238  246        Latest Ref Rng & Units 06/05/2021    7:19 AM 03/20/2021    7:50 AM 06/22/2019   10:33 AM  CMP  Glucose 70 - 99 mg/dL 94  104  118   BUN 8 - 23 mg/dL _0 Creatinine 0.44 - 1.00 mg/dL 0.71  0.82  0.82   Sodium 135 - 145 mmol/L 141  141  141   Potassium 3.5 - 5.1 mmol/L 3.4  3.7  4.3   Chloride 98 - 111 mmol/L 108  105  105   CO2 22 - 32 mmol/L _1 Calcium 8.9 - 10.3 mg/dL 9.6  9.7  9.6     Lipid Panel  No results found for: "CHOL", "TRIG", "HDL", "CHOLHDL", "VLDL", "LDLCALC", "LDLDIRECT", "LABVLDL"  No components found for: "NTPROBNP" No results for input(s): "PROBNP" in the last 8760 hours. No results for input(s): "TSH" in the last 8760 hours.  BMP Recent Labs    03/20/21 0750 06/05/21 0719  NA 141 141  K 3.7 3.4*  CL 105 108  CO2 29 25  GLUCOSE 104* 94  BUN 17 16  CREATININE 0.82 0.71  CALCIUM 9.7 9.6  GFRNONAA >60 >60     HEMOGLOBIN A1C No results found for: "HGBA1C", "MPG"   External Labs: Collected: 12/16/2018 Lipid profile: Total cholesterol 175, triglycerides 64, HDL 56, LDL 106, non HDL 119  Collected: 05/19/2018 Creatinine: 0.8 mg/dL. eGFR: 70 mL/min per 1.73 m  IMPRESSION:    ICD-10-CM   1. PSVT (paroxysmal supraventricular tachycardia)  I47.10 EKG 12-Lead    2. RBBB (right bundle branch block with left anterior fascicular block)  I45.2 EKG 12-Lead    3. Aortic atherosclerosis (HCC)  I70.0     4. Hypercholesteremia  E78.00         RECOMMENDATIONS: Elizabeth Barron is a  76 y.o. female whose past medical history and cardiac risk factors include: Sleep Apnea on CPAP, Right bundle branch block, left anterior fascicular block, hyperlipidemia, hypothyroidism, aortic atherosclerosis (06/22/2019), bilateral adrenal gland nodules (06/22/2019), postmenopausal female,  advanced age.  Referred to the practice by her endocrinologist for evaluation of bifascicular block and LVH.  She underwent appropriate cardiovascular workup as outlined above including a Holter monitor.  Holter monitor did not illustrate any significant dysrhythmias with rare episodes of PSVT.  She was also noted to have short run of NSVT and given her cardiovascular risk factors did undergo MPI which noted a low risk study.  Patient presents today for 1 year follow-up visit.  Overall remains relatively stable.  Denies palpitations, near-syncope or syncope.  EKG is overall nonischemic and no recent hospitalizations or urgent care visits for cardiovascular symptoms since last encounter.  Currently not actively managing any cardiovascular comorbidities and therefore recommended following up on appearing basis.  However, patient states that given her age and other cardiovascular risk factors she would like to continue with annual follow-up visits for now.  Patient is asked to have age-appropriate screening including lipids and A1c.  Patient states that these are currently being checked by her endocrinologist Dr. Forde Dandy.  FINAL MEDICATION LIST END OF ENCOUNTER: No orders of the defined types were placed in this encounter.     Current Outpatient Medications:    aspirin EC 81 MG tablet, Take 81 mg by mouth daily., Disp: , Rfl:    Cyanocobalamin (VITAMIN B 12) 500 MCG TABS, Take 1,000 mcg by mouth daily., Disp: , Rfl:    dorzolamide (TRUSOPT) 2 % ophthalmic solution, Place 1 drop into both eyes 2 (two) times daily., Disp: , Rfl:    esomeprazole (NEXIUM) 40 MG capsule, Take 40 mg by mouth every other day., Disp: ,  Rfl:    FAMOTIDINE PO, Take by mouth every other day. Alternate with omeprazole, Disp: , Rfl:    levothyroxine (SYNTHROID, LEVOTHROID) 175 MCG tablet, Take 175 mcg by mouth See admin instructions. Take 175 mg Monday through Friday,  Take 37.5 mg on Saturday,  None on Sunday, Disp: , Rfl:    Probiotic Product (ALIGN PO), Take 1 tablet by mouth 2 (two) times a week., Disp: , Rfl:    rosuvastatin (CRESTOR) 5 MG tablet, Take 5 mg by mouth 2 (two) times a week. On Sunday and Wednesday, Disp: , Rfl:    Vitamin D, Ergocalciferol, (DRISDOL) 50000 units CAPS capsule, Take 50,000 Units by mouth every Friday., Disp: , Rfl:    Wheat Dextrin (BENEFIBER) POWD, Take 5 mLs by mouth daily. Over meal, Disp: , Rfl:   Orders Placed This Encounter  Procedures   EKG 12-Lead    There are no Patient Instructions on file for this visit.   --Continue cardiac medications as reconciled in final medication list. --Return in about 1 year (around 02/23/2023) for Follow up PSVT. Or sooner if needed. --Continue follow-up with your primary care physician regarding the management of your other chronic comorbid conditions.  Patient's questions and concerns were addressed to her satisfaction. She voices understanding of the instructions provided during this encounter.   This note was created using a voice recognition software as a result there may be grammatical errors inadvertently enclosed that do not reflect the nature of this encounter. Every attempt is made to correct such errors.  Rex Kras, [place], [facility]  Pager: [phone] Office: [phone]

## 2022-02-27 DIAGNOSIS — M5416 Radiculopathy, lumbar region: Secondary | ICD-10-CM | POA: Diagnosis not present

## 2022-03-01 DIAGNOSIS — Z5181 Encounter for therapeutic drug level monitoring: Secondary | ICD-10-CM | POA: Diagnosis not present

## 2022-03-01 DIAGNOSIS — M8580 Other specified disorders of bone density and structure, unspecified site: Secondary | ICD-10-CM | POA: Diagnosis not present

## 2022-03-01 DIAGNOSIS — E039 Hypothyroidism, unspecified: Secondary | ICD-10-CM | POA: Diagnosis not present

## 2022-03-01 DIAGNOSIS — Z79899 Other long term (current) drug therapy: Secondary | ICD-10-CM | POA: Diagnosis not present

## 2022-03-01 DIAGNOSIS — I4510 Unspecified right bundle-branch block: Secondary | ICD-10-CM | POA: Diagnosis not present

## 2022-03-01 DIAGNOSIS — I700 Atherosclerosis of aorta: Secondary | ICD-10-CM | POA: Diagnosis not present

## 2022-03-01 DIAGNOSIS — E7800 Pure hypercholesterolemia, unspecified: Secondary | ICD-10-CM | POA: Diagnosis not present

## 2022-03-01 DIAGNOSIS — G4733 Obstructive sleep apnea (adult) (pediatric): Secondary | ICD-10-CM | POA: Diagnosis not present

## 2022-03-01 DIAGNOSIS — Z0000 Encounter for general adult medical examination without abnormal findings: Secondary | ICD-10-CM | POA: Diagnosis not present

## 2022-03-05 DIAGNOSIS — G4733 Obstructive sleep apnea (adult) (pediatric): Secondary | ICD-10-CM | POA: Diagnosis not present

## 2022-03-26 DIAGNOSIS — M67911 Unspecified disorder of synovium and tendon, right shoulder: Secondary | ICD-10-CM | POA: Diagnosis not present

## 2022-03-27 ENCOUNTER — Other Ambulatory Visit: Payer: Self-pay | Admitting: Internal Medicine

## 2022-03-27 DIAGNOSIS — Z1231 Encounter for screening mammogram for malignant neoplasm of breast: Secondary | ICD-10-CM

## 2022-04-04 DIAGNOSIS — H40153 Residual stage of open-angle glaucoma, bilateral: Secondary | ICD-10-CM | POA: Diagnosis not present

## 2022-04-15 DIAGNOSIS — M5416 Radiculopathy, lumbar region: Secondary | ICD-10-CM | POA: Diagnosis not present

## 2022-05-16 ENCOUNTER — Ambulatory Visit
Admission: RE | Admit: 2022-05-16 | Discharge: 2022-05-16 | Disposition: A | Discharge status: Home / Self Care | Payer: Medicare Other | Source: Ambulatory Visit | Attending: Internal Medicine | Admitting: Internal Medicine

## 2022-05-16 DIAGNOSIS — Z1231 Encounter for screening mammogram for malignant neoplasm of breast: Secondary | ICD-10-CM | POA: Diagnosis not present

## 2022-06-03 DIAGNOSIS — G4733 Obstructive sleep apnea (adult) (pediatric): Secondary | ICD-10-CM | POA: Diagnosis not present

## 2022-06-27 DIAGNOSIS — E559 Vitamin D deficiency, unspecified: Secondary | ICD-10-CM | POA: Diagnosis not present

## 2022-06-27 DIAGNOSIS — G894 Chronic pain syndrome: Secondary | ICD-10-CM | POA: Diagnosis not present

## 2022-06-27 DIAGNOSIS — G4730 Sleep apnea, unspecified: Secondary | ICD-10-CM | POA: Diagnosis not present

## 2022-06-27 DIAGNOSIS — E785 Hyperlipidemia, unspecified: Secondary | ICD-10-CM | POA: Diagnosis not present

## 2022-06-27 DIAGNOSIS — R7301 Impaired fasting glucose: Secondary | ICD-10-CM | POA: Diagnosis not present

## 2022-06-27 DIAGNOSIS — M25512 Pain in left shoulder: Secondary | ICD-10-CM | POA: Diagnosis not present

## 2022-06-27 DIAGNOSIS — M25511 Pain in right shoulder: Secondary | ICD-10-CM | POA: Diagnosis not present

## 2022-06-27 DIAGNOSIS — E890 Postprocedural hypothyroidism: Secondary | ICD-10-CM | POA: Diagnosis not present

## 2022-06-27 DIAGNOSIS — D3500 Benign neoplasm of unspecified adrenal gland: Secondary | ICD-10-CM | POA: Diagnosis not present

## 2022-07-08 ENCOUNTER — Encounter: Payer: Self-pay | Admitting: *Deleted

## 2022-07-10 NOTE — Patient Instructions (Addendum)
Please continue using your CPAP regularly. While your insurance requires that you use CPAP at least 4 hours each night on 70% of the nights, I recommend, that you not skip any nights and use it throughout the night if you can. Getting used to CPAP and staying with the treatment long term does take time and patience and discipline. Untreated obstructive sleep apnea when it is moderate to severe can have an adverse impact on cardiovascular health and raise her risk for heart disease, arrhythmias, hypertension, congestive heart failure, stroke and diabetes. Untreated obstructive sleep apnea causes sleep disruption, nonrestorative sleep, and sleep deprivation. This can have an impact on your day to day functioning and cause daytime sleepiness and impairment of cognitive function, memory loss, mood disturbance, and problems focussing. Using CPAP regularly can improve these symptoms.  We will update supply orders, today. Please continue discussion about insomnia with your PCP. I would recommend you consider psychology referral for cognitive behavior therapy.   Follow up in 6 months

## 2022-07-10 NOTE — Progress Notes (Signed)
PATIENT: Elizabeth Barron DOB: May 01, 1945  REASON FOR VISIT: follow up HISTORY FROM: patient  Chief Complaint  Patient presents with   Room 2    Pt is here Alone. Pt states that she has not had a good 8 hours of sleep. Pt states that the CPAP isn't working for her. Pt states that she tried 1/2 of Benadryl and it didn't help. Pt states that she has taken 1 whole Benadryl. Pt states that she has some Fatigue throughout the day. Pt is wanting to see Dr. Frances Furbish for more options and better clarity.      HISTORY OF PRESENT ILLNESS:  07/17/22 ALL: Elizabeth Barron returns for follow up for OSA on CPAP. She was last seen 06/2021 and having continued difficulty meeting compliance. She has chronic insomnia and reported Tylenol PM was helping. She reports doing a little better with daily usage but continues to have difficulty staying asleep. She feels that benadryl quit working. She has tried and failed multiple sleep aids. Melatonin did not work. Ambien and Lunesta made her feel groggy the next day. She reports going to bed around 11:30-12a. She usually wakes around 5:30am and can not return to sleep. We have discussed CBT in the past but she was not interested. She was considering CBD but hesitant. She is followed regularly by PCP.     01/03/2022 ALL: Elizabeth Barron returns for follow up for OSA on CPAP. She was last seen 06/2021 and had not noted any improvement in sleep quality. She continued to have difficulty initiating and maintaining sleep. We discussed sleep apnea versus insomnia. She was encouraged to continue using CPAP and advised to try melatonin extended release. Since, she reports trying melatonin IR/ER 5mg  for about a week. She did not note any benefit and tried increasing dose to 10mg  for 7-10 days. No benefit. She tried Tylenol PM and feels it has helped some. She is getting to sleep easier but continues to wake up after a few hours. She is usually able to sleep about 4 hours. She has used CPAP fairly  consistently over the past 2 weeks. She does report feeling better rested and less tired.    She had left hip replacement in May and right was done in August. Hip pain is much better. She is having more pain in the right leg.      07/09/2021 ALL: Spring returns for follow up for OSA on CPAP. She was last seen by me in 11/2019 and reported having difficulty with compliance. Dr Frances Furbish saw her last 06/2020 and compliance had improved. She was having difficulty initiating and maintaining sleep and advised to start melatonin 3mg  daily with titration to 6mg  if needed. She reports that it helped her get to sleep but she continued to wake every couple of hours. She has tried multiple rx sleep aids including Ambien and Lunesta without benefit. Sleep aids make her feel "hung over" the next day. She usually goes to bed around 12 midnight. She is usually asleep by 1-1:30. She wakes up a couple hours later and it takes her a little while to get back to sleep. She usually gets out of bed around 5:30-6am. She denies nocturia. She feels that her mind doesn't rest some days. 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 She does chores around the house when she gets home. She may nap for about 10 minutes a few times a week. She lives with her husband.   She  has not been consistent with CPAP usage. She states that she doesn't understand the need if she is not sleeping any better using CPAP. She does not note any significant benefit of using CPAP.     07/05/2020 ALL:  Elizabeth Barron is a 77 y.o. female here today for follow up for recently diagnosed OSA started on CPAP. Sleep study in 06/2019 showed "moderate to severe obstructive sleep apnea, with a total AHI of 17.1/hour, REM AHI of 37.1/hour, and O2 nadir of 85%". CPAP titration showed adequate management of OSA at set pressure of 10cmH20. She reports that she places CPAP therapy every night. She continues to have a very difficult time getting to and  staying asleep. She does feel that there has been some benefit to using CPAP. She feels she may sleep 3-4 hours each night. Sometimes only 2 hours. She does feel that sleep quality has improved since using CPAP. She wakes feeling more refreshed. She has struggled with insomnia for years. She has tried multiple sleep aids that only made her feel sluggish.   Compliance report dated 11/14/2019 3920 2021 reveals that she used CPAP 29 of the past 30 days for compliance of 97%.  She used CPAP greater than 4 hours 29 of the past 30 days for compliance of 97%.  Average usage was 5 hours and 31 minutes.  Residual AHI was 1.0 on 10 cm of water and an EPR of 3.  There is no significant leak noted.  HISTORY: (copied from Dr Teofilo Pod note on 07/05/2019)  Dear Dr. Evlyn Kanner,    I saw your patient, Elizabeth Barron, upon your kind request, in my Neurologic clinic today for initial consultation of her sleep disorder, in particular, concern for underlying obstructive sleep apnea.  The patient is unaccompanied today.  As you know, Ms. Martinezgarcia is a 77 year old right-handed woman with an underlying complex medical history of arthritis, low back pain, hypothyroidism, hyperlipidemia, glaucoma, reflux disease, restless leg syndrome, and morbid obesity with a BMI of over 40, who reports chronic difficulty with sleep initiation and sleep maintenance for many years.  She had a sleep study many years ago, maybe 10 or 15 years ago which was negative for sleep apnea at the time.  Prior sleep study results are not available for my review today.  I reviewed your office note from 06/16/2019.  Her Epworth sleepiness score is 16 out of 24.  She is sleepy during the day and sleeps during the day often.  She has a bedtime typically after midnight and sometimes after 1 AM, sometimes she sleeps better in the recliner because of less leg pain.  She is followed [facility] at [facility].  Her primary care physician has taken her off of her gabapentin  recently in the past 2 weeks or so because of nausea.  She takes Aleve as needed.  She has tried over-the-counter sleep aids including p.m. type medication and melatonin some years ago.  She has tried Ambien which made her groggy or drowsy during the day, she may have tried [place], she is not sure if she tried Belsomra or trazodone.  She typically gets out of bed around 6 AM.  She goes to the bathroom once in the early morning hours.  She has had occasional morning headaches and has symptoms of restless legs but often she has leg pain which comes from the back.  Her sister has a diagnosis of sleep apnea but is not on a CPAP machine.  The patient  is a non-smoker and does not drink alcohol and drinks no caffeine on a daily basis.  She is retired, she lives with her husband.  They have no pets at the house.  She has a TV on at night in her bedroom but typically turns it off when she is ready to fall asleep.   REVIEW OF SYSTEMS: Out of a complete 14 system review of symptoms, the patient complains only of the following symptoms, insomnia and all other reviewed systems are negative.  ESS: 4/24 FSS: 30/63  ALLERGIES: No Known Allergies   HOME MEDICATIONS: Outpatient Medications Prior to Visit  Medication Sig Dispense Refill   aspirin EC 81 MG tablet Take 81 mg by mouth daily.     Cyanocobalamin (VITAMIN B 12) 500 MCG TABS Take 1,000 mcg by mouth daily.     dorzolamide (TRUSOPT) 2 % ophthalmic solution Place 1 drop into both eyes 2 (two) times daily.     esomeprazole (NEXIUM) 40 MG capsule Take 40 mg by mouth every other day.     FAMOTIDINE PO Take by mouth every other day. Alternate with omeprazole     levothyroxine (SYNTHROID, LEVOTHROID) 175 MCG tablet Take 175 mcg by mouth See admin instructions. Take 175 mg Monday through Thursday, 1/2 of Friday Saturday and Sunday None     Probiotic Product (ALIGN PO) Take 1 tablet by mouth 2 (two) times a week.     rosuvastatin (CRESTOR) 5 MG tablet Take 5 mg  by mouth 2 (two) times a week. On Sunday and Wednesday     Vitamin D, Ergocalciferol, (DRISDOL) 50000 units CAPS capsule Take 50,000 Units by mouth every Friday.     Wheat Dextrin (BENEFIBER) POWD Take 5 mLs by mouth daily. Over meal     No facility-administered medications prior to visit.    PAST MEDICAL HISTORY: Past Medical History:  Diagnosis Date   Arthritis    knees. back   Colon polyps    GERD (gastroesophageal reflux disease)    Glaucoma    Headache    Hypercholesteremia    Hypothyroidism    thyroid removed   Insomnia    Low back pain    Obesity    Osteoarthritis    r hip, left hip   Pre-diabetes    Restless leg syndrome    Sleep apnea    uses cpap    PAST SURGICAL HISTORY: Past Surgical History:  Procedure Laterality Date   ANTERIOR CRUCIATE LIGAMENT REPAIR     BREAST LUMPECTOMY     L breast- benign   BUNIONECTOMY Left 2012   COLONOSCOPY WITH PROPOFOL N/A 10/11/2014   Procedure: COLONOSCOPY WITH PROPOFOL;  Surgeon: Charolett Bumpers, MD;  Location: [facility];  Service: Endoscopy;  Laterality: N/A;   HIP SURGERY Bilateral    08/17/21  10/26/21   HIP SURGERY     May and August 2023   KNEE SURGERY     bilateral scope surgery   MAXIMUM ACCESS (MAS)POSTERIOR LUMBAR INTERBODY FUSION (PLIF) 2 LEVEL N/A 12/06/2015   Procedure: Lumbar four-five, Lumbar five-Sacral one MAXIMUM ACCESS POSTERIOR LUMBAR INTERBODY FUSION   ;  Surgeon: Tia Alert, MD;  Location: [facility];  Service: Neurosurgery;  Laterality: N/A;   RADIOLOGY WITH ANESTHESIA Left 09/05/2016   Procedure: MRI OF LEFT HIP WITHOUT;  Surgeon: Radiologist, Medication, MD;  Location: [facility] OR;  Service: Radiology;  Laterality: Left;   RADIOLOGY WITH ANESTHESIA N/A 06/05/2021   Procedure: MRI WITH CERVICAL SPINE WITHOUT CONTRAST;  Surgeon:  Radiologist, Medication, MD;  Location: [facility] OR;  Service: Radiology;  Laterality: N/A;   THYROIDECTOMY     TONSILLECTOMY  1955   child   TUBAL LIGATION     VAGINAL  HYSTERECTOMY  1981    FAMILY HISTORY: Family History  Problem Relation Age of Onset   Cancer Mother        pancreatic   Diabetes Mother    Hypertension Mother    Cancer Father        esophageal   Diabetes Father    Hypertension Father    Diabetes Sister    Other Sister        dialysis   Congestive Heart Failure Sister     SOCIAL HISTORY: Social History   Socioeconomic History   Marital status: Married    Spouse name: Not on file   Number of children: 1   Years of education: Not on file   Highest education level: High school graduate  Occupational History    Employer: RETIRED  Tobacco Use   Smoking status: Never   Smokeless tobacco: Never  Vaping Use   Vaping Use: Never used  Substance and Sexual Activity   Alcohol use: Yes    Comment: OCCASIONAL WINE   Drug use: No   Sexual activity: Not on file  Other Topics Concern   Not on file  Social History Narrative   Lives with spouse   caffeine none   Social Determinants of Health   Financial Resource Strain: Not on file  Food Insecurity: Not on file  Transportation Needs: Not on file  Physical Activity: Not on file  Stress: Not on file  Social Connections: Not on file  Intimate Partner Violence: Not on file    PHYSICAL EXAM  Vitals:   07/16/22 1052  BP: (!) 145/74  Pulse: 67  Weight: 216 lb (98 kg)  Height:  (1.575 m)      Body mass index is 39.51 kg/m.  Generalized: Well developed, in no acute distress  Cardiology: normal rate and rhythm, no murmur noted Respiratory: clear to auscultation bilaterally  Neurological examination  Mentation: Alert oriented to time, place, history taking. Follows all commands speech and language fluent Cranial nerve II-XII: Pupils were equal round reactive to light. Extraocular movements were full, visual field were full  Motor: The motor testing reveals 5 over 5 strength of all 4 extremities. Good symmetric motor tone is noted throughout.  Gait and station:  Gait is stable with Rolator     DIAGNOSTIC DATA (LABS, IMAGING, TESTING) - I reviewed patient records, labs, notes, testing and imaging myself where available.      No data to display           Lab Results  Component Value Date   WBC 7.4 03/20/2021   HGB 14.9 03/20/2021   HCT 47.6 (H) 03/20/2021   MCV 87.3 03/20/2021   PLT 233 03/20/2021      Component Value Date/Time   NA 141 06/05/2021 0719   K 3.4 (L) 06/05/2021 0719   CL 108 06/05/2021 0719   CO2 25 06/05/2021 0719   GLUCOSE 94 06/05/2021 0719   BUN 16 06/05/2021 0719   CREATININE 0.71 06/05/2021 0719   CALCIUM 9.6 06/05/2021 0719   GFRNONAA >60 06/05/2021 0719   GFRAA >60 06/22/2019 1033   No results found for: "CHOL", "HDL", "LDLCALC", "LDLDIRECT", "TRIG", "CHOLHDL" No results found for: "HGBA1C" No results found for: "VITAMINB12" No results found for: "TSH"  ASSESSMENT AND PLAN 77 y.o. year old female  has a past medical history of Arthritis, Colon polyps, GERD (gastroesophageal reflux disease), Glaucoma, Headache, Hypercholesteremia, Hypothyroidism, Insomnia, Low back pain, Obesity, Osteoarthritis, Pre-diabetes, Restless leg syndrome, and Sleep apnea. here with     ICD-10-CM   1. OSA on CPAP  G47.33 For home use only DME continuous positive airway pressure (CPAP)     Arantza reports continued difficulty with chronic insomnia. She continues to wake up after about 4-5 hours of sleeping. She has failed multiple sleep aids. We have discussed option of referral to psychology for CBT. She is aware she may reach out to me if she wishes to pursue. She was encouraged to use CPAP nightly for at least 4 hours. Sleep hygiene recommended. She will continue to follow-up with primary care.  Healthy lifestyle habits encouraged. She will follow-up with [place] in 6 months, sooner if needed. She verbalizes understanding and agreement with this plan.   Orders Placed This Encounter  Procedures   For home use only DME continuous  positive airway pressure (CPAP)    Supplies    Order Specific Question:   Length of Need    Answer:   Lifetime    Order Specific Question:   Patient has OSA or probable OSA    Answer:   Yes    Order Specific Question:   Is the patient currently using CPAP in the home    Answer:   Yes    Order Specific Question:   Settings    Answer:   Other see comments    Order Specific Question:   CPAP supplies needed    Answer:   Mask, headgear, cushions, filters, heated tubing and water chamber     No orders of the defined types were placed in this encounter.      Shawnie Dapper, FNP-C 07/17/2022, 3:55 PM [facility] [address] [place], [place] [address] [phone]

## 2022-07-16 ENCOUNTER — Ambulatory Visit: Payer: Medicare Other | Admitting: Family Medicine

## 2022-07-16 ENCOUNTER — Encounter: Payer: Self-pay | Admitting: Family Medicine

## 2022-07-16 VITALS — BP 145/74 | HR 67 | Ht 62.0 in | Wt 216.0 lb

## 2022-07-16 DIAGNOSIS — G4733 Obstructive sleep apnea (adult) (pediatric): Secondary | ICD-10-CM | POA: Diagnosis not present

## 2022-07-25 DIAGNOSIS — H40153 Residual stage of open-angle glaucoma, bilateral: Secondary | ICD-10-CM | POA: Diagnosis not present

## 2022-09-02 DIAGNOSIS — G4733 Obstructive sleep apnea (adult) (pediatric): Secondary | ICD-10-CM | POA: Diagnosis not present

## 2022-10-05 IMAGING — MG MM DIGITAL DIAGNOSTIC UNILAT*R*
4 series · 4 of 8 positions shown · non-contrast
Comparison: Previous exam(s).

CLINICAL DATA: Patient recalled from screening for right breast
calcifications.

EXAM:
DIGITAL DIAGNOSTIC UNILATERAL RIGHT MAMMOGRAM
TECHNIQUE: Right digital diagnostic mammography was performed. Mammographic
images were processed with CAD.

[R ML]
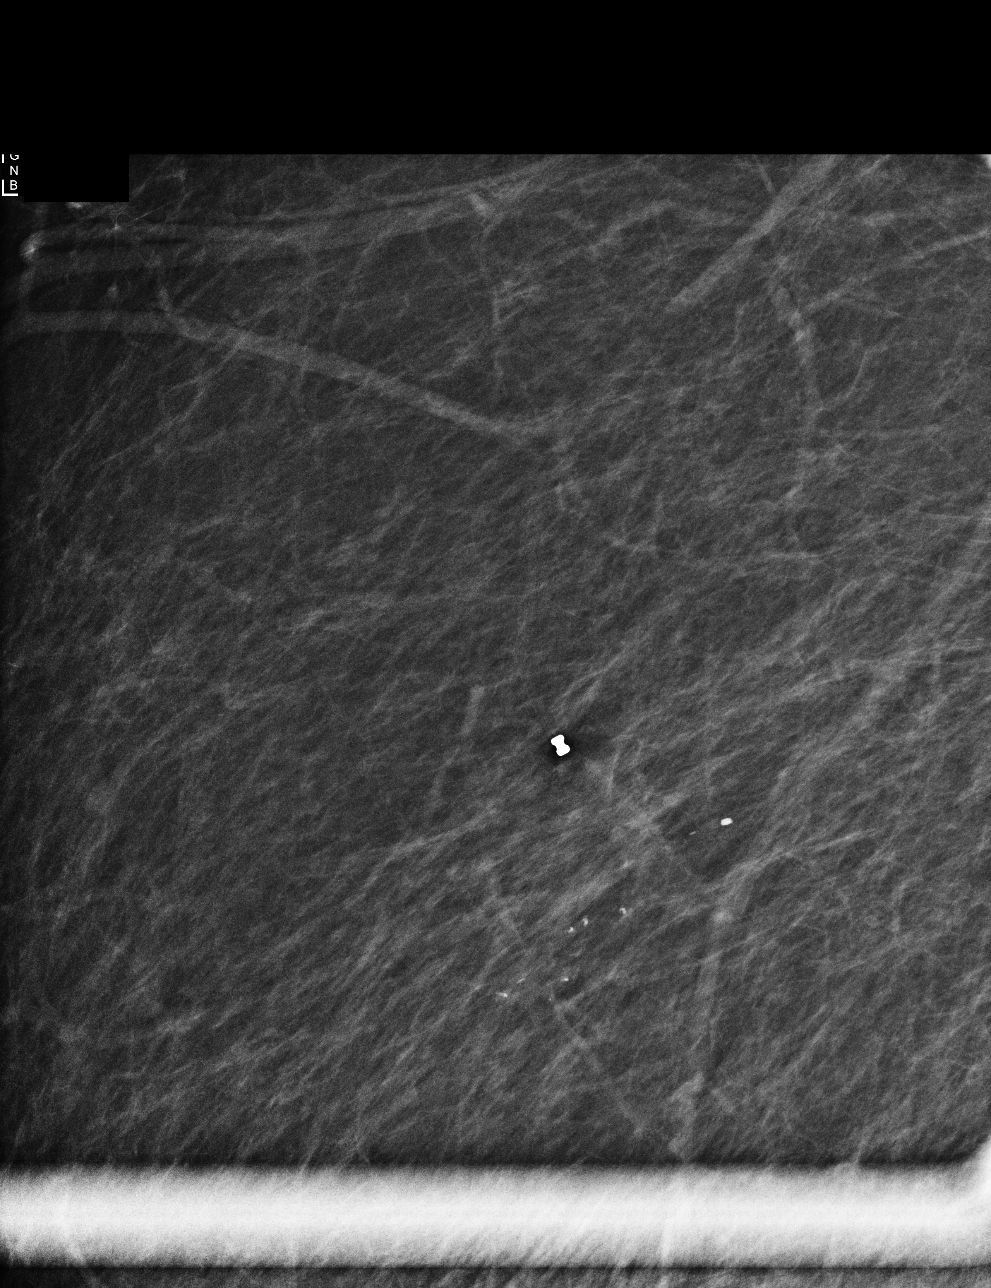

[R CC]
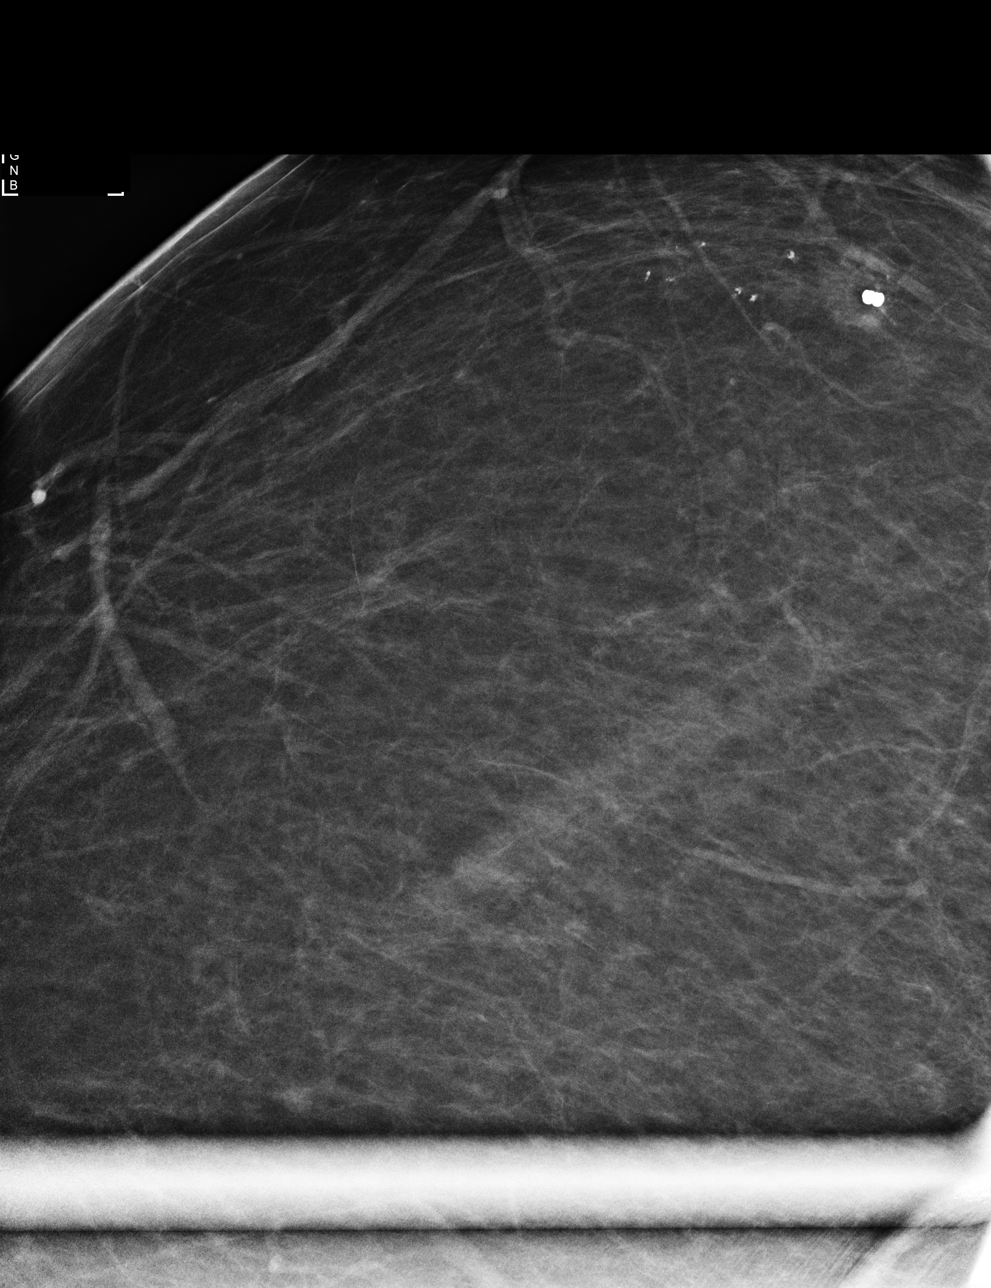

[R ML synth-2D]
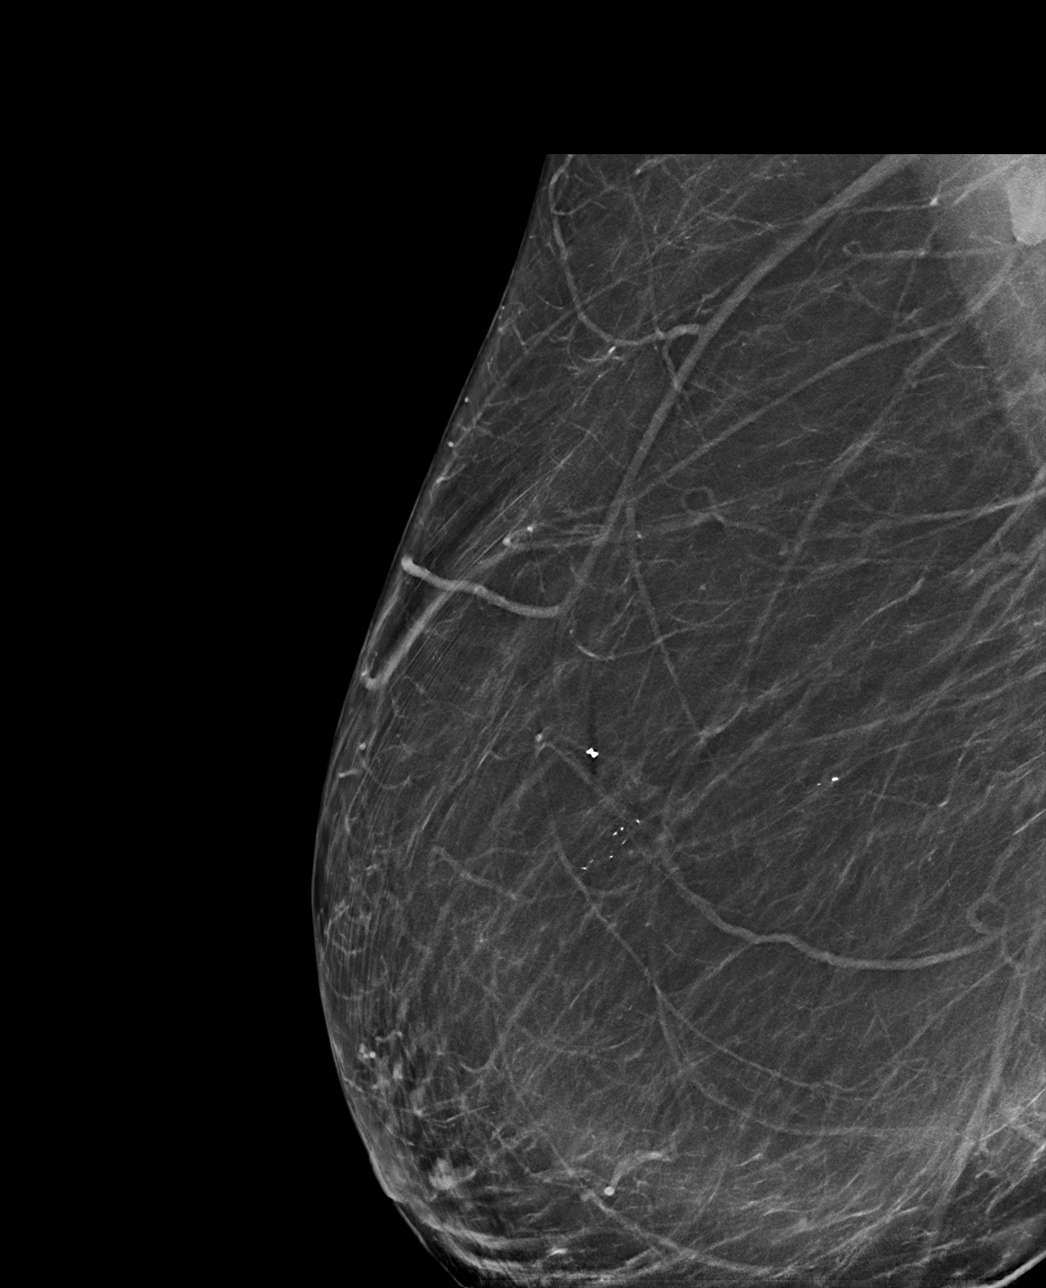

[R ML tomo · tomo slice 44/87.0]
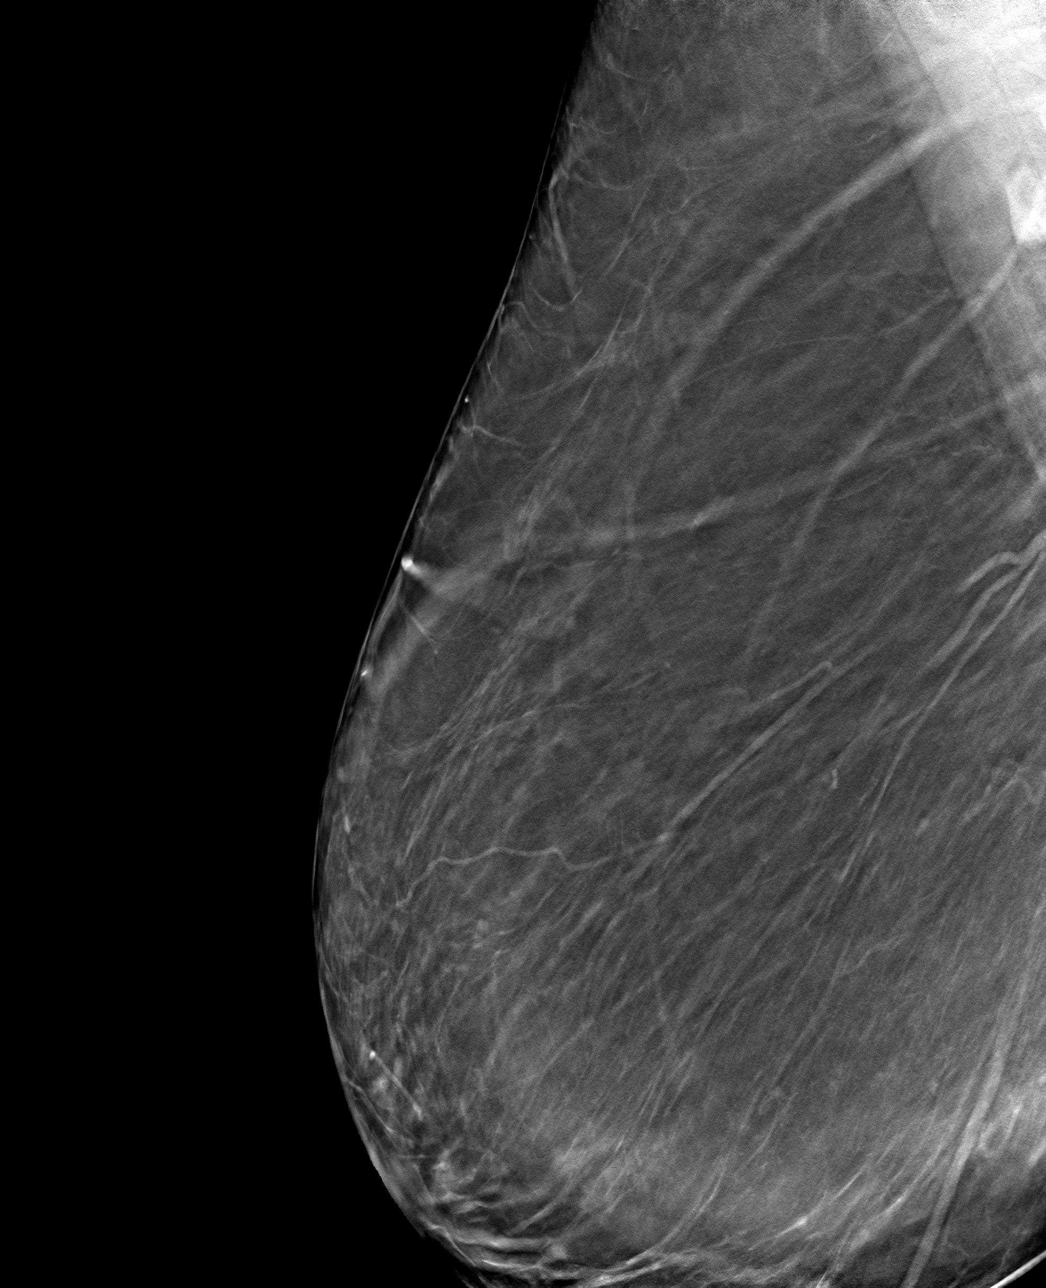

[4 of 8 positions shown; findings below may reference images not displayed]

ACR Breast Density Category b: There are scattered areas of
fibroglandular density.
FINDINGS: Within the upper-outer right breast there is a 1.5 cm group of
coarse heterogeneous calcifications further evaluated with
magnification views.
IMPRESSION: Indeterminate right breast calcifications.

RECOMMENDATION:
Stereotactic guided core needle biopsy right breast calcifications.

I have discussed the findings and recommendations with the patient.
If applicable, a reminder letter will be sent to the patient
regarding the next appointment.

BI-RADS CATEGORY  4: Suspicious.

## 2022-10-10 IMAGING — MR MR CERVICAL SPINE W/O CM
4 of 6 series · 16 of 48 positions shown · non-contrast
Comparison: No prior MRI

CLINICAL DATA: Acute on chronic neck pain with left-sided radicular
pain down her arm

EXAM:
MRI CERVICAL SPINE WITHOUT CONTRAST
TECHNIQUE: Multiplanar, multisequence MR imaging of the cervical spine was
performed. No intravenous contrast was administered.

[Series 3: T2 · sagittal · 3.0mm · 0.35mm/px · 4 of 16 slices shown (1 of 2)]
[im 1/16]
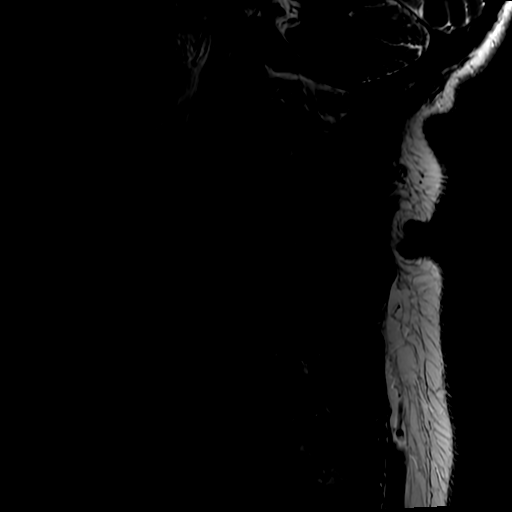
[im 6/16]
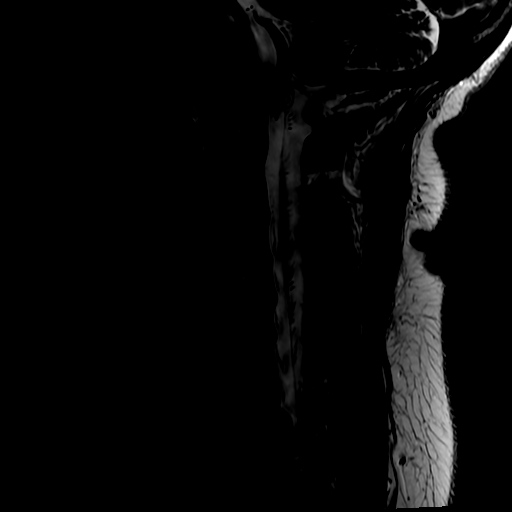
[im 11/16]
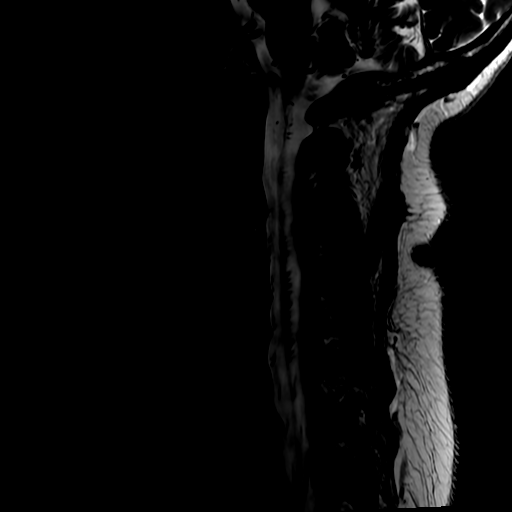
[im 16/16]
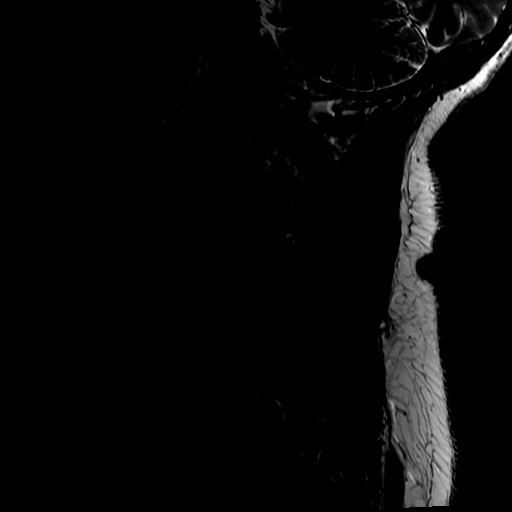

[Series 4: FLAIR · sagittal · 3.0mm · 0.35mm/px · 3 of 16 slices shown]
[im 1/16]
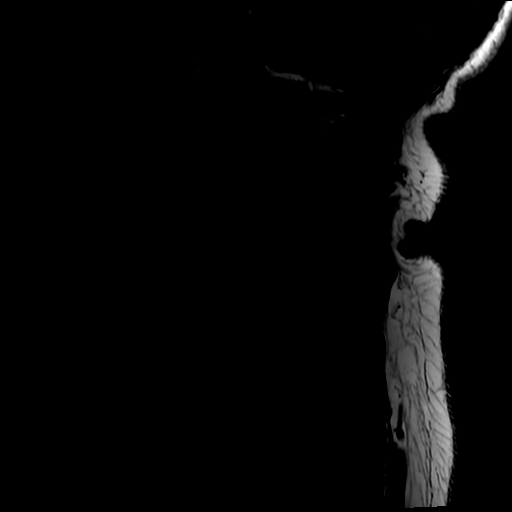
[im 8/16]
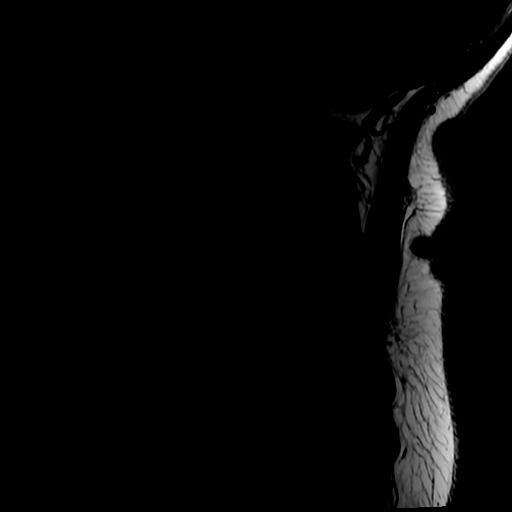
[im 16/16]
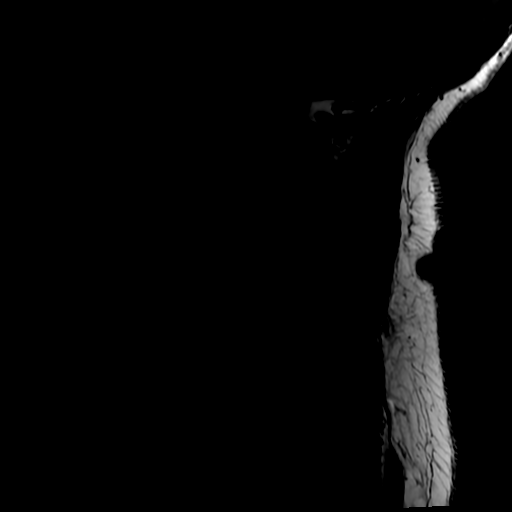

[Series 5: STIR · sagittal · 3.0mm · 0.35mm/px · 3 of 16 slices shown]
[im 1/16]
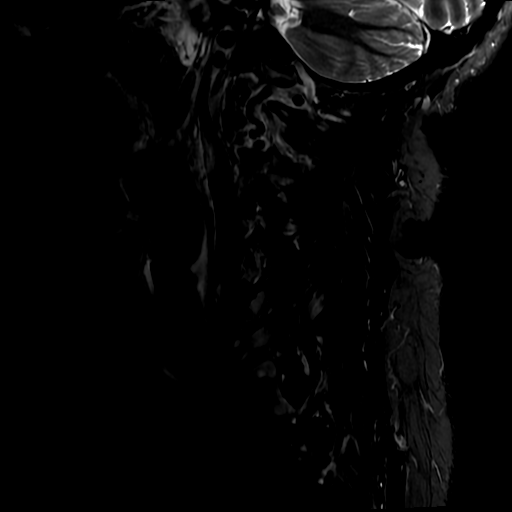
[im 8/16]
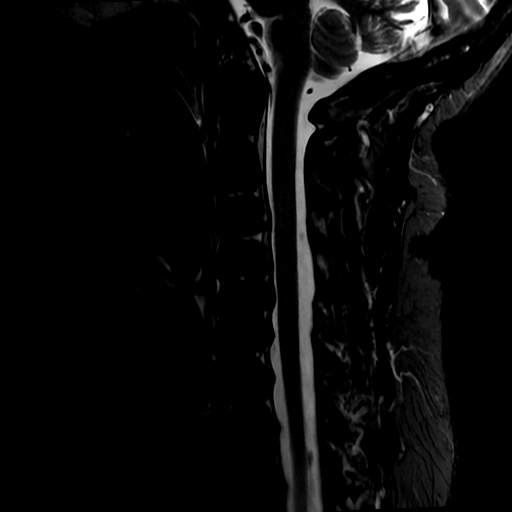
[im 16/16]
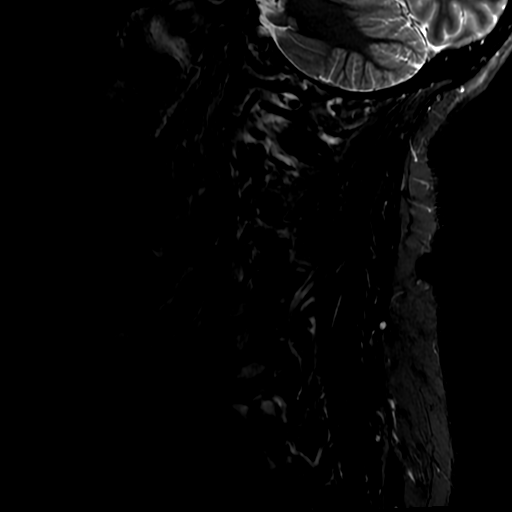

[Series 7: T2 · axial · 3.0mm · 0.31mm/px · z∈[-48,+35]mm · 6 of 27 slices shown (2 of 2)]
[im 1/27]
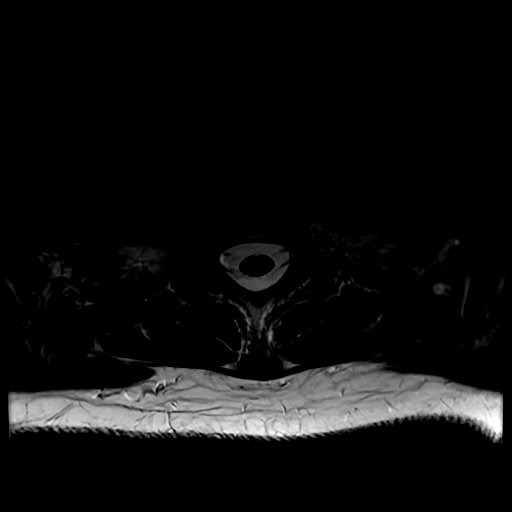
[im 6/27]
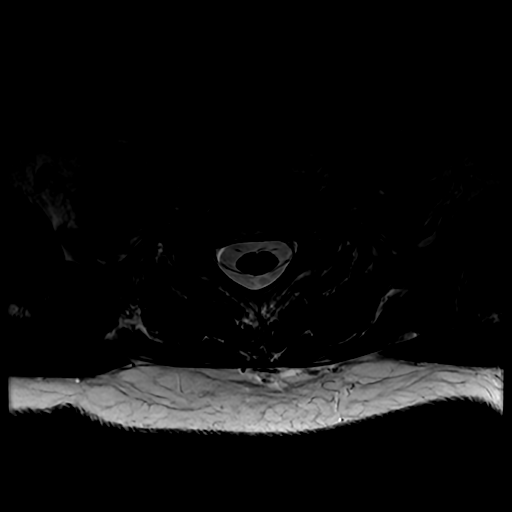
[im 11/27]
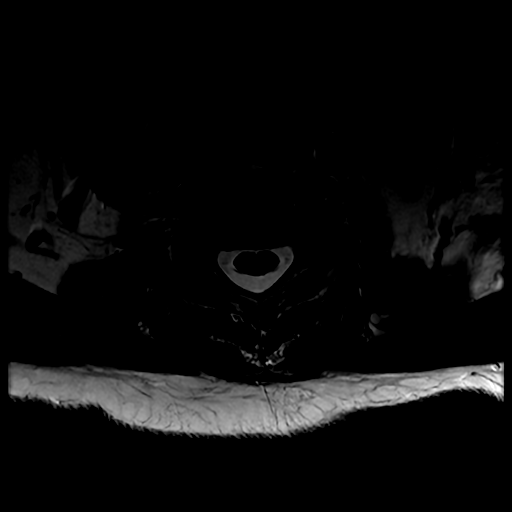
[im 16/27]
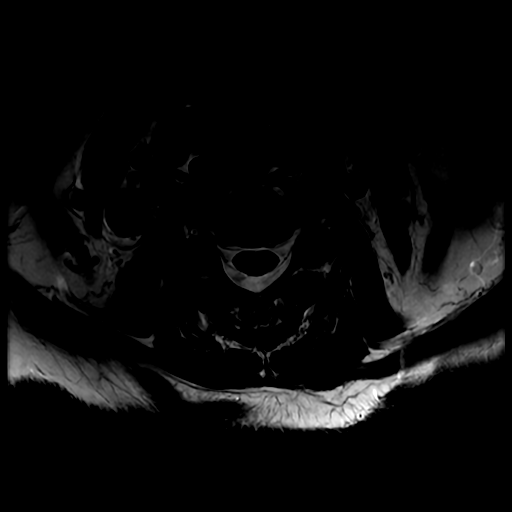
[im 21/27]
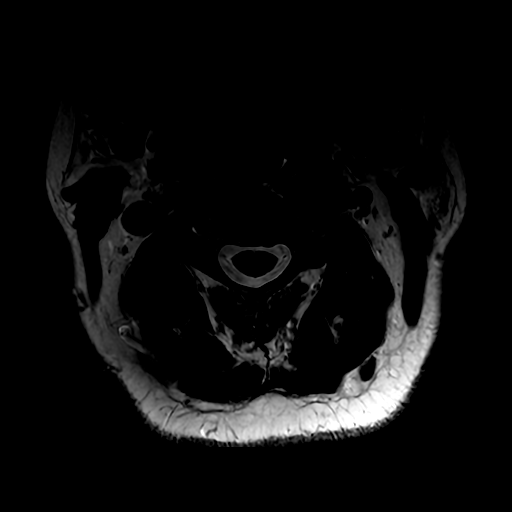
[im 27/27]
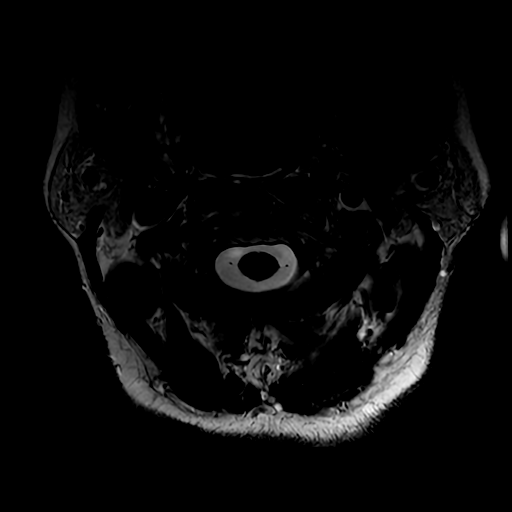

[16 of 48 positions shown; findings below may reference images not displayed]

FINDINGS: Alignment: Straightening of the normal cervical lordosis. No
listhesis.

Vertebrae: No acute fracture or suspicious osseous lesion.

Cord: Normal signal and morphology.

Posterior Fossa, vertebral arteries, paraspinal tissues: Negative.

Disc levels:

C2-C3: No significant disc bulge. No spinal canal stenosis or
neuroforaminal narrowing.

C3-C4: No significant disc bulge. No spinal canal stenosis or
neuroforaminal narrowing.

C4-C5: No significant disc bulge. No spinal canal stenosis or
neuroforaminal narrowing.

C5-C6: Mild disc bulge. No spinal canal stenosis. Mild bilateral
neural foraminal narrowing.

C6-C7: Mild disc bulge. No spinal canal stenosis. Mild bilateral
neural foraminal narrowing.

C7-T1: Right eccentric disc bulge. No spinal canal stenosis. Mild
right neural foraminal narrowing.
IMPRESSION: 1. No spinal canal stenosis.
2. Mild neural foraminal narrowing bilaterally at C5-C6 and C6-C7,
as well as on the right at C7-T1

## 2022-10-16 DIAGNOSIS — R350 Frequency of micturition: Secondary | ICD-10-CM | POA: Diagnosis not present

## 2022-12-02 DIAGNOSIS — H40153 Residual stage of open-angle glaucoma, bilateral: Secondary | ICD-10-CM | POA: Diagnosis not present

## 2022-12-23 DIAGNOSIS — N6019 Diffuse cystic mastopathy of unspecified breast: Secondary | ICD-10-CM | POA: Diagnosis not present

## 2022-12-23 DIAGNOSIS — D649 Anemia, unspecified: Secondary | ICD-10-CM | POA: Diagnosis not present

## 2022-12-23 DIAGNOSIS — G4730 Sleep apnea, unspecified: Secondary | ICD-10-CM | POA: Diagnosis not present

## 2022-12-23 DIAGNOSIS — R7301 Impaired fasting glucose: Secondary | ICD-10-CM | POA: Diagnosis not present

## 2022-12-23 DIAGNOSIS — E890 Postprocedural hypothyroidism: Secondary | ICD-10-CM | POA: Diagnosis not present

## 2022-12-23 DIAGNOSIS — E559 Vitamin D deficiency, unspecified: Secondary | ICD-10-CM | POA: Diagnosis not present

## 2022-12-23 DIAGNOSIS — I700 Atherosclerosis of aorta: Secondary | ICD-10-CM | POA: Diagnosis not present

## 2022-12-23 DIAGNOSIS — E785 Hyperlipidemia, unspecified: Secondary | ICD-10-CM | POA: Diagnosis not present

## 2022-12-23 DIAGNOSIS — Z23 Encounter for immunization: Secondary | ICD-10-CM | POA: Diagnosis not present

## 2022-12-23 DIAGNOSIS — G894 Chronic pain syndrome: Secondary | ICD-10-CM | POA: Diagnosis not present

## 2023-01-08 DIAGNOSIS — J069 Acute upper respiratory infection, unspecified: Secondary | ICD-10-CM | POA: Diagnosis not present

## 2023-01-08 DIAGNOSIS — R051 Acute cough: Secondary | ICD-10-CM | POA: Diagnosis not present

## 2023-01-08 DIAGNOSIS — Z03818 Encounter for observation for suspected exposure to other biological agents ruled out: Secondary | ICD-10-CM | POA: Diagnosis not present

## 2023-01-08 DIAGNOSIS — I700 Atherosclerosis of aorta: Secondary | ICD-10-CM | POA: Diagnosis not present

## 2023-02-24 ENCOUNTER — Ambulatory Visit: Payer: Self-pay | Admitting: Cardiology

## 2023-03-07 DIAGNOSIS — G8929 Other chronic pain: Secondary | ICD-10-CM | POA: Diagnosis not present

## 2023-03-07 DIAGNOSIS — Z5181 Encounter for therapeutic drug level monitoring: Secondary | ICD-10-CM | POA: Diagnosis not present

## 2023-03-07 DIAGNOSIS — R7303 Prediabetes: Secondary | ICD-10-CM | POA: Diagnosis not present

## 2023-03-07 DIAGNOSIS — Z0000 Encounter for general adult medical examination without abnormal findings: Secondary | ICD-10-CM | POA: Diagnosis not present

## 2023-03-07 DIAGNOSIS — E7800 Pure hypercholesterolemia, unspecified: Secondary | ICD-10-CM | POA: Diagnosis not present

## 2023-03-07 DIAGNOSIS — I700 Atherosclerosis of aorta: Secondary | ICD-10-CM | POA: Diagnosis not present

## 2023-03-07 DIAGNOSIS — G4733 Obstructive sleep apnea (adult) (pediatric): Secondary | ICD-10-CM | POA: Diagnosis not present

## 2023-03-07 DIAGNOSIS — I4510 Unspecified right bundle-branch block: Secondary | ICD-10-CM | POA: Diagnosis not present

## 2023-03-07 DIAGNOSIS — M25512 Pain in left shoulder: Secondary | ICD-10-CM | POA: Diagnosis not present

## 2023-03-07 DIAGNOSIS — E039 Hypothyroidism, unspecified: Secondary | ICD-10-CM | POA: Diagnosis not present

## 2023-03-07 DIAGNOSIS — G4700 Insomnia, unspecified: Secondary | ICD-10-CM | POA: Diagnosis not present

## 2023-03-11 ENCOUNTER — Encounter: Payer: Self-pay | Admitting: Cardiology

## 2023-03-11 ENCOUNTER — Ambulatory Visit: Payer: Medicare Other | Attending: Cardiology | Admitting: Cardiology

## 2023-03-11 VITALS — BP 122/82 | HR 103 | Resp 16 | Ht 62.0 in | Wt 220.0 lb

## 2023-03-11 DIAGNOSIS — I471 Supraventricular tachycardia, unspecified: Secondary | ICD-10-CM

## 2023-03-11 DIAGNOSIS — I452 Bifascicular block: Secondary | ICD-10-CM

## 2023-03-11 DIAGNOSIS — E7800 Pure hypercholesterolemia, unspecified: Secondary | ICD-10-CM

## 2023-03-11 DIAGNOSIS — I700 Atherosclerosis of aorta: Secondary | ICD-10-CM | POA: Diagnosis not present

## 2023-03-11 NOTE — Progress Notes (Signed)
Cardiology Office Note:  .   Date:  03/11/2023  ID:  Elizabeth Barron, DOB May 13, 1945, MRN [id number] PCP:  Renford Dills, MD  Former Cardiology Providers: None [facility] Providers Cardiologist:  None , [facility] (established care 01/05/2020) Electrophysiologist:  None  Click to update primary MD,subspecialty MD or APP then REFRESH:1}    Chief Complaint  Patient presents with   paroxysmal supraventricular tachycardia   Follow-up    History of Present Illness: Elizabeth Barron is a 77 y.o.  female whose past medical history and cardiovascular risk factors includes: Sleep Apnea on CPAP, Right bundle branch block, left anterior fascicular block, hyperlipidemia, hypothyroidism, aortic atherosclerosis (06/22/2019), bilateral adrenal gland nodules (06/22/2019), postmenopausal female, advanced age.   Patient was referred to the practice by endocrinology for evaluation of bifascicular block, LVH and fatigue.  She has undergone appropriate cardiovascular workup initially including cardiac monitor which noted underlying rhythm to be sinus with asymptomatic episodes of PSVT and auto triggered events of NSVT.  She has undergone appropriate ischemic workup as well since establishing care.  She presents today for 1 year follow-up visit.  Over the last 1 year she denies any anginal chest pain or heart failure symptoms.  No near-syncope or syncopal events.  Review of Systems: .   Review of Systems  Cardiovascular:  Negative for chest pain, claudication, irregular heartbeat, leg swelling, near-syncope, orthopnea, palpitations, paroxysmal nocturnal dyspnea and syncope.  Respiratory:  Negative for shortness of breath.   Hematologic/Lymphatic: Negative for bleeding problem.    Studies Reviewed:   EKG: EKG Interpretation Date/Time:  Tuesday March 11 2023 14:32:39 EST Ventricular Rate:  105 Text Interpretation: Sinus tachycardia Right bundle branch block Left anterior fascicular block  Bifascicular block Minimal voltage criteria for LVH, may be normal variant ( R in aVL ) Septal infarct , age undetermined When compared with ECG of 20-Mar-2021 07:51, HEART RATE INCREASED SINCE Since last tracing Confirmed by Tessa Lerner [phone]) on 03/11/2023 2:40:25 PM  Echocardiogram: 01/14/2020 LVEF 60 to 65%. Mild LVH. Normal diastolic filling pattern. Trace aortic regurgitation. Mild tricuspid regurgitation See report for additional details  Stress Testing: Lexiscan stress test October 2021: Low risk study.  See report for additional details  RADIOLOGY: NA  Risk Assessment/Calculations:   NA  Labs:       Latest Ref Rng & Units 03/20/2021    7:50 AM 06/22/2019   10:33 AM 09/05/2016    6:56 AM  CBC  WBC 4.0 - 10.5 K/uL 7.4  7.5  6.5   Hemoglobin 12.0 - 15.0 g/dL 19.1  47.8  29.5   Hematocrit 36.0 - 46.0 % 47.6  49.2  45.5   Platelets 150 - 400 K/uL 233  238  246        Latest Ref Rng & Units 06/05/2021    7:19 AM 03/20/2021    7:50 AM 06/22/2019   10:33 AM  BMP  Glucose 70 - 99 mg/dL 94  621  308   BUN 8 - 23 mg/dL 16  17  8    Creatinine 0.44 - 1.00 mg/dL 6.57  8.46  9.62   Sodium 135 - 145 mmol/L 141  141  141   Potassium 3.5 - 5.1 mmol/L 3.4  3.7  4.3   Chloride 98 - 111 mmol/L 108  105  105   CO2 22 - 32 mmol/L 25  29  28    Calcium 8.9 - 10.3 mg/dL 9.6  9.7  9.6  Latest Ref Rng & Units 06/05/2021    7:19 AM 03/20/2021    7:50 AM 06/22/2019   10:33 AM  CMP  Glucose 70 - 99 mg/dL 94  161  096   BUN 8 - 23 mg/dL 16  17  8    Creatinine 0.44 - 1.00 mg/dL 0.45  4.09  8.11   Sodium 135 - 145 mmol/L 141  141  141   Potassium 3.5 - 5.1 mmol/L 3.4  3.7  4.3   Chloride 98 - 111 mmol/L 108  105  105   CO2 22 - 32 mmol/L 25  29  28    Calcium 8.9 - 10.3 mg/dL 9.6  9.7  9.6     No results found for: "CHOL", "HDL", "LDLCALC", "LDLDIRECT", "TRIG", "CHOLHDL" No results for input(s): "LIPOA" in the last 8760 hours. No components found for: "NTPROBNP" No results  for input(s): "PROBNP" in the last 8760 hours. No results for input(s): "TSH" in the last 8760 hours.  External Labs: Collected: October 2024 available in Care Everywhere. BUN 28, creatinine 0.66. Sodium 139, potassium 5.1, chloride 99. AST, ALT, alkaline phosphatase within normal limits. Total cholesterol 183, triglycerides 164, HDL 71, LDL calculated 84  Physical Exam:    Today's Vitals   03/11/23 1427  BP: 122/82  Pulse: (!) 103  Resp: 16  SpO2: 95%  Weight: 220 lb (99.8 kg)  Height: 5\' 2"  (1.575 m)   Body mass index is 40.24 kg/m. Wt Readings from Last 3 Encounters:  03/11/23 220 lb (99.8 kg)  07/16/22 216 lb (98 kg)  02/22/22 214 lb 3.2 oz (97.2 kg)    Physical Exam  Constitutional: No distress.  hemodynamically stable  Neck: No JVD present.  Cardiovascular: Normal rate, regular rhythm, S1 normal and S2 normal. Exam reveals no gallop, no S3 and no S4.  No murmur heard. Pulmonary/Chest: Effort normal and breath sounds normal. No stridor. She has no wheezes. She has no rales.  Abdominal: Soft. Bowel sounds are normal. She exhibits no distension. There is no abdominal tenderness.  Musculoskeletal:        General: No edema.     Cervical back: Neck supple.  Neurological: She is alert and oriented to person, place, and time. She has intact cranial nerves (2-12).  Skin: Skin is warm.   Impression & Recommendation(s):  Impression:   ICD-10-CM   1. PSVT (paroxysmal supraventricular tachycardia) (HCC)  I47.10 EKG 12-Lead    2. RBBB (right bundle branch block with left anterior fascicular block)  I45.2     3. Aortic atherosclerosis (HCC)  I70.0     4. Hypercholesteremia  E78.00        Recommendation(s):  PSVT (paroxysmal supraventricular tachycardia) (HCC) Has undergone appropriate cardiac workup including a cardiac monitor in the past. EKG today illustrates sinus tachycardia with right bundle branch block. She is currently not on AV blocking agents at this  time. Patient states that home her heart rate/pulse is usually less than 100 bpm.   She is attributing her current tachycardia to walking a long distance from the parking lot to the office. No palpitations. Asked her to monitor for now.    Aortic atherosclerosis (HCC) Denies anginal chest pain or heart failure symptoms. Currently on aspirin and statin therapy. Outside labs independently reviewed for further reference. Prior ischemic workup reviewed as part of medical decision making at today's office visit  Hypercholesteremia Currently on Crestor 5 mg p.o. daily.   She denies myalgia or other side effects. Most recent  lipids dated October 2024 from Care Everywhere independently reviewed as noted above.  Orders Placed:  Orders Placed This Encounter  Procedures   EKG 12-Lead    Final Medication List:   No orders of the defined types were placed in this encounter.   There are no discontinued medications.   Current Outpatient Medications:    aspirin EC 81 MG tablet, Take 81 mg by mouth daily., Disp: , Rfl:    Cyanocobalamin (VITAMIN B 12) 500 MCG TABS, Take 1,000 mcg by mouth daily., Disp: , Rfl:    dorzolamide (TRUSOPT) 2 % ophthalmic solution, Place 1 drop into both eyes 2 (two) times daily., Disp: , Rfl:    esomeprazole (NEXIUM) 40 MG capsule, Take 40 mg by mouth every other day., Disp: , Rfl:    FAMOTIDINE PO, Take by mouth every other day. Alternate with omeprazole, Disp: , Rfl:    levothyroxine (SYNTHROID, LEVOTHROID) 175 MCG tablet, Take 175 mcg by mouth See admin instructions. Take 175 mg Monday through Thursday, 1/2 of Friday Saturday and Sunday None, Disp: , Rfl:    Probiotic Product (ALIGN PO), Take 1 tablet by mouth 2 (two) times a week., Disp: , Rfl:    rosuvastatin (CRESTOR) 5 MG tablet, Take 5 mg by mouth 2 (two) times a week. On Sunday and Wednesday, Disp: , Rfl:    Vitamin D, Ergocalciferol, (DRISDOL) 50000 units CAPS capsule, Take 50,000 Units by mouth every  Friday., Disp: , Rfl:    Wheat Dextrin (BENEFIBER) POWD, Take 5 mLs by mouth daily. Over meal, Disp: , Rfl:   Consent:   NA  Disposition:   1 year follow-up  Patient may be asked to follow-up sooner based on the results of the above-mentioned testing.  Her questions and concerns were addressed to her satisfaction. She voices understanding of the recommendations provided during this encounter.    Signed, Tessa Lerner, DO, [facility] [place]  [facility]  [address] [place], [place] [address] 03/11/2023 3:13 PM

## 2023-03-11 NOTE — Patient Instructions (Signed)
Medication Instructions:   Your physician recommends that you continue on your current medications as directed. Please refer to the Current Medication list given to you today.  *If you need a refill on your cardiac medications before your next appointment, please call your pharmacy*     Follow-Up: At [facility], you and your health needs are our priority.  As part of our continuing mission to provide you with exceptional heart care, we have created designated Provider Care Teams.  These Care Teams include your primary Cardiologist (physician) and Advanced Practice Providers (APPs -  Physician Assistants and Nurse Practitioners) who all work together to provide you with the care you need, when you need it.  We recommend signing up for the patient portal called "MyChart".  Sign up information is provided on this After Visit Summary.  MyChart is used to connect with patients for Virtual Visits (Telemedicine).  Patients are able to view lab/test results, encounter notes, upcoming appointments, etc.  Non-urgent messages can be sent to your provider as well.   To learn more about what you can do with MyChart, go to [email or web address].    Your next appointment:   1 year(s)  Provider:   Dr. Odis Hollingshead

## 2023-03-18 DIAGNOSIS — L816 Other disorders of diminished melanin formation: Secondary | ICD-10-CM | POA: Diagnosis not present

## 2023-04-03 ENCOUNTER — Other Ambulatory Visit: Payer: Self-pay | Admitting: Internal Medicine

## 2023-04-03 DIAGNOSIS — Z1231 Encounter for screening mammogram for malignant neoplasm of breast: Secondary | ICD-10-CM

## 2023-05-12 DIAGNOSIS — H40153 Residual stage of open-angle glaucoma, bilateral: Secondary | ICD-10-CM | POA: Diagnosis not present

## 2023-05-19 ENCOUNTER — Ambulatory Visit
Admission: RE | Admit: 2023-05-19 | Discharge: 2023-05-19 | Disposition: A | Discharge status: Home / Self Care | Payer: Medicare Other | Source: Ambulatory Visit | Attending: Internal Medicine | Admitting: Internal Medicine

## 2023-05-19 DIAGNOSIS — Z1231 Encounter for screening mammogram for malignant neoplasm of breast: Secondary | ICD-10-CM | POA: Diagnosis not present

## 2023-05-21 DIAGNOSIS — L816 Other disorders of diminished melanin formation: Secondary | ICD-10-CM | POA: Diagnosis not present

## 2023-05-26 ENCOUNTER — Telehealth: Payer: Self-pay | Admitting: Family Medicine

## 2023-05-26 NOTE — Telephone Encounter (Signed)
 Faxed to [facility] as requested

## 2023-05-26 NOTE — Telephone Encounter (Signed)
 [facility] requesting office visit notes, prescription and sleep. Study fax to [phone].

## 2023-05-27 DIAGNOSIS — R7301 Impaired fasting glucose: Secondary | ICD-10-CM | POA: Diagnosis not present

## 2023-05-27 DIAGNOSIS — E785 Hyperlipidemia, unspecified: Secondary | ICD-10-CM | POA: Diagnosis not present

## 2023-05-27 DIAGNOSIS — I471 Supraventricular tachycardia, unspecified: Secondary | ICD-10-CM | POA: Diagnosis not present

## 2023-05-27 DIAGNOSIS — K219 Gastro-esophageal reflux disease without esophagitis: Secondary | ICD-10-CM | POA: Diagnosis not present

## 2023-05-27 DIAGNOSIS — H409 Unspecified glaucoma: Secondary | ICD-10-CM | POA: Diagnosis not present

## 2023-05-27 DIAGNOSIS — G4730 Sleep apnea, unspecified: Secondary | ICD-10-CM | POA: Diagnosis not present

## 2023-05-27 DIAGNOSIS — G894 Chronic pain syndrome: Secondary | ICD-10-CM | POA: Diagnosis not present

## 2023-05-27 DIAGNOSIS — K828 Other specified diseases of gallbladder: Secondary | ICD-10-CM | POA: Diagnosis not present

## 2023-05-27 DIAGNOSIS — I700 Atherosclerosis of aorta: Secondary | ICD-10-CM | POA: Diagnosis not present

## 2023-05-27 DIAGNOSIS — N6019 Diffuse cystic mastopathy of unspecified breast: Secondary | ICD-10-CM | POA: Diagnosis not present

## 2023-05-29 NOTE — Telephone Encounter (Signed)
 Called [facility] back, option 2. Spoke w/ Nelle. Asked for them to re-fax form, we have not received. She will resend to [phone]. Needing most recent notes, rx for CPAP supplies.

## 2023-05-29 NOTE — Telephone Encounter (Signed)
 Spoke w/ Valli Glance. They will send fax to [phone]

## 2023-05-29 NOTE — Telephone Encounter (Signed)
 Elizabeth Barron @ [facility] called again to let us know he was faxing a prescription form over, he needs provider to sign and fax this back.

## 2023-05-29 NOTE — Telephone Encounter (Signed)
 Received fax from [place]. Order signed by Linton Rump and last OV notes faxed to [phone]. Received fax confirmation.

## 2023-07-15 NOTE — Progress Notes (Unsigned)
 Elizabeth Barron

## 2023-07-16 ENCOUNTER — Telehealth: Payer: Self-pay | Admitting: Neurology

## 2023-07-16 ENCOUNTER — Encounter: Payer: Self-pay | Admitting: Family Medicine

## 2023-07-16 ENCOUNTER — Ambulatory Visit: Payer: Medicare Other | Admitting: Family Medicine

## 2023-07-16 VITALS — BP 146/85 | HR 66 | Ht 62.0 in | Wt 219.0 lb

## 2023-07-16 DIAGNOSIS — G4733 Obstructive sleep apnea (adult) (pediatric): Secondary | ICD-10-CM | POA: Diagnosis not present

## 2023-07-16 DIAGNOSIS — G4700 Insomnia, unspecified: Secondary | ICD-10-CM | POA: Diagnosis not present

## 2023-07-16 NOTE — Telephone Encounter (Signed)
 Received message from Elizabeth Barron asking to reach out to patient to schedule a follow up with Dr. Omar Bibber in the next few months due to pt being anxious about her insomnia and wanting to see Dr. Omar Bibber. LVM asking pt to call back to schedule an appointment

## 2023-07-16 NOTE — Patient Instructions (Addendum)
 Please continue using your CPAP regularly. While your insurance requires that you use CPAP at least 4 hours each night on 70% of the nights, I recommend, that you not skip any nights and use it throughout the night if you can. Getting used to CPAP and staying with the treatment long term does take time and patience and discipline. Untreated obstructive sleep apnea when it is moderate to severe can have an adverse impact on cardiovascular health and raise her risk for heart disease, arrhythmias, hypertension, congestive heart failure, stroke and diabetes. Untreated obstructive sleep apnea causes sleep disruption, nonrestorative sleep, and sleep deprivation. This can have an impact on your day to day functioning and cause daytime sleepiness and impairment of cognitive function, memory loss, mood disturbance, and problems focussing. Using CPAP regularly can improve these symptoms.  We will update supply orders, today. Consider valerian root or ashwagandha for sleep aid. May be helpful to talk to your PCP and consider visiting a health food store for more information on potential supplements that can help with insomnia. Counseling is consider the best therapy.   Follow up in 1 year

## 2023-07-16 NOTE — Progress Notes (Signed)
 PATIENT: Elizabeth Barron DOB: 11-05-1945  REASON FOR VISIT: follow up HISTORY FROM: patient  Chief Complaint  Patient presents with   Follow-up    Pt in 1 alone Pt here for cpap f/u Pt states not sleeping during the night Pt states wakes up  3 times nightly      HISTORY OF PRESENT ILLNESS:  07/16/23 ALL: Elizabeth Barron returns for follow up for OSA on CPAP. She continues to do well on therapy. She denies concerns with her machine or supplies. She feels she wakes feeling more refreshed and is not as sleepy during the day on therapy. She also continues to have significant difficulty with insomnia. She may sleep for an hour or two at a time then wake. She feels that her brain starts thinking about different things and she can not get back to sleep easily. She estimates getting about 3-4 hours of sleep per night. She has not discussed with her primary care provider, recently. She has tried and failed multiple sleep aids including melatonin IR/ER, Tylenol  PM, benadryl , Ambien and Lunesta. Unclear if she has tried trazodone. We have discussed referral to psychology multiple times but she is not interested. She does not drink caffeine or alcohol.     07/16/2022 ALL:  Elizabeth Barron returns for follow up for OSA on CPAP. She was last seen 06/2021 and having continued difficulty meeting compliance. She has chronic insomnia and reported Tylenol  PM was helping. She reports doing a little better with daily usage but continues to have difficulty staying asleep. She feels that benadryl  quit working. She has tried and failed multiple sleep aids. Melatonin did not work. Ambien and Lunesta made her feel groggy the next day. She reports going to bed around 11:30-12a. She usually wakes around 5:30am and can not return to sleep. We have discussed CBT in the past but she was not interested. She was considering CBD but hesitant. She is followed regularly by PCP.     01/03/2022 ALL: Elizabeth Barron returns for follow up for OSA on CPAP.  She was last seen 06/2021 and had not noted any improvement in sleep quality. She continued to have difficulty initiating and maintaining sleep. We discussed sleep apnea versus insomnia. She was encouraged to continue using CPAP and advised to try melatonin extended release. Since, she reports trying melatonin IR/ER 5mg  for about a week. She did not note any benefit and tried increasing dose to 10mg  for 7-10 days. No benefit. She tried Tylenol  PM and feels it has helped some. She is getting to sleep easier but continues to wake up after a few hours. She is usually able to sleep about 4 hours. She has used CPAP fairly consistently over the past 2 weeks. She does report feeling better rested and less tired.    She had left hip replacement in May and right was done in August. Hip pain is much better. She is having more pain in the right leg.      07/09/2021 ALL: Elizabeth Barron returns for follow up for OSA on CPAP. She was last seen by me in 11/2019 and reported having difficulty with compliance. Elizabeth Elizabeth Barron saw her last 06/2020 and compliance had improved. She was having difficulty initiating and maintaining sleep and advised to start melatonin 3mg  daily with titration to 6mg  if needed. She reports that it helped her get to sleep but she continued to wake every couple of hours. She has tried multiple rx sleep aids including Ambien and Lunesta without benefit. Sleep aids make  her feel "hung over" the next day. She usually goes to bed around 12 midnight. She is usually asleep by 1-1:30. She wakes up a couple hours later and it takes her a little while to get back to sleep. She usually gets out of bed around 5:30-6am. She denies nocturia. She feels that her mind doesn't rest some days. She works a part time job working as a paralegal a few hours a week. She does chores around the house when she gets home. She may nap for about 10 minutes a few times a week. She lives with her husband.   She has not been consistent with CPAP  usage. She states that she doesn't understand the need if she is not sleeping any better using CPAP. She does not note any significant benefit of using CPAP.     07/05/2020 ALL:  Elizabeth Barron is a 78 y.o. female here today for follow up for recently diagnosed OSA started on CPAP. Sleep study in 06/2019 showed "moderate to severe obstructive sleep apnea, with a total AHI of 17.1/hour, REM AHI of 37.1/hour, and O2 nadir of 85%". CPAP titration showed adequate management of OSA at set pressure of 10cmH20. She reports that she places CPAP therapy every night. She continues to have a very difficult time getting to and staying asleep. She does feel that there has been some benefit to using CPAP. She feels she may sleep 3-4 hours each night. Sometimes only 2 hours. She does feel that sleep quality has improved since using CPAP. She wakes feeling more refreshed. She has struggled with insomnia for years. She has tried multiple sleep aids that only made her feel sluggish.   Compliance report dated 11/14/2019 3920 2021 reveals that she used CPAP 29 of the past 30 days for compliance of 97%.  She used CPAP greater than 4 hours 29 of the past 30 days for compliance of 97%.  Average usage was 5 hours and 31 minutes.  Residual AHI was 1.0 on 10 cm of water and an EPR of 3.  There is no significant leak noted.  HISTORY: (copied from Elizabeth Barron note on 07/05/2019)  Dear Elizabeth Barron,    I saw your patient, Elizabeth Barron, upon your kind request, in my Neurologic clinic today for initial consultation of her sleep disorder, in particular, concern for underlying obstructive sleep apnea.  The patient is unaccompanied today.  As you know, Elizabeth Barron is a 78 year old right-handed woman with an underlying complex medical history of arthritis, low back pain, hypothyroidism, hyperlipidemia, glaucoma, reflux disease, restless leg syndrome, and morbid obesity with a BMI of over 40, who reports chronic difficulty with sleep initiation and  sleep maintenance for many years.  She had a sleep study many years ago, maybe 10 or 15 years ago which was negative for sleep apnea at the time.  Prior sleep study results are not available for my review today.  I reviewed your office note from 06/16/2019.  Her Epworth sleepiness score is 16 out of 24.  She is sleepy during the day and sleeps during the day often.  She has a bedtime typically after midnight and sometimes after 1 AM, sometimes she sleeps better in the recliner because of less leg pain.  She is followed by [facility] management at [facility].  Her primary care physician has taken her off of her gabapentin recently in the past 2 weeks or so because of nausea.  She takes Aleve as needed.  She has tried over-the-counter  sleep aids including p.m. type medication and melatonin some years ago.  She has tried Ambien which made her groggy or drowsy during the day, she may have tried Lunesta, she is not sure if she tried Belsomra or trazodone.  She typically gets out of bed around 6 AM.  She goes to the bathroom once in the early morning hours.  She has had occasional morning headaches and has symptoms of restless legs but often she has leg pain which comes from the back.  Her sister has a diagnosis of sleep apnea but is not on a CPAP machine.  The patient is a non-smoker and does not drink alcohol and drinks no caffeine on a daily basis.  She is retired, she lives with her husband.  They have no pets at the house.  She has a TV on at night in her bedroom but typically turns it off when she is ready to fall asleep.   REVIEW OF SYSTEMS: Out of a complete 14 system review of symptoms, the patient complains only of the following symptoms, insomnia and all other reviewed systems are negative.  ESS: 7/24, previously 7/24 FSS: not completed, previously 30/63  ALLERGIES: No Known Allergies   HOME MEDICATIONS: Outpatient Medications Prior to Visit  Medication Sig Dispense Refill   aspirin  EC 81 MG tablet  Take 81 mg by mouth daily.     Cyanocobalamin (VITAMIN B 12) 500 MCG TABS Take 1,000 mcg by mouth daily.     dorzolamide (TRUSOPT) 2 % ophthalmic solution Place 1 drop into both eyes 2 (two) times daily.     esomeprazole (NEXIUM) 40 MG capsule Take 40 mg by mouth every other day.     FAMOTIDINE  PO Take by mouth every other day. Alternate with omeprazole     levothyroxine  (SYNTHROID , LEVOTHROID) 175 MCG tablet Take 175 mcg by mouth See admin instructions. Take 175 mg Monday through Thursday, 1/2 of Friday Saturday and Sunday None     Probiotic Product (ALIGN PO) Take 1 tablet by mouth 2 (two) times a week.     rosuvastatin (CRESTOR) 5 MG tablet Take 5 mg by mouth 2 (two) times a week. On Sunday and Wednesday     Vitamin D, Ergocalciferol, (DRISDOL) 50000 units CAPS capsule Take 50,000 Units by mouth every Friday.     Wheat Dextrin (BENEFIBER) POWD Take 5 mLs by mouth daily. Over meal     No facility-administered medications prior to visit.    PAST MEDICAL HISTORY: Past Medical History:  Diagnosis Date   Arthritis    knees. back   Colon polyps    GERD (gastroesophageal reflux disease)    Glaucoma    Headache    Hypercholesteremia    Hypothyroidism    thyroid  removed   Insomnia    Low back pain    Obesity    Osteoarthritis    r hip, left hip   Pre-diabetes    Restless leg syndrome    Sleep apnea    uses cpap    PAST SURGICAL HISTORY: Past Surgical History:  Procedure Laterality Date   ANTERIOR CRUCIATE LIGAMENT REPAIR     BREAST LUMPECTOMY     L breast- benign   BUNIONECTOMY Left 2012   COLONOSCOPY WITH PROPOFOL  N/A 10/11/2014   Procedure: COLONOSCOPY WITH PROPOFOL ;  Surgeon: Garrett Kallman, MD;  Location: [facility];  Service: Endoscopy;  Laterality: N/A;   HIP SURGERY Bilateral    08/17/21  10/26/21   HIP SURGERY  May and August 2023   KNEE SURGERY     bilateral scope surgery   MAXIMUM ACCESS (MAS)POSTERIOR LUMBAR INTERBODY FUSION (PLIF) 2 LEVEL N/A  12/06/2015   Procedure: Lumbar four-five, Lumbar five-Sacral one MAXIMUM ACCESS POSTERIOR LUMBAR INTERBODY FUSION   ;  Surgeon: Isadora Mar, MD;  Location: [facility];  Service: Neurosurgery;  Laterality: N/A;   RADIOLOGY WITH ANESTHESIA Left 09/05/2016   Procedure: MRI OF LEFT HIP WITHOUT;  Surgeon: Radiologist, Medication, MD;  Location: [facility] OR;  Service: Radiology;  Laterality: Left;   RADIOLOGY WITH ANESTHESIA N/A 06/05/2021   Procedure: MRI WITH CERVICAL SPINE WITHOUT CONTRAST;  Surgeon: Radiologist, Medication, MD;  Location: [facility] OR;  Service: Radiology;  Laterality: N/A;   THYROIDECTOMY     TONSILLECTOMY  1955   child   TUBAL LIGATION     VAGINAL HYSTERECTOMY  1981    FAMILY HISTORY: Family History  Problem Relation Age of Onset   Cancer Mother        pancreatic   Diabetes Mother    Hypertension Mother    Cancer Father        esophageal   Diabetes Father    Hypertension Father    Diabetes Sister    Other Sister        dialysis   Congestive Heart Failure Sister    Sleep apnea Sister     SOCIAL HISTORY: Social History   Socioeconomic History   Marital status: Married    Spouse name: Not on file   Number of children: 1   Years of education: Not on file   Highest education level: High school graduate  Occupational History    Employer: RETIRED  Tobacco Use   Smoking status: Never   Smokeless tobacco: Never  Vaping Use   Vaping status: Never Used  Substance and Sexual Activity   Alcohol use: Yes    Comment: OCCASIONAL WINE   Drug use: No   Sexual activity: Not on file  Other Topics Concern   Not on file  Social History Narrative   Lives with spouse   caffeine none   Retired    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  Generalized: Well developed, in no acute distress  Cardiology: normal rate and rhythm, no murmur noted Respiratory: clear to auscultation bilaterally  Neurological examination  Mentation: Alert oriented to time, place, history taking. Follows all commands speech and language fluent Cranial nerve II-XII: Pupils were equal round reactive to light. Extraocular movements were full, visual field were full  Motor: The motor testing reveals 5 over 5 strength of all 4 extremities. Good symmetric motor tone is noted throughout.  Gait and station: Gait is stable with Rolator     DIAGNOSTIC DATA (LABS, IMAGING, TESTING) - I reviewed patient records, labs, notes, testing and imaging myself where available.      No data to display           Lab Results  Component Value Date   WBC 7.4 03/20/2021  HGB 14.9 03/20/2021   HCT 47.6 (H) 03/20/2021   MCV 87.3 03/20/2021   PLT 233 03/20/2021      Component Value Date/Time   NA 141 06/05/2021 0719   K 3.4 (L) 06/05/2021 0719   CL 108 06/05/2021 0719   CO2 25 06/05/2021 0719   GLUCOSE 94 06/05/2021 0719   BUN 16 06/05/2021 0719   CREATININE 0.71 06/05/2021 0719   CALCIUM 9.6 06/05/2021 0719   GFRNONAA >60 06/05/2021 0719   GFRAA >60 06/22/2019 1033   No results found for: "CHOL", "HDL", "LDLCALC", "LDLDIRECT", "TRIG", "CHOLHDL" No results found for: "HGBA1C" No results found for: "VITAMINB12" No results found for: "TSH"     ASSESSMENT AND PLAN 78 y.o. year old female  has a past medical history of Arthritis, Colon polyps, GERD (gastroesophageal reflux disease), Glaucoma, Headache, Hypercholesteremia, Hypothyroidism, Insomnia, Low back pain, Obesity, Osteoarthritis, Pre-diabetes, Restless leg syndrome, and Sleep apnea. here with     ICD-10-CM   1. OSA on CPAP  G47.33 For home  use only DME continuous positive airway pressure (CPAP)    2. Disorder of initiating and maintaining sleep  G47.00       Ricardo reports continued difficulty with chronic insomnia. She continues to wake up after about 2 hours of sleeping. She has failed multiple sleep aids. We have reviewed other supplements shown to help with insomnia including ashwagandha and valerian root. I have encouraged her to discuss these with PCP. We have discussed option of referral to psychology for CBT. She is aware she may reach out to me if she wishes to pursue. She was encouraged to use CPAP nightly for at least 4 hours. Sleep hygiene recommended. She will continue to follow-up with primary care.  Healthy lifestyle habits encouraged. She requests to see Elizabeth Elizabeth Barron for the next follow up as she has not seen her since 2021. She verbalizes understanding and agreement with this plan.   Orders Placed This Encounter  Procedures   For home use only DME continuous positive airway pressure (CPAP)    Heated Humidity with all supplies as needed    Length of Need:   Lifetime    Patient has OSA or probable OSA:   Yes    Is the patient currently using CPAP in the home:   Yes    Settings:   Other see comments    CPAP supplies needed:   Mask, headgear, cushions, filters, heated tubing and water chamber     No orders of the defined types were placed in this encounter.    I spent 30 minutes of face-to-face and non-face-to-face time with patient.  This included previsit chart review, lab review, study review, order entry, electronic health record documentation, patient education.    Terrilyn Fick, FNP-C 07/16/2023, 11:25 AM [facility] [address] [place], [place] [address] [phone]

## 2023-09-15 DIAGNOSIS — H40153 Residual stage of open-angle glaucoma, bilateral: Secondary | ICD-10-CM | POA: Diagnosis not present

## 2023-09-17 DIAGNOSIS — L816 Other disorders of diminished melanin formation: Secondary | ICD-10-CM | POA: Diagnosis not present

## 2023-10-13 DIAGNOSIS — Z780 Asymptomatic menopausal state: Secondary | ICD-10-CM | POA: Diagnosis not present

## 2023-10-30 DIAGNOSIS — L816 Other disorders of diminished melanin formation: Secondary | ICD-10-CM | POA: Diagnosis not present

## 2023-11-26 DIAGNOSIS — D3500 Benign neoplasm of unspecified adrenal gland: Secondary | ICD-10-CM | POA: Diagnosis not present

## 2023-11-26 DIAGNOSIS — D649 Anemia, unspecified: Secondary | ICD-10-CM | POA: Diagnosis not present

## 2023-11-26 DIAGNOSIS — M25531 Pain in right wrist: Secondary | ICD-10-CM | POA: Diagnosis not present

## 2023-11-26 DIAGNOSIS — E559 Vitamin D deficiency, unspecified: Secondary | ICD-10-CM | POA: Diagnosis not present

## 2023-11-26 DIAGNOSIS — G4730 Sleep apnea, unspecified: Secondary | ICD-10-CM | POA: Diagnosis not present

## 2023-11-26 DIAGNOSIS — Z23 Encounter for immunization: Secondary | ICD-10-CM | POA: Diagnosis not present

## 2023-11-26 DIAGNOSIS — G894 Chronic pain syndrome: Secondary | ICD-10-CM | POA: Diagnosis not present

## 2023-11-26 DIAGNOSIS — R7301 Impaired fasting glucose: Secondary | ICD-10-CM | POA: Diagnosis not present

## 2023-11-26 DIAGNOSIS — E785 Hyperlipidemia, unspecified: Secondary | ICD-10-CM | POA: Diagnosis not present

## 2023-11-26 DIAGNOSIS — E890 Postprocedural hypothyroidism: Secondary | ICD-10-CM | POA: Diagnosis not present

## 2023-12-04 DIAGNOSIS — H26492 Other secondary cataract, left eye: Secondary | ICD-10-CM | POA: Diagnosis not present

## 2023-12-04 DIAGNOSIS — H26493 Other secondary cataract, bilateral: Secondary | ICD-10-CM | POA: Diagnosis not present

## 2023-12-04 DIAGNOSIS — Z961 Presence of intraocular lens: Secondary | ICD-10-CM | POA: Diagnosis not present

## 2023-12-04 DIAGNOSIS — H401131 Primary open-angle glaucoma, bilateral, mild stage: Secondary | ICD-10-CM | POA: Diagnosis not present

## 2023-12-04 DIAGNOSIS — H18413 Arcus senilis, bilateral: Secondary | ICD-10-CM | POA: Diagnosis not present

## 2024-01-01 DIAGNOSIS — H26491 Other secondary cataract, right eye: Secondary | ICD-10-CM | POA: Diagnosis not present

## 2024-01-14 ENCOUNTER — Telehealth: Payer: Self-pay | Admitting: *Deleted

## 2024-01-14 NOTE — Telephone Encounter (Signed)
 Received fax from [facility] requesting supporting documents for CPAP supplies. Sent copy of OV note, supplies order and sleep study to them as requested at: [email or web address]

## 2024-01-19 DIAGNOSIS — M19012 Primary osteoarthritis, left shoulder: Secondary | ICD-10-CM | POA: Diagnosis not present

## 2024-04-05 ENCOUNTER — Other Ambulatory Visit: Payer: Self-pay | Admitting: Internal Medicine

## 2024-04-05 DIAGNOSIS — Z1231 Encounter for screening mammogram for malignant neoplasm of breast: Secondary | ICD-10-CM

## 2024-05-19 ENCOUNTER — Ambulatory Visit

## 2024-07-15 ENCOUNTER — Ambulatory Visit: Admitting: Neurology
